# Patient Record
Sex: Female | Born: 1975 | Race: Black or African American | Hispanic: No | Marital: Married | Smoking: Never smoker
Health system: Southern US, Community
[De-identification: ages and names within clinical notes are randomized; demographics above are authoritative.]

## PROBLEM LIST (undated history)

## (undated) DIAGNOSIS — G500 Trigeminal neuralgia: Secondary | ICD-10-CM

## (undated) DIAGNOSIS — M1990 Unspecified osteoarthritis, unspecified site: Secondary | ICD-10-CM

## (undated) DIAGNOSIS — R51 Headache: Secondary | ICD-10-CM

## (undated) DIAGNOSIS — K8020 Calculus of gallbladder without cholecystitis without obstruction: Secondary | ICD-10-CM

## (undated) DIAGNOSIS — G4730 Sleep apnea, unspecified: Secondary | ICD-10-CM

## (undated) HISTORY — PX: ABDOMINAL HYSTERECTOMY: SHX81

## (undated) HISTORY — PX: TONSILLECTOMY: SUR1361

## (undated) HISTORY — PX: BRAIN SURGERY: SHX531

## (undated) HISTORY — PX: BREAST REDUCTION SURGERY: SHX8

---

## 2003-08-06 ENCOUNTER — Emergency Department (HOSPITAL_COMMUNITY): Admission: EM | Admit: 2003-08-06 | Discharge: 2003-08-06 | Payer: Self-pay | Admitting: Emergency Medicine

## 2004-12-15 ENCOUNTER — Emergency Department (HOSPITAL_COMMUNITY): Admission: EM | Admit: 2004-12-15 | Discharge: 2004-12-15 | Payer: Self-pay | Admitting: Family Medicine

## 2004-12-21 ENCOUNTER — Emergency Department (HOSPITAL_COMMUNITY): Admission: EM | Admit: 2004-12-21 | Discharge: 2004-12-21 | Payer: Self-pay | Admitting: Family Medicine

## 2005-05-01 ENCOUNTER — Encounter: Admission: RE | Admit: 2005-05-01 | Discharge: 2005-05-01 | Payer: Self-pay | Admitting: Internal Medicine

## 2006-12-01 ENCOUNTER — Emergency Department (HOSPITAL_COMMUNITY): Admission: EM | Admit: 2006-12-01 | Discharge: 2006-12-01 | Payer: Self-pay | Admitting: Family Medicine

## 2007-06-28 ENCOUNTER — Emergency Department (HOSPITAL_COMMUNITY): Admission: EM | Admit: 2007-06-28 | Discharge: 2007-06-28 | Payer: Self-pay | Admitting: Emergency Medicine

## 2008-07-01 ENCOUNTER — Emergency Department (HOSPITAL_COMMUNITY): Admission: EM | Admit: 2008-07-01 | Discharge: 2008-07-01 | Payer: Self-pay | Admitting: Emergency Medicine

## 2009-01-29 ENCOUNTER — Emergency Department (HOSPITAL_COMMUNITY): Admission: EM | Admit: 2009-01-29 | Discharge: 2009-01-30 | Payer: Self-pay | Admitting: Emergency Medicine

## 2009-02-19 ENCOUNTER — Emergency Department (HOSPITAL_COMMUNITY): Admission: EM | Admit: 2009-02-19 | Discharge: 2009-02-19 | Payer: Self-pay | Admitting: Emergency Medicine

## 2011-01-08 ENCOUNTER — Emergency Department (HOSPITAL_COMMUNITY)
Admission: EM | Admit: 2011-01-08 | Discharge: 2011-01-08 | Disposition: A | Discharge status: Home / Self Care | Payer: 59 | Attending: Emergency Medicine | Admitting: Emergency Medicine

## 2011-01-08 DIAGNOSIS — R51 Headache: Secondary | ICD-10-CM | POA: Insufficient documentation

## 2011-01-08 DIAGNOSIS — R4789 Other speech disturbances: Secondary | ICD-10-CM | POA: Insufficient documentation

## 2011-05-14 LAB — BASIC METABOLIC PANEL
CO2: 29
Chloride: 108
Potassium: 3.8
Sodium: 141

## 2011-05-14 LAB — DIFFERENTIAL
Basophils Absolute: 0
Basophils Relative: 0
Eosinophils Relative: 3
Lymphocytes Relative: 29
Lymphs Abs: 2.4
Neutro Abs: 5.5

## 2011-05-14 LAB — CBC
MCV: 81.4
Platelets: 313
WBC: 8.5

## 2011-05-14 LAB — POCT CARDIAC MARKERS: Troponin i, poc: <0.05

## 2012-08-27 ENCOUNTER — Emergency Department (HOSPITAL_COMMUNITY): Payer: 59

## 2012-08-27 ENCOUNTER — Encounter (HOSPITAL_COMMUNITY): Payer: Self-pay | Admitting: Emergency Medicine

## 2012-08-27 ENCOUNTER — Emergency Department (HOSPITAL_COMMUNITY)
Admission: EM | Admit: 2012-08-27 | Discharge: 2012-08-27 | Disposition: A | Discharge status: Home / Self Care | Payer: 59 | Attending: Emergency Medicine | Admitting: Emergency Medicine

## 2012-08-27 DIAGNOSIS — Z8669 Personal history of other diseases of the nervous system and sense organs: Secondary | ICD-10-CM | POA: Insufficient documentation

## 2012-08-27 DIAGNOSIS — R05 Cough: Secondary | ICD-10-CM | POA: Insufficient documentation

## 2012-08-27 DIAGNOSIS — B349 Viral infection, unspecified: Secondary | ICD-10-CM

## 2012-08-27 DIAGNOSIS — B9789 Other viral agents as the cause of diseases classified elsewhere: Secondary | ICD-10-CM | POA: Insufficient documentation

## 2012-08-27 DIAGNOSIS — J3489 Other specified disorders of nose and nasal sinuses: Secondary | ICD-10-CM | POA: Insufficient documentation

## 2012-08-27 DIAGNOSIS — R6889 Other general symptoms and signs: Secondary | ICD-10-CM | POA: Insufficient documentation

## 2012-08-27 DIAGNOSIS — J029 Acute pharyngitis, unspecified: Secondary | ICD-10-CM | POA: Insufficient documentation

## 2012-08-27 DIAGNOSIS — IMO0001 Reserved for inherently not codable concepts without codable children: Secondary | ICD-10-CM | POA: Insufficient documentation

## 2012-08-27 DIAGNOSIS — R059 Cough, unspecified: Secondary | ICD-10-CM | POA: Insufficient documentation

## 2012-08-27 HISTORY — DX: Trigeminal neuralgia: G50.0

## 2012-08-27 MED ORDER — ACETAMINOPHEN 500 MG PO TABS
1000.0000 mg | ORAL_TABLET | Freq: Once | ORAL | Status: AC
  Administered 2012-08-27: 1000 mg via ORAL
  Filled 2012-08-27: qty 2

## 2012-08-27 MED ORDER — HYDROCODONE-ACETAMINOPHEN 7.5-500 MG/15ML PO SOLN: 15.0000 mL | ORAL | Status: DC | PRN

## 2012-08-27 MED ORDER — HYDROMORPHONE HCL PF 1 MG/ML IJ SOLN
1.0000 mg | Freq: Once | INTRAMUSCULAR | Status: AC
  Administered 2012-08-27: 1 mg via INTRAMUSCULAR
  Filled 2012-08-27: qty 1

## 2012-08-27 MED ORDER — IBUPROFEN 200 MG PO TABS
400.0000 mg | ORAL_TABLET | Freq: Once | ORAL | Status: AC
  Administered 2012-08-27: 400 mg via ORAL
  Filled 2012-08-27: qty 2

## 2012-08-27 MED ORDER — DEXAMETHASONE 10 MG/ML FOR PEDIATRIC ORAL USE
10.0000 mg | Freq: Once | INTRAMUSCULAR | Status: DC
  Filled 2012-08-27: qty 1

## 2012-08-27 MED ORDER — DEXAMETHASONE 6 MG PO TABS
10.0000 mg | ORAL_TABLET | Freq: Once | ORAL | Status: AC
  Administered 2012-08-27: 10 mg via ORAL
  Filled 2012-08-27: qty 1

## 2012-08-27 NOTE — ED Notes (Signed)
States that she has had a cold for the past 2 weeks. States that her throat is hurting today and feels as though it is closed. States that she started having body aches and chills 2 days ago.

## 2012-08-27 NOTE — ED Provider Notes (Signed)
History     CSN: [id number]  Arrival date & time 08/27/12  1617   First MD Initiated Contact with Patient 08/27/12 1702      Chief Complaint  Patient presents with  . Influenza     HPI Pt was seen at 1705.   Per pt, c/o gradual onset and persistence of constant runny/stuffy nose, sinus congestion, and cough for the past 2 weeks.  Has now been associated with sore throat, generalized body aches/fatigue, and home fevers to "103" for the past 2 days.  Denies rash, no CP/SOB, no N/V/D, no abd pain.    Did not receive flu shot this year Past Medical History  Diagnosis Date  . Trigeminal neuralgia     Past Surgical History  Procedure Date  . Abdominal hysterectomy   . Breast reduction surgery      History  Substance Use Topics  . Smoking status: Never Smoker   . Smokeless tobacco: Not on file  . Alcohol Use: No    Review of Systems ROS: Statement: All systems negative except as marked or noted in the HPI; Constitutional: +fever and chills, generalized body aches/fatigue. ; ; Eyes: Negative for eye pain, redness and discharge. ; ; ENMT: Negative for ear pain, hoarseness, +nasal congestion, sinus pressure and sore throat. ; ; Cardiovascular: Negative for chest pain, palpitations, diaphoresis, dyspnea and peripheral edema. ; ; Respiratory: +cough. Negative for wheezing and stridor. ; ; Gastrointestinal: Negative for nausea, vomiting, diarrhea, abdominal pain, blood in stool, hematemesis, jaundice and rectal bleeding. . ; ; Genitourinary: Negative for dysuria, flank pain and hematuria. ; ; Musculoskeletal: Negative for back pain and neck pain. Negative for swelling and trauma.; ; Skin: Negative for pruritus, rash, abrasions, blisters, bruising and skin lesion.; ; Neuro: Negative for headache, lightheadedness and neck stiffness. Negative for weakness, altered level of consciousness , altered mental status, extremity weakness, paresthesias, involuntary movement, seizure and syncope.        Allergies  Review of patient's allergies indicates no known allergies.  Home Medications   Current Outpatient Rx  Name  Route  Sig  Dispense  Refill  . ACETAMINOPHEN 500 MG PO TABS   Oral   Take 1,000 mg by mouth every 6 (six) hours as needed. Pain.         Marland Kitchen DEXTROMETHORPHAN POLISTIREX ER 30 MG/5ML PO LQCR   Oral   Take 60 mg by mouth as needed. Cough.         Marland Kitchen PHENYLEPHRINE-DM-GG-APAP 5-10-200-325 MG/10ML PO LIQD   Oral   Take 30 mLs by mouth 4 (four) times daily as needed. Cold/flu symptoms.         Marland Kitchen PSEUDOEPH-DOXYLAMINE-DM-APAP 60-7.01-01-999 MG/30ML PO LIQD   Oral   Take 30 mLs by mouth daily as needed. Cold symptoms.           BP 144/74  Pulse 99  Temp 98.5 F (36.9 C) (Oral)  Resp 19  SpO2 100%  Physical Exam 1710: Physical examination:  Nursing notes reviewed; Vital signs and O2 SAT reviewed;  Constitutional: Well developed, Well nourished, Well hydrated, In no acute distress; Head:  Normocephalic, atraumatic; Eyes: EOMI, PERRL, No scleral icterus; ENMT: TM's clear bilat. +edemetous nasal turbinates bilat with clear rhinorrhea.  +mild post pharyngeal erythema. No exudates. Mouth and pharynx without lesions. No intra-oral edema. No hoarse voice, no drooling, no stridor. No pain with manipulation of larynx. Mucous membranes moist; Neck: Supple, Full range of motion, No lymphadenopathy; Cardiovascular: Regular rate and rhythm, No murmur,  rub, or gallop; Respiratory: Breath sounds clear & equal bilaterally, No rales, rhonchi, wheezes.  Speaking full sentences with ease, Normal respiratory effort/excursion; Chest: Nontender, Movement normal; Abdomen: Soft, Nontender, Nondistended, Normal bowel sounds; Genitourinary: No CVA tenderness; Extremities: Pulses normal, No tenderness, No edema, No calf edema or asymmetry.; Neuro: AA&Ox3, Major CN grossly intact.  Speech clear. Climbs on and off stretcher easily by herself. Gait steady. No gross focal motor or sensory  deficits in extremities.; Skin: Color normal, Warm, Dry.   ED Course  Procedures     MDM  MDM Reviewed: nursing note and vitals Interpretation: labs and x-ray   Results for orders placed during the hospital encounter of 08/27/12  RAPID STREP SCREEN      Component Value Range   Streptococcus, Group A Screen (Direct) NEGATIVE  NEGATIVE   Dg Chest 2 View 08/27/2012  *RADIOLOGY REPORT*  Clinical Data: Pharyngitis.  Myalgias.  Headaches.  Neck pain.  CHEST - 2 VIEW  Comparison: 06/28/2007.  Findings: The heart remains normal in size and the lungs are clear. Mild diffuse peribronchial thickening is currently demonstrated. Interval bilateral breast reductions.  Unremarkable bones.  IMPRESSION: Interval mild bronchitic changes.   Original Report Authenticated By: Beckie Salts, M.D.     [phone]:   Appears viral illness at this time; will tx symptomatically. Dx and testing d/w pt.  Questions answered.  Verb understanding, agreeable to d/c home with outpt f/u.          Laray Anger, DO 08/29/12 2143

## 2012-08-30 ENCOUNTER — Emergency Department (HOSPITAL_COMMUNITY)
Admission: EM | Admit: 2012-08-30 | Discharge: 2012-08-30 | Disposition: A | Discharge status: Home / Self Care | Payer: 59 | Attending: Emergency Medicine | Admitting: Emergency Medicine

## 2012-08-30 ENCOUNTER — Encounter (HOSPITAL_COMMUNITY): Payer: Self-pay | Admitting: Emergency Medicine

## 2012-08-30 DIAGNOSIS — J029 Acute pharyngitis, unspecified: Secondary | ICD-10-CM

## 2012-08-30 DIAGNOSIS — R509 Fever, unspecified: Secondary | ICD-10-CM | POA: Insufficient documentation

## 2012-08-30 DIAGNOSIS — G500 Trigeminal neuralgia: Secondary | ICD-10-CM | POA: Insufficient documentation

## 2012-08-30 MED ORDER — DEXAMETHASONE 6 MG PO TABS
12.0000 mg | ORAL_TABLET | Freq: Once | ORAL | Status: AC
  Administered 2012-08-30: 12 mg via ORAL
  Filled 2012-08-30: qty 2

## 2012-08-30 MED ORDER — TRAMADOL HCL 50 MG PO TABS: 50.0000 mg | ORAL_TABLET | Freq: Four times a day (QID) | ORAL | Status: DC | PRN

## 2012-08-30 MED ORDER — IBUPROFEN 200 MG PO TABS
600.0000 mg | ORAL_TABLET | Freq: Once | ORAL | Status: AC
  Administered 2012-08-30: 600 mg via ORAL
  Filled 2012-08-30: qty 3

## 2012-08-30 NOTE — ED Notes (Signed)
Pt reports increased URI symptoms over last two weeks. C/o sore throat, ear pain, chills and fever. Feels like throat is closing over last few days.

## 2012-09-04 ENCOUNTER — Encounter: Payer: Self-pay | Admitting: Physician Assistant

## 2012-09-04 ENCOUNTER — Ambulatory Visit: Payer: 59 | Admitting: Physician Assistant

## 2012-09-04 VITALS — BP 154/88 | HR 129 | Temp 99.3°F | Resp 17 | Ht 67.5 in | Wt >= 6400 oz

## 2012-09-04 DIAGNOSIS — R509 Fever, unspecified: Secondary | ICD-10-CM

## 2012-09-04 DIAGNOSIS — J029 Acute pharyngitis, unspecified: Secondary | ICD-10-CM

## 2012-09-04 LAB — POCT CBC
MCHC: 30.7 g/dL — AB (ref 31.8–35.4)
POC Granulocyte: 8 — AB (ref 2–6.9)
POC LYMPH PERCENT: 30.2 %L (ref 10–50)
POC MID %: 5.4 %M (ref 0–12)
WBC: 12.6 10*3/uL — AB (ref 4.6–10.2)

## 2012-09-04 LAB — POCT RAPID STREP A (OFFICE): Rapid Strep A Screen: NEGATIVE

## 2012-09-04 MED ORDER — FIRST-DUKES MOUTHWASH MT SUSP: 10.0000 mL | OROMUCOSAL | Status: DC | PRN

## 2012-09-04 MED ORDER — AMOXICILLIN 875 MG PO TABS: 875.0000 mg | ORAL_TABLET | Freq: Two times a day (BID) | ORAL | Status: DC

## 2012-09-04 MED ORDER — IPRATROPIUM BROMIDE 0.03 % NA SOLN: 2.0000 | Freq: Two times a day (BID) | NASAL | Status: DC

## 2012-09-04 MED ORDER — GUAIFENESIN ER 1200 MG PO TB12: 1.0000 | ORAL_TABLET | Freq: Two times a day (BID) | ORAL | Status: DC | PRN

## 2012-09-04 MED ORDER — CETIRIZINE HCL 10 MG PO TABS: 10.0000 mg | ORAL_TABLET | Freq: Every day | ORAL | Status: DC

## 2012-09-04 NOTE — Progress Notes (Signed)
Subjective:    Patient ID: Phyllis Calhoun, female    DOB: 1976/04/07, 37 y.o.   MRN: [id number]  HPI   Ms. Phyllis Calhoun is a 37 yr old female who presents with sore throat and a sensation of the throat closing.  States this has been going on for about 2 weeks.  Was seen at [facility] ED for these symptoms on 08/27/12.  Note reviewed.  At that time diagnosed with viral illness, possibly flu, though flu test not done.  Rapid strep negative.  Pt states that she has not improved and is in fact probably worsening.  States the throat feels like it's closing, especially at night.  Gagging because she feels like she can't breathe.  She denies PND.  Endorses SOB and wheezing.  Swallowing is painful but not difficult.  Denies drooling.  Does endorse change in voice.  States she has had fevers at home.  Tmax 4 days ago 102.13F, doesnt't think she's had fevers since that time.  States that her other symptoms of runny nose and ear pain have resolved.  Denies cough.  Denies GI or urinary symptoms.      Review of Systems  Constitutional: Positive for fever.  HENT: Positive for sore throat, neck pain and voice change. Negative for drooling, trouble swallowing, postnasal drip and sinus pressure.   Respiratory: Positive for shortness of breath and wheezing. Negative for cough.   Cardiovascular: Negative.   Gastrointestinal: Negative.   Skin: Negative.   Neurological: Negative.        Objective:   Physical Exam  Vitals reviewed. Constitutional: She is oriented to person, place, and time. She appears well-developed and well-nourished. No distress.  HENT:  Head: Normocephalic and atraumatic. No trismus in the jaw.  Right Ear: Tympanic membrane and ear canal normal.  Left Ear: Tympanic membrane and ear canal normal.  Nose: Mucosal edema present. Right sinus exhibits no maxillary sinus tenderness and no frontal sinus tenderness. Left sinus exhibits no maxillary sinus tenderness and no frontal sinus tenderness.   Mouth/Throat: Uvula is midline and mucous membranes are normal. No uvula swelling. No oropharyngeal exudate, posterior oropharyngeal edema, posterior oropharyngeal erythema or tonsillar abscesses.       Copious post-nasal drainage visible on exam; no erythema, edema, or exudate; no PTA  Neck: Neck supple.  Cardiovascular: Regular rhythm and normal heart sounds.  Tachycardia present.  Exam reveals no gallop and no friction rub.   No murmur heard. Pulmonary/Chest: Effort normal and breath sounds normal. She has no decreased breath sounds. She has no wheezes. She has no rales.  Abdominal: Soft. Bowel sounds are normal. There is no tenderness. There is no rebound and no guarding.  Lymphadenopathy:    She has no cervical adenopathy.  Neurological: She is alert and oriented to person, place, and time.  Skin: Skin is warm and dry.  Psychiatric: She has a normal mood and affect. Her behavior is normal.      Filed Vitals:   09/04/12 1506  BP: 154/88  Pulse: 129  Temp: 99.3 F (37.4 C)  Resp: 17      Results for orders placed in visit on 09/04/12  POCT CBC      Component Value Range   WBC 12.6 (*) 4.6 - 10.2 K/uL   Lymph, poc 3.8 (*) 0.6 - 3.4   POC LYMPH PERCENT 30.2  10 - 50 %L   MID (cbc) 0.7  0 - 0.9   POC MID % 5.4  0 - 12 %M  POC Granulocyte 8.0 (*) 2 - 6.9   Granulocyte percent 64.4  37 - 80 %G   RBC 4.64  4.04 - 5.48 M/uL   Hemoglobin 12.2  12.2 - 16.2 g/dL   HCT, POC 16.1  09.6 - 47.9 %   MCV 85.7  80 - 97 fL   MCH, POC 26.3 (*) 27 - 31.2 pg   MCHC 30.7 (*) 31.8 - 35.4 g/dL   RDW, POC 04.5     Platelet Count, POC 421  142 - 424 K/uL   MPV 9.3  0 - 99.8 fL  POCT RAPID STREP A (OFFICE)      Component Value Range   Rapid Strep A Screen Negative  Negative       Assessment & Plan:   1. Acute pharyngitis  POCT CBC, POCT rapid strep A, Culture, Group A Strep, amoxicillin (AMOXIL) 875 MG tablet, Diphenhyd-Hydrocort-Nystatin (FIRST-DUKES MOUTHWASH) SUSP, Guaifenesin  (MUCINEX MAXIMUM STRENGTH) 1200 MG TB12, ipratropium (ATROVENT) 0.03 % nasal spray, cetirizine (ZYRTEC) 10 MG tablet  2. Fever  amoxicillin (AMOXIL) 875 MG tablet     Ms. Phyllis Calhoun is a 37 yr old female here with pharyngitis.  Temp is 99.98F currently, CBC shows a slightly elevated white count at 12.6.  Rapid strep is negative.  Throat culture sent.  On exam, the throat is remarkable only for post-nasal drainage.  Suspect that PND is contributing to her sensation of throat closing and gagging.  Will try Atrovent and Zyrtec for PND.  Magic Mouthwash and ibuprofen for throat pain.  Plain mucinex.  Encouraged her to discontinue decongestants as she has an elevated BP today.  Given her fevers and slight leukocytosis, will go ahead and cover her with amoxicillin for 10 days.  Discussed plan with pt who understands and is in agreement.  Discussed RTC precautions.

## 2012-09-04 NOTE — Patient Instructions (Addendum)
Begin taking the amoxicillin as directed.  Take with food to reduce stomach upset.  Be sure to finish the full course.  I will let you know what your throat culture reveals.  Use Magic Mouth was as frequently as every two hours for sore throat.  Also take 600mg  ibuprofen ever 8 hours for sore throat.  STOP taking the Mucinex cold prep that you have at home.  Start taking the mucinex that I prescribed.  Begin taking cetirizine daily - this will help with the drainage at the back of your throat.  Also begin using Atrovent nasal spray twice daily to help with the drainage and nasal congestion.  Please let us know if you are worsening or not improving.

## 2012-09-06 LAB — CULTURE, GROUP A STREP

## 2012-09-08 NOTE — ED Provider Notes (Signed)
History    37 year-old female with sore throat. Patient has been feeling "bad" for the past 2 weeks, but symptoms have worsened the last few days. Subjective fever. Bilaterally her pain, left worse than right. Recent ER evaluation for same. No chest pain or shortness of breath. No nausea or vomiting. No sick contacts. CSN: [id number]  Arrival date & time 08/30/12  5284   First MD Initiated Contact with Patient 08/30/12 [phone]      Chief Complaint  Patient presents with  . Sore Throat    pt reports pressure and pain in throat  . Fever    fever has increased to 103 over last few days  . Chills    (Consider location/radiation/quality/duration/timing/severity/associated sxs/prior treatment) HPI  Past Medical History  Diagnosis Date  . Trigeminal neuralgia     Past Surgical History  Procedure Date  . Abdominal hysterectomy   . Breast reduction surgery     No family history on file.  History  Substance Use Topics  . Smoking status: Never Smoker   . Smokeless tobacco: Not on file  . Alcohol Use: No    OB History    Grav Para Term Preterm Abortions TAB SAB Ect Mult Living                  Review of Systems  All systems reviewed and negative, other than as noted in HPI.   Allergies  Review of patient's allergies indicates no known allergies.  Home Medications   Current Outpatient Rx  Name  Route  Sig  Dispense  Refill  . ACETAMINOPHEN 500 MG PO TABS   Oral   Take 1,000 mg by mouth every 6 (six) hours as needed. Pain.         Marland Kitchen AMOXICILLIN 875 MG PO TABS   Oral   Take 1 tablet (875 mg total) by mouth 2 (two) times daily.   20 tablet   0   . CETIRIZINE HCL 10 MG PO TABS   Oral   Take 1 tablet (10 mg total) by mouth daily.   30 tablet   11   . FIRST-DUKES MOUTHWASH MT SUSP   Oral   Take 10 mLs by mouth every 2 (two) hours as needed. With 1:1 ratio viscous lidocaine   360 mL   0   . GUAIFENESIN ER 1200 MG PO TB12   Oral   Take 1 tablet (1,200 mg  total) by mouth every 12 (twelve) hours as needed.   14 tablet   1   . IPRATROPIUM BROMIDE 0.03 % NA SOLN   Nasal   Place 2 sprays into the nose 2 (two) times daily.   30 mL   1   . PHENYLEPHRINE-DM-GG-APAP 5-10-200-325 MG/10ML PO LIQD   Oral   Take 30 mLs by mouth 4 (four) times daily as needed. Cold/flu symptoms.           BP 113/69  Pulse 91  Temp 99.6 F (37.6 C) (Oral)  Resp 18  SpO2 98%  Physical Exam  Nursing note and vitals reviewed. Constitutional: She appears well-developed and well-nourished. No distress.  HENT:  Head: Normocephalic and atraumatic.       Pharyngitis. Uvula is midline. Handling secretions. Normal phonation. No tongue elevation. Submental tissues are soft. Neck supple. No adenopathy. No stridor  Eyes: Conjunctivae normal are normal. Right eye exhibits no discharge. Left eye exhibits no discharge.  Neck: Neck supple.  Cardiovascular: Normal rate, regular rhythm and normal heart  sounds.  Exam reveals no gallop and no friction rub.   No murmur heard. Pulmonary/Chest: Effort normal and breath sounds normal. No respiratory distress.  Abdominal: Soft. She exhibits no distension. There is no tenderness.  Musculoskeletal: She exhibits no edema and no tenderness.  Neurological: She is alert.  Skin: Skin is warm and dry.  Psychiatric: She has a normal mood and affect. Her behavior is normal. Thought content normal.    ED Course  Procedures (including critical care time)  Labs Reviewed - No data to display No results found.   1. Viral pharyngitis       MDM  37 year old female with a sore throat. Likely viral. Patient with only one out of 4 centor criteria to include his subjective fever. She is afebrile in the Ed though. No evidence of deep space head or neck infection. Plan symptomatic treatment. Emergent return precautions were discussed.        Raeford Razor, MD 09/08/12 [phone]

## 2012-09-11 ENCOUNTER — Telehealth: Payer: Self-pay | Admitting: Family Medicine

## 2012-09-11 MED ORDER — FLUCONAZOLE 150 MG PO TABS: 150.0000 mg | ORAL_TABLET | Freq: Once | ORAL | Status: DC

## 2012-09-11 NOTE — Telephone Encounter (Signed)
Done, sent to pharmacy

## 2012-09-11 NOTE — Telephone Encounter (Signed)
Patient would like an RX for yeast infection that she developed on Tuesday she started her antibiotic Amoxicillin on Friday had diarrhea on Monday and got a yeast infection on Tuesday please call [facility] on [address] for RX she also wants to know if its normal to have diarrhea i told her that one of the symptoms while on Amox please call her at [phone]

## 2012-09-11 NOTE — Telephone Encounter (Signed)
Patient called back because she want to know what should she do about the Diarrhea that she is having it started on Sunday and its watery only have abdominal pain when she has to go she goes to the bathroom about 4-5 times a  Per Sarah day she started her Amox. On Friday per sarah She can stay on Amox if she can tolerate it and take some Immodium

## 2012-10-11 ENCOUNTER — Emergency Department (HOSPITAL_COMMUNITY): Payer: 59

## 2012-10-11 ENCOUNTER — Encounter (HOSPITAL_COMMUNITY): Payer: Self-pay | Admitting: Emergency Medicine

## 2012-10-11 ENCOUNTER — Emergency Department (HOSPITAL_COMMUNITY)
Admission: EM | Admit: 2012-10-11 | Discharge: 2012-10-11 | Disposition: A | Discharge status: Home / Self Care | Payer: 59 | Attending: Emergency Medicine | Admitting: Emergency Medicine

## 2012-10-11 DIAGNOSIS — IMO0002 Reserved for concepts with insufficient information to code with codable children: Secondary | ICD-10-CM | POA: Insufficient documentation

## 2012-10-11 DIAGNOSIS — Y92009 Unspecified place in unspecified non-institutional (private) residence as the place of occurrence of the external cause: Secondary | ICD-10-CM | POA: Insufficient documentation

## 2012-10-11 DIAGNOSIS — Z8669 Personal history of other diseases of the nervous system and sense organs: Secondary | ICD-10-CM | POA: Insufficient documentation

## 2012-10-11 DIAGNOSIS — T148XXA Other injury of unspecified body region, initial encounter: Secondary | ICD-10-CM

## 2012-10-11 DIAGNOSIS — X58XXXA Exposure to other specified factors, initial encounter: Secondary | ICD-10-CM | POA: Insufficient documentation

## 2012-10-11 DIAGNOSIS — Y939 Activity, unspecified: Secondary | ICD-10-CM | POA: Insufficient documentation

## 2012-10-11 LAB — BASIC METABOLIC PANEL
BUN: 11 mg/dL (ref 6–23)
GFR calc non Af Amer: >90 mL/min (ref >90)
Glucose, Bld: 103 mg/dL — ABNORMAL HIGH (ref 70–99)

## 2012-10-11 LAB — CBC WITH DIFFERENTIAL/PLATELET
Basophils Relative: 1 % (ref 0–1)
HCT: 37.1 % (ref 36.0–46.0)
Lymphs Abs: 3.1 10*3/uL (ref 0.7–4.0)
MCH: 27.1 pg (ref 26.0–34.0)
Monocytes Relative: 5 % (ref 3–12)
Neutrophils Relative %: 52 % (ref 43–77)
Platelets: 302 10*3/uL (ref 150–400)
RBC: 4.43 MIL/uL (ref 3.87–5.11)
RDW: 16.1 % — ABNORMAL HIGH (ref 11.5–15.5)

## 2012-10-11 LAB — URINALYSIS, ROUTINE W REFLEX MICROSCOPIC
Ketones, ur: NEGATIVE mg/dL
Leukocytes, UA: NEGATIVE
Protein, ur: NEGATIVE mg/dL
Urobilinogen, UA: 0.2 mg/dL (ref 0.0–1.0)
pH: 6 (ref 5.0–8.0)

## 2012-10-11 MED ORDER — IBUPROFEN 600 MG PO TABS: 600.0000 mg | ORAL_TABLET | Freq: Four times a day (QID) | ORAL | Status: DC | PRN

## 2012-10-11 MED ORDER — METHOCARBAMOL 750 MG PO TABS: 750.0000 mg | ORAL_TABLET | Freq: Four times a day (QID) | ORAL | Status: DC

## 2012-10-11 NOTE — ED Notes (Signed)
Pt alert, arrives from home, c/o right flank pain, onset was several days ago, states pain sharp, constant, denies changes in bowel or bladder, resp even unlabored, skin pwd

## 2012-10-11 NOTE — ED Provider Notes (Signed)
History     CSN: [id number]  Arrival date & time 10/11/12  0019   First MD Initiated Contact with Patient 10/11/12 0703      Chief Complaint  Patient presents with  . Flank Pain    (Consider location/radiation/quality/duration/timing/severity/associated sxs/prior treatment) Patient is a 37 y.o. female presenting with flank pain. The history is provided by the patient.  Flank Pain   patient here complaining of left-sided flank pain x1 month characterized as colicky. Symptoms are worse in the evening and better in the morning. No urinary symptoms associated with this. Denies any fever or chills. Pain is sharp and worse with certain positions as well. She denies any dyspnea or cough. Has taken no medications for this. Symptoms became worse last night  Past Medical History  Diagnosis Date  . Trigeminal neuralgia     Past Surgical History  Procedure Laterality Date  . Abdominal hysterectomy    . Breast reduction surgery      No family history on file.  History  Substance Use Topics  . Smoking status: Never Smoker   . Smokeless tobacco: Not on file  . Alcohol Use: No    OB History   Grav Para Term Preterm Abortions TAB SAB Ect Mult Living                  Review of Systems  Genitourinary: Positive for flank pain.  All other systems reviewed and are negative.    Allergies  Review of patient's allergies indicates no known allergies.  Home Medications   Current Outpatient Rx  Name  Route  Sig  Dispense  Refill  . acetaminophen (TYLENOL) 500 MG tablet   Oral   Take 1,000 mg by mouth every 6 (six) hours as needed for pain. Pain.         Marland Kitchen ibuprofen (ADVIL,MOTRIN) 200 MG tablet   Oral   Take 400 mg by mouth every 6 (six) hours as needed for pain.           BP 126/52  Pulse 66  Temp(Src) 98.1 F (36.7 C) (Oral)  Resp 18  SpO2 100%  Physical Exam  Nursing note and vitals reviewed. Constitutional: She is oriented to person, place, and time. She  appears well-developed and well-nourished.  Non-toxic appearance. No distress.  HENT:  Head: Normocephalic and atraumatic.  Eyes: Conjunctivae, EOM and lids are normal. Pupils are equal, round, and reactive to light.  Neck: Normal range of motion. Neck supple. No tracheal deviation present. No mass present.  Cardiovascular: Normal rate, regular rhythm and normal heart sounds.  Exam reveals no gallop.   No murmur heard. Pulmonary/Chest: Effort normal and breath sounds normal. No stridor. No respiratory distress. She has no decreased breath sounds. She has no wheezes. She has no rhonchi. She has no rales.  Abdominal: Soft. Normal appearance and bowel sounds are normal. She exhibits no distension. There is no tenderness. There is no rebound and no CVA tenderness.  Musculoskeletal: Normal range of motion. She exhibits no edema and no tenderness.       Arms: Neurological: She is alert and oriented to person, place, and time. She has normal strength. No cranial nerve deficit or sensory deficit. GCS eye subscore is 4. GCS verbal subscore is 5. GCS motor subscore is 6.  Skin: Skin is warm and dry. No abrasion and no rash noted.  Psychiatric: She has a normal mood and affect. Her speech is normal and behavior is normal.    ED  Course  Procedures (including critical care time)  Labs Reviewed  CBC WITH DIFFERENTIAL - Abnormal; Notable for the following:    RDW 16.1 (*)    All other components within normal limits  BASIC METABOLIC PANEL - Abnormal; Notable for the following:    Glucose, Bld 103 (*)    All other components within normal limits  URINALYSIS, ROUTINE W REFLEX MICROSCOPIC   No results found.   No diagnosis found.    MDM  Patient with likely musculoskeletal pain. No concern for PE or other serious pathology. Will place patient on pain meds and muscle relaxants        Toy Baker, MD 10/11/12 [phone]

## 2013-05-03 ENCOUNTER — Other Ambulatory Visit: Payer: Self-pay

## 2013-05-03 ENCOUNTER — Ambulatory Visit
Admission: RE | Admit: 2013-05-03 | Discharge: 2013-05-03 | Disposition: A | Discharge status: Home / Self Care | Payer: 59 | Source: Ambulatory Visit

## 2013-05-03 DIAGNOSIS — R109 Unspecified abdominal pain: Secondary | ICD-10-CM

## 2013-05-06 ENCOUNTER — Ambulatory Visit (INDEPENDENT_AMBULATORY_CARE_PROVIDER_SITE_OTHER): Payer: 59 | Admitting: General Surgery

## 2013-05-06 ENCOUNTER — Telehealth (INDEPENDENT_AMBULATORY_CARE_PROVIDER_SITE_OTHER): Payer: Self-pay | Admitting: General Surgery

## 2013-05-06 ENCOUNTER — Encounter (INDEPENDENT_AMBULATORY_CARE_PROVIDER_SITE_OTHER): Payer: Self-pay | Admitting: General Surgery

## 2013-05-06 VITALS — BP 122/88 | HR 80 | Resp 16 | Ht 67.5 in | Wt 377.2 lb

## 2013-05-06 DIAGNOSIS — K8020 Calculus of gallbladder without cholecystitis without obstruction: Secondary | ICD-10-CM

## 2013-05-06 NOTE — Telephone Encounter (Signed)
Went over pt financial responsibilities and she will call to schedule surgery. Placed in pending folder

## 2013-05-06 NOTE — Progress Notes (Signed)
Patient ID: Phyllis Calhoun, female   DOB: 02/10/1976, 37 y.o.   MRN: [id number]  No chief complaint on file.   HPI Phyllis Calhoun is a 37 y.o. female.  The patient is a 37 year old female who is referred by Dr. Kateri Plummer for evaluation of symptomatic cholelithiasis. Patient states she's had right upper quadrant pain for the last 3-4 months. They have been becoming more frequent, and the last episode lasted approximately 4-5 days. The patient said ultrasound which reveals multiple gallstones within the gallbladder. Patient labs in the ER have been normal.  HPI  Past Medical History  Diagnosis Date  . Trigeminal neuralgia     Past Surgical History  Procedure Laterality Date  . Abdominal hysterectomy    . Breast reduction surgery      No family history on file.  Social History History  Substance Use Topics  . Smoking status: Never Smoker   . Smokeless tobacco: Not on file  . Alcohol Use: No    No Known Allergies  Current Outpatient Prescriptions  Medication Sig Dispense Refill  . acetaminophen (TYLENOL) 500 MG tablet Take 1,000 mg by mouth every 6 (six) hours as needed for pain. Pain.      Marland Kitchen ibuprofen (ADVIL,MOTRIN) 200 MG tablet Take 400 mg by mouth every 6 (six) hours as needed for pain.      Marland Kitchen ibuprofen (ADVIL,MOTRIN) 600 MG tablet Take 1 tablet (600 mg total) by mouth every 6 (six) hours as needed for pain.  30 tablet  0  . methocarbamol (ROBAXIN-750) 750 MG tablet Take 1 tablet (750 mg total) by mouth 4 (four) times daily.  30 tablet  0   No current facility-administered medications for this visit.    Review of Systems Review of Systems  Constitutional: Negative.   HENT: Negative.   Respiratory: Negative.   Cardiovascular: Negative.   Gastrointestinal: Positive for abdominal pain.  Neurological: Negative.     Blood pressure 122/88, pulse 80, resp. rate 16, height 5' 7.5" (1.715 m), weight 377 lb 3.2 oz (171.097 kg).  Physical Exam Physical Exam   Constitutional: She is oriented to person, place, and time. She appears well-developed and well-nourished.  HENT:  Head: Normocephalic and atraumatic.  Eyes: Conjunctivae and EOM are normal. Pupils are equal, round, and reactive to light.  Neck: Normal range of motion. Neck supple.  Cardiovascular: Normal rate, regular rhythm and normal heart sounds.   Pulmonary/Chest: Effort normal and breath sounds normal.  Abdominal: Soft. Bowel sounds are normal. There is tenderness (RUq). There is no rebound and no guarding.  Musculoskeletal: Normal range of motion.  Neurological: She is alert and oriented to person, place, and time.  Skin: Skin is warm and dry.    Data Reviewed   Assessment    37 year old female with symptomatic cholelithiasis    Plan    1. We'll proceed to the operating room for laparoscopic cholecystectomy with intraoperative cholangiogram 2.All risks and benefits were discussed with the patient to generally include: infection, bleeding, possible need for post op ERCP, damage to the bile ducts, and bile leak. Alternatives were offered and described.  All questions were answered and the patient voiced understanding of the procedure and wishes to proceed at this point with a laparoscopic cholecystectomy         Marigene Ehlers., [facility] 05/06/2013, 9:14 AM

## 2013-05-18 ENCOUNTER — Encounter (HOSPITAL_COMMUNITY): Payer: Self-pay | Admitting: Pharmacy Technician

## 2013-05-24 ENCOUNTER — Encounter (HOSPITAL_COMMUNITY)
Admission: RE | Admit: 2013-05-24 | Discharge: 2013-05-24 | Disposition: A | Discharge status: Home / Self Care | Payer: 59 | Source: Ambulatory Visit | Attending: General Surgery | Admitting: General Surgery

## 2013-05-24 ENCOUNTER — Encounter (HOSPITAL_COMMUNITY): Payer: Self-pay

## 2013-05-24 HISTORY — DX: Headache: R51

## 2013-05-24 HISTORY — DX: Calculus of gallbladder without cholecystitis without obstruction: K80.20

## 2013-05-24 LAB — BASIC METABOLIC PANEL
BUN: 9 mg/dL (ref 6–23)
CO2: 28 mEq/L (ref 19–32)
Chloride: 102 mEq/L (ref 96–112)
Creatinine, Ser: 0.91 mg/dL (ref 0.50–1.10)
GFR calc Af Amer: >90 mL/min (ref >90)
Glucose, Bld: 101 mg/dL — ABNORMAL HIGH (ref 70–99)
Potassium: 4.3 mEq/L (ref 3.5–5.1)
Sodium: 138 mEq/L (ref 135–145)

## 2013-05-24 LAB — CBC
HCT: 38.5 % (ref 36.0–46.0)
Hemoglobin: 12.5 g/dL (ref 12.0–15.0)
MCV: 84.4 fL (ref 78.0–100.0)
RBC: 4.56 MIL/uL (ref 3.87–5.11)
WBC: 9.1 10*3/uL (ref 4.0–10.5)

## 2013-05-24 MED ORDER — CHLORHEXIDINE GLUCONATE 4 % EX LIQD: 1.0000 "application " | Freq: Once | CUTANEOUS | Status: DC

## 2013-05-24 NOTE — Patient Instructions (Addendum)
YOUR SURGERY IS SCHEDULED AT [facility]  ON:  Tuesday  10/21  REPORT TO [facility] AT:  10:30 AM      PHONE # FOR SHORT STAY IS [phone]  DO NOT EAT OR DRINK ANYTHING AFTER MIDNIGHT THE NIGHT BEFORE YOUR SURGERY.  YOU MAY BRUSH YOUR TEETH, RINSE OUT YOUR MOUTH--BUT NO WATER, NO FOOD, NO CHEWING GUM, NO MINTS, NO CANDIES, NO CHEWING TOBACCO.  PLEASE TAKE THE FOLLOWING MEDICATIONS THE AM OF YOUR SURGERY WITH A FEW SIPS OF WATER:  NO MEDS TO TAKE   DO NOT BRING VALUABLES, MONEY, CREDIT CARDS.  DO NOT WEAR JEWELRY, MAKE-UP, NAIL POLISH AND NO METAL PINS OR CLIPS IN YOUR HAIR. CONTACT LENS, DENTURES / PARTIALS, GLASSES SHOULD NOT BE WORN TO SURGERY AND IN MOST CASES-HEARING AIDS WILL NEED TO BE REMOVED.  BRING YOUR GLASSES CASE, ANY EQUIPMENT NEEDED FOR YOUR CONTACT LENS. FOR PATIENTS ADMITTED TO THE HOSPITAL--CHECK OUT TIME THE DAY OF DISCHARGE IS 11:00 AM.  ALL INPATIENT ROOMS ARE PRIVATE - WITH BATHROOM, TELEPHONE, TELEVISION AND WIFI INTERNET.  IF YOU ARE BEING DISCHARGED THE SAME DAY OF YOUR SURGERY--YOU CAN NOT DRIVE YOURSELF HOME--AND SHOULD NOT GO HOME ALONE BY TAXI OR BUS.  NO DRIVING OR OPERATING MACHINERY FOR 24 HOURS FOLLOWING ANESTHESIA / PAIN MEDICATIONS.  PLEASE MAKE ARRANGEMENTS FOR SOMEONE TO BE WITH YOU AT HOME THE FIRST 24 HOURS AFTER SURGERY. RESPONSIBLE DRIVER'S NAME  JOHN WEBSTER- PT'S FATHER                                               PHONE #   [phone]                                FAILURE TO FOLLOW THESE INSTRUCTIONS MAY RESULT IN THE CANCELLATION OF YOUR SURGERY.   PATIENT SIGNATURE_________________________________

## 2013-05-24 NOTE — Pre-Procedure Instructions (Signed)
EKG AND CXR NOT NEEDED PER ANESTHESIOLOGIST'S GUIDELINES. 

## 2013-05-25 ENCOUNTER — Encounter (HOSPITAL_COMMUNITY): Payer: 59 | Admitting: Registered Nurse

## 2013-05-25 ENCOUNTER — Encounter (HOSPITAL_COMMUNITY): Payer: Self-pay | Admitting: *Deleted

## 2013-05-25 ENCOUNTER — Observation Stay (HOSPITAL_COMMUNITY)
Admission: RE | Admit: 2013-05-25 | Discharge: 2013-05-26 | Disposition: A | Discharge status: Home / Self Care | Payer: 59 | Source: Ambulatory Visit | Attending: General Surgery | Admitting: General Surgery

## 2013-05-25 ENCOUNTER — Encounter (HOSPITAL_COMMUNITY)
Admission: RE | Disposition: A | Discharge status: Home / Self Care | Payer: Self-pay | Source: Ambulatory Visit | Attending: General Surgery

## 2013-05-25 ENCOUNTER — Ambulatory Visit (HOSPITAL_COMMUNITY): Payer: 59 | Admitting: Registered Nurse

## 2013-05-25 DIAGNOSIS — Z9049 Acquired absence of other specified parts of digestive tract: Secondary | ICD-10-CM

## 2013-05-25 DIAGNOSIS — G500 Trigeminal neuralgia: Secondary | ICD-10-CM | POA: Insufficient documentation

## 2013-05-25 DIAGNOSIS — K8020 Calculus of gallbladder without cholecystitis without obstruction: Principal | ICD-10-CM | POA: Insufficient documentation

## 2013-05-25 DIAGNOSIS — K824 Cholesterolosis of gallbladder: Secondary | ICD-10-CM

## 2013-05-25 DIAGNOSIS — K8010 Calculus of gallbladder with chronic cholecystitis without obstruction: Secondary | ICD-10-CM

## 2013-05-25 HISTORY — PX: CHOLECYSTECTOMY: SHX55

## 2013-05-25 SURGERY — LAPAROSCOPIC CHOLECYSTECTOMY WITH INTRAOPERATIVE CHOLANGIOGRAM
Anesthesia: General | Wound class: Clean Contaminated

## 2013-05-25 MED ORDER — CEFAZOLIN SODIUM-DEXTROSE 2-3 GM-% IV SOLR
INTRAVENOUS | Status: AC
  Filled 2013-05-25: qty 50

## 2013-05-25 MED ORDER — DEXTROSE 5 % IV SOLN
3.0000 g | INTRAVENOUS | Status: AC
  Administered 2013-05-25: 3 g via INTRAVENOUS
  Filled 2013-05-25: qty 3000

## 2013-05-25 MED ORDER — LACTATED RINGERS IV SOLN: INTRAVENOUS | Status: DC

## 2013-05-25 MED ORDER — LACTATED RINGERS IV SOLN
INTRAVENOUS | Status: DC
  Administered 2013-05-25 (×2): 1000 mL via INTRAVENOUS

## 2013-05-25 MED ORDER — BUPIVACAINE-EPINEPHRINE PF 0.25-1:200000 % IJ SOLN
INTRAMUSCULAR | Status: DC | PRN
  Administered 2013-05-25: 8 mL

## 2013-05-25 MED ORDER — ONDANSETRON HCL 4 MG/2ML IJ SOLN
INTRAMUSCULAR | Status: DC | PRN
  Administered 2013-05-25: 4 mg via INTRAVENOUS

## 2013-05-25 MED ORDER — NEOSTIGMINE METHYLSULFATE 1 MG/ML IJ SOLN
INTRAMUSCULAR | Status: DC | PRN
  Administered 2013-05-25: 5 mg via INTRAVENOUS

## 2013-05-25 MED ORDER — GLYCOPYRROLATE 0.2 MG/ML IJ SOLN
INTRAMUSCULAR | Status: DC | PRN
  Administered 2013-05-25: .8 mg via INTRAVENOUS

## 2013-05-25 MED ORDER — OXYCODONE-ACETAMINOPHEN 10-325 MG PO TABS: 1.0000 | ORAL_TABLET | ORAL | Status: DC | PRN

## 2013-05-25 MED ORDER — LACTATED RINGERS IR SOLN
Status: DC | PRN
  Administered 2013-05-25: 1

## 2013-05-25 MED ORDER — HYDROCODONE-ACETAMINOPHEN 5-325 MG PO TABS
1.0000 | ORAL_TABLET | ORAL | Status: DC | PRN
  Administered 2013-05-25 – 2013-05-26 (×2): 2 via ORAL
  Filled 2013-05-25 (×2): qty 2

## 2013-05-25 MED ORDER — PROMETHAZINE HCL 25 MG/ML IJ SOLN
6.2500 mg | INTRAMUSCULAR | Status: DC | PRN
  Administered 2013-05-25: 6.25 mg via INTRAVENOUS

## 2013-05-25 MED ORDER — 0.9 % SODIUM CHLORIDE (POUR BTL) OPTIME
TOPICAL | Status: DC | PRN
  Administered 2013-05-25: 1000 mL

## 2013-05-25 MED ORDER — DEXAMETHASONE SODIUM PHOSPHATE 10 MG/ML IJ SOLN
INTRAMUSCULAR | Status: DC | PRN
  Administered 2013-05-25: 10 mg via INTRAVENOUS

## 2013-05-25 MED ORDER — PROPOFOL 10 MG/ML IV BOLUS
INTRAVENOUS | Status: DC | PRN
  Administered 2013-05-25: 250 mg via INTRAVENOUS

## 2013-05-25 MED ORDER — HYDROMORPHONE HCL PF 1 MG/ML IJ SOLN
INTRAMUSCULAR | Status: AC
  Filled 2013-05-25: qty 1

## 2013-05-25 MED ORDER — HYDROMORPHONE HCL PF 1 MG/ML IJ SOLN
1.0000 mg | INTRAMUSCULAR | Status: DC | PRN
  Administered 2013-05-25: 1 mg via INTRAVENOUS
  Filled 2013-05-25: qty 1

## 2013-05-25 MED ORDER — ACETAMINOPHEN 10 MG/ML IV SOLN
1000.0000 mg | Freq: Once | INTRAVENOUS | Status: AC
  Administered 2013-05-25: 1000 mg via INTRAVENOUS
  Filled 2013-05-25: qty 100

## 2013-05-25 MED ORDER — BUPIVACAINE-EPINEPHRINE PF 0.25-1:200000 % IJ SOLN
INTRAMUSCULAR | Status: AC
  Filled 2013-05-25: qty 30

## 2013-05-25 MED ORDER — LIDOCAINE HCL (CARDIAC) 20 MG/ML IV SOLN
INTRAVENOUS | Status: DC | PRN
  Administered 2013-05-25: 100 mg via INTRAVENOUS

## 2013-05-25 MED ORDER — PROMETHAZINE HCL 25 MG/ML IJ SOLN
INTRAMUSCULAR | Status: AC
  Filled 2013-05-25: qty 1

## 2013-05-25 MED ORDER — FENTANYL CITRATE 0.05 MG/ML IJ SOLN
INTRAMUSCULAR | Status: DC | PRN
  Administered 2013-05-25 (×2): 50 ug via INTRAVENOUS
  Administered 2013-05-25 (×2): 25 ug via INTRAVENOUS
  Administered 2013-05-25 (×4): 50 ug via INTRAVENOUS

## 2013-05-25 MED ORDER — CEFAZOLIN SODIUM 1-5 GM-% IV SOLN
INTRAVENOUS | Status: AC
  Filled 2013-05-25: qty 50

## 2013-05-25 MED ORDER — ONDANSETRON HCL 4 MG/2ML IJ SOLN
4.0000 mg | Freq: Four times a day (QID) | INTRAMUSCULAR | Status: DC | PRN
  Administered 2013-05-25: 4 mg via INTRAVENOUS
  Filled 2013-05-25: qty 2

## 2013-05-25 MED ORDER — ROCURONIUM BROMIDE 100 MG/10ML IV SOLN
INTRAVENOUS | Status: DC | PRN
  Administered 2013-05-25: 50 mg via INTRAVENOUS

## 2013-05-25 MED ORDER — HYDROMORPHONE HCL PF 1 MG/ML IJ SOLN
0.2500 mg | INTRAMUSCULAR | Status: DC | PRN
  Administered 2013-05-25 (×2): 0.5 mg via INTRAVENOUS

## 2013-05-25 MED ORDER — MIDAZOLAM HCL 5 MG/5ML IJ SOLN
INTRAMUSCULAR | Status: DC | PRN
  Administered 2013-05-25 (×2): 1 mg via INTRAVENOUS

## 2013-05-25 MED ORDER — LACTATED RINGERS IV SOLN: INTRAVENOUS | Status: DC | PRN

## 2013-05-25 MED ORDER — SODIUM CHLORIDE 0.9 % IV SOLN
INTRAVENOUS | Status: DC
  Administered 2013-05-25: 19:00:00 via INTRAVENOUS

## 2013-05-25 MED ORDER — SUCCINYLCHOLINE CHLORIDE 20 MG/ML IJ SOLN
INTRAMUSCULAR | Status: DC | PRN
  Administered 2013-05-25: 100 mg via INTRAVENOUS

## 2013-05-25 SURGICAL SUPPLY — 51 items
ANCHOR TIS RET SYS 200 (MISCELLANEOUS) ×1 IMPLANT
APL SKNCLS STERI-STRIP NONHPOA (GAUZE/BANDAGES/DRESSINGS) ×1
APPLIER CLIP 5 13 M/L LIGAMAX5 (MISCELLANEOUS)
APR CLP MED LRG 5 ANG JAW (MISCELLANEOUS)
BENZOIN TINCTURE PRP APPL 2/3 (GAUZE/BANDAGES/DRESSINGS) ×2 IMPLANT
CABLE HIGH FREQUENCY MONO STRZ (ELECTRODE) ×2 IMPLANT
CANISTER SUCTION 2500CC (MISCELLANEOUS) ×2 IMPLANT
CHLORAPREP W/TINT 26ML (MISCELLANEOUS) ×3 IMPLANT
CLIP APPLIE 5 13 M/L LIGAMAX5 (MISCELLANEOUS) ×1 IMPLANT
CLIP LIGATING HEMO O LOK GREEN (MISCELLANEOUS) ×4 IMPLANT
CLOTH BEACON ORANGE TIMEOUT ST (SAFETY) ×1 IMPLANT
COVER MAYO STAND STRL (DRAPES) IMPLANT
COVER TRANSDUCER ULTRASND (DRAPES) ×2 IMPLANT
DECANTER SPIKE VIAL GLASS SM (MISCELLANEOUS) ×1 IMPLANT
DEVICE PMI PUNCTURE CLOSURE (MISCELLANEOUS) ×2 IMPLANT
DRAPE C-ARM 42X120 X-RAY (DRAPES) IMPLANT
DRAPE LAPAROSCOPIC ABDOMINAL (DRAPES) ×2 IMPLANT
DRAPE UTILITY XL STRL (DRAPES) ×3 IMPLANT
ELECT REM PT RETURN 9FT ADLT (ELECTROSURGICAL) ×2
ELECTRODE REM PT RTRN 9FT ADLT (ELECTROSURGICAL) ×1 IMPLANT
GAUZE SPONGE 2X2 8PLY STRL LF (GAUZE/BANDAGES/DRESSINGS) IMPLANT
GLOVE BIO SURGEON STRL SZ7.5 (GLOVE) ×2 IMPLANT
GLOVE BIOGEL PI IND STRL 8 (GLOVE) IMPLANT
GLOVE BIOGEL PI INDICATOR 8 (GLOVE) ×1
GLOVE ECLIPSE 8.0 STRL XLNG CF (GLOVE) ×1 IMPLANT
GLOVE SS BIOGEL STRL SZ 7 (GLOVE) IMPLANT
GLOVE SUPERSENSE BIOGEL SZ 7 (GLOVE) ×1
GLOVE SURG SS PI 7.0 STRL IVOR (GLOVE) ×1 IMPLANT
GOWN STRL REIN 3XL LVL4 (GOWN DISPOSABLE) ×1 IMPLANT
GOWN STRL REIN XL XLG (GOWN DISPOSABLE) ×6 IMPLANT
KIT BASIN OR (CUSTOM PROCEDURE TRAY) ×2 IMPLANT
NDL INSUFFLATION 14GA 120MM (NEEDLE) ×1 IMPLANT
NEEDLE INSUFFLATION 14GA 120MM (NEEDLE) ×2 IMPLANT
NS IRRIG 1000ML POUR BTL (IV SOLUTION) ×2 IMPLANT
RINGERS IRRIG 1000ML POUR BTL (IV SOLUTION) ×2 IMPLANT
SCISSORS LAP 5X35 DISP (ENDOMECHANICALS) ×2 IMPLANT
SET CHOLANGIOGRAPH MIX (MISCELLANEOUS) IMPLANT
SET IRRIG TUBING LAPAROSCOPIC (IRRIGATION / IRRIGATOR) ×2 IMPLANT
SOLUTION ANTI FOG 6CC (MISCELLANEOUS) ×2 IMPLANT
SPONGE GAUZE 2X2 STER 10/PKG (GAUZE/BANDAGES/DRESSINGS) ×1
SPONGE GAUZE 4X4 12PLY (GAUZE/BANDAGES/DRESSINGS) ×1 IMPLANT
STRIP CLOSURE SKIN 1/2X4 (GAUZE/BANDAGES/DRESSINGS) ×2 IMPLANT
SUT MNCRL AB 4-0 PS2 18 (SUTURE) ×2 IMPLANT
TOWEL OR 17X26 10 PK STRL BLUE (TOWEL DISPOSABLE) ×2 IMPLANT
TOWEL OR NON WOVEN STRL DISP B (DISPOSABLE) ×1 IMPLANT
TRAY LAP CHOLE (CUSTOM PROCEDURE TRAY) ×2 IMPLANT
TROCAR BLADELESS OPT 5 150 (ENDOMECHANICALS) ×2 IMPLANT
TROCAR BLADELESS OPT 5 75 (ENDOMECHANICALS) ×1 IMPLANT
TROCAR CLOSURE SUTURE GRASPER ×1 IMPLANT
TROCAR XCEL NON-BLD 11X100MML (ENDOMECHANICALS) ×2 IMPLANT
TUBING INSUFFLATION 10FT LAP (TUBING) ×2 IMPLANT

## 2013-05-25 NOTE — Transfer of Care (Signed)
Immediate Anesthesia Transfer of Care Note  Patient: Phyllis Calhoun  Procedure(s) Performed: Procedure(s): LAPAROSCOPIC CHOLECYSTECTOMY WITH INTRAOPERATIVE CHOLANGIOGRAM (N/A)  Patient Location: PACU  Anesthesia Type:General  Level of Consciousness: awake, alert , oriented and patient cooperative  Airway & Oxygen Therapy: Patient Spontanous Breathing and Patient connected to face mask oxygen  Post-op Assessment: Report given to PACU RN, Post -op Vital signs reviewed and stable and Patient moving all extremities  Post vital signs: Reviewed and stable  Complications: No apparent anesthesia complications

## 2013-05-25 NOTE — Anesthesia Preprocedure Evaluation (Addendum)
Anesthesia Evaluation  Patient identified by MRN, date of birth, ID band Patient awake    Reviewed: Allergy & Precautions, H&P , NPO status , Patient's Chart, lab work & pertinent test results  Airway Mallampati: III TM Distance: >3 FB     Dental  (+) Teeth Intact and Dental Advisory Given   Pulmonary neg pulmonary ROS,  breath sounds clear to auscultation  Pulmonary exam normal       Cardiovascular negative cardio ROS  Rhythm:Regular Rate:Normal     Neuro/Psych  Headaches,  Neuromuscular disease negative psych ROS   GI/Hepatic negative GI ROS, Neg liver ROS,   Endo/Other  Morbid obesity  Renal/GU negative Renal ROS  negative genitourinary   Musculoskeletal negative musculoskeletal ROS (+)   Abdominal   Peds  Hematology negative hematology ROS (+)   Anesthesia Other Findings   Reproductive/Obstetrics negative OB ROS                        Anesthesia Physical Anesthesia Plan  ASA: III  Anesthesia Plan: General   Post-op Pain Management:    Induction: Intravenous  Airway Management Planned: Oral ETT  Additional Equipment:   Intra-op Plan:   Post-operative Plan: Extubation in OR  Informed Consent: I have reviewed the patients History and Physical, chart, labs and discussed the procedure including the risks, benefits and alternatives for the proposed anesthesia with the patient or authorized representative who has indicated his/her understanding and acceptance.   Dental advisory given  Plan Discussed with: CRNA  Anesthesia Plan Comments:         Anesthesia Quick Evaluation

## 2013-05-25 NOTE — Op Note (Signed)
Pre Operative Diagnosis: symptomatic cholelithiasis  Post Operative Diagnosis: same   Procedure: Lap chole  Surgeon: Dr. Axel Filler   Assistant: none   Anesthesia: GETA   EBL: <5cc   Complications: none   Counts: reported as correct x 2   Findings:the patient chronically inflamed gallbladder with gallstones.  Indications for procedure: Pt is a 37 year old female with RUQ pain and seen to have gallstones. The patient was seen in clinic counseled and consented to have her gallbladder electively removed.  Details of the procedure: The patient was taken to the operating and placed in the supine position with bilateral SCDs in place. A time out was called and all facts were verified. A pneumoperitoneum was obtained via A Veress needle technique to a pressure of 14mm of mercury. A 5mm trochar was then placed in the right upper quadrant under visualization, and there were no injuries to any abdominal organs. A 11 mm port was then placed in the umbilical region after infiltrating with local anesthesia under direct visualization. A second epigastric port was placed under direct visualization. The gallbladder was identified and retracted, the peritoneum was then sharply dissected from the gallbladder and this dissection was carried down to Calot's triangle. The cystic duct was identified and stripped away circumferentially and seen going into the gallbladder 360, the critical angle was obtained. It was noted to be very dilated and large.2 clips were placed proximally one distally and the cystic duct transected. The cystic artery was identified and 2 clips placed proximally and one distally and transected. We then proceeded to remove the gallbladder off the hepatic fossa with Bovie cautery. A retrieval bag was then placed in the abdomen and gallbladder placed in the bag. The hepatic fossa was then reexamined and hemostasis was achieved with Bovie cautery and was excellent at this portion of the  case. The subhepatic fossa and perihepatic fossa was then irrigated until the effluent was clear. The 11 mm trocar fascia was reapproximated with the Endo Close #1 Vicryl x1. The pneumoperitoneum was evacuated and all trochars removed under direct visulalization. The skin was then closed with 4-0 Monocryl and the skin dressed with Steri-Strips, gauze, and tape. The patient was awaken from general anesthesia and taken to the recovery room in stable condition.

## 2013-05-25 NOTE — Progress Notes (Signed)
Call placed to Dr. Derrell Lolling re: pt being sleepy, SAO2 requiring O2 to be above 92, pt still c/0 pain and nausea, order rec'd to try additional med and if pt still in pain and low SAO2, update Dr. Derrell Lolling again

## 2013-05-25 NOTE — Progress Notes (Signed)
Up date again to Dr. Rica Mast who spoke with Dr. Derrell Lolling re; pt requiring O2 to maintain SAO2 above 92, pt very drowsy, still in pain; pt will stay in hospital observation overnight

## 2013-05-25 NOTE — Progress Notes (Signed)
Dr. Rica Mast in to see pt; wants to observe pt in PACU longer to allow her to awaken more to keep SAO2 up, lungs clear, SAO2 up to 95 when pt deep breathing and coughing

## 2013-05-25 NOTE — Progress Notes (Signed)
Pt informed that we are not responsible for her belongings including her cell phone, she denies having money with her and I have encouraged her to send her valuables with her parents.  She denied this request at this time.  Pt did verbalize understanding.

## 2013-05-25 NOTE — Anesthesia Postprocedure Evaluation (Signed)
Anesthesia Post Note  Patient: Phyllis Calhoun  Procedure(s) Performed: Procedure(s) (LRB): LAPAROSCOPIC CHOLECYSTECTOMY WITH INTRAOPERATIVE CHOLANGIOGRAM (N/A)  Anesthesia type: General  Patient location: PACU  Post pain: Pain level controlled  Post assessment: Post-op Vital signs reviewed  Last Vitals:  Filed Vitals:   05/25/13 1600  BP:   Pulse:   Temp: 36.9 C  Resp:     Post vital signs: Reviewed  Level of consciousness: sedated  Complications: No apparent anesthesia complications

## 2013-05-25 NOTE — Interval H&P Note (Signed)
History and Physical Interval Note:  05/25/2013 10:52 AM  Phyllis Calhoun  has presented today for surgery, with the diagnosis of cholelithiais  The various methods of treatment have been discussed with the patient and family. After consideration of risks, benefits and other options for treatment, the patient has consented to  Procedure(s): LAPAROSCOPIC CHOLECYSTECTOMY WITH INTRAOPERATIVE CHOLANGIOGRAM (N/A) as a surgical intervention .  The patient's history has been reviewed, patient examined, no change in status, stable for surgery.  I have reviewed the patient's chart and labs.  Questions were answered to the patient's satisfaction.     Marigene Ehlers., Jed Limerick

## 2013-05-25 NOTE — Progress Notes (Signed)
O2 back on, pt very drowsy, SAO2 down to 85 without oxygen and stimulation; pt continue to c/o severe pain but cannot stay awake

## 2013-05-25 NOTE — H&P (View-Only) (Signed)
Patient ID: Phyllis Calhoun, female   DOB: 12/30/1975, 37 y.o.   MRN: [id number]  No chief complaint on file.   HPI Phyllis Calhoun is a 37 y.o. female.  The patient is a 37-year-old female who is referred by Dr. Morrow for evaluation of symptomatic cholelithiasis. Patient states she's had right upper quadrant pain for the last 3-4 months. They have been becoming more frequent, and the last episode lasted approximately 4-5 days. The patient said ultrasound which reveals multiple gallstones within the gallbladder. Patient labs in the ER have been normal.  HPI  Past Medical History  Diagnosis Date  . Trigeminal neuralgia     Past Surgical History  Procedure Laterality Date  . Abdominal hysterectomy    . Breast reduction surgery      No family history on file.  Social History History  Substance Use Topics  . Smoking status: Never Smoker   . Smokeless tobacco: Not on file  . Alcohol Use: No    No Known Allergies  Current Outpatient Prescriptions  Medication Sig Dispense Refill  . acetaminophen (TYLENOL) 500 MG tablet Take 1,000 mg by mouth every 6 (six) hours as needed for pain. Pain.      . ibuprofen (ADVIL,MOTRIN) 200 MG tablet Take 400 mg by mouth every 6 (six) hours as needed for pain.      . ibuprofen (ADVIL,MOTRIN) 600 MG tablet Take 1 tablet (600 mg total) by mouth every 6 (six) hours as needed for pain.  30 tablet  0  . methocarbamol (ROBAXIN-750) 750 MG tablet Take 1 tablet (750 mg total) by mouth 4 (four) times daily.  30 tablet  0   No current facility-administered medications for this visit.    Review of Systems Review of Systems  Constitutional: Negative.   HENT: Negative.   Respiratory: Negative.   Cardiovascular: Negative.   Gastrointestinal: Positive for abdominal pain.  Neurological: Negative.     Blood pressure 122/88, pulse 80, resp. rate 16, height 5' 7.5" (1.715 m), weight 377 lb 3.2 oz (171.097 kg).  Physical Exam Physical Exam   Constitutional: She is oriented to person, place, and time. She appears well-developed and well-nourished.  HENT:  Head: Normocephalic and atraumatic.  Eyes: Conjunctivae and EOM are normal. Pupils are equal, round, and reactive to light.  Neck: Normal range of motion. Neck supple.  Cardiovascular: Normal rate, regular rhythm and normal heart sounds.   Pulmonary/Chest: Effort normal and breath sounds normal.  Abdominal: Soft. Bowel sounds are normal. There is tenderness (RUq). There is no rebound and no guarding.  Musculoskeletal: Normal range of motion.  Neurological: She is alert and oriented to person, place, and time.  Skin: Skin is warm and dry.    Data Reviewed   Assessment    37-year-old female with symptomatic cholelithiasis    Plan    1. We'll proceed to the operating room for laparoscopic cholecystectomy with intraoperative cholangiogram 2.All risks and benefits were discussed with the patient to generally include: infection, bleeding, possible need for post op ERCP, damage to the bile ducts, and bile leak. Alternatives were offered and described.  All questions were answered and the patient voiced understanding of the procedure and wishes to proceed at this point with a laparoscopic cholecystectomy         Faithanne Verret Jr., Stoy Fenn 05/06/2013, 9:14 AM    

## 2013-05-25 NOTE — Preoperative (Signed)
Beta Blockers   Reason not to administer Beta Blockers:Not Applicable 

## 2013-05-25 NOTE — Progress Notes (Signed)
O2 off again; pt encouraged to deep breathe and cough

## 2013-05-25 NOTE — Progress Notes (Signed)
O2 removed, pt encourage to cough, deep breathe, and use incentive spirometry

## 2013-05-26 ENCOUNTER — Encounter (HOSPITAL_COMMUNITY): Payer: Self-pay | Admitting: General Surgery

## 2013-05-26 NOTE — Discharge Summary (Signed)
Physician Discharge Summary  Patient ID: Phyllis Calhoun MRN: [id number] DOB/AGE: [birth year] 37 y.o.  Admit date: 05/25/2013 Discharge date: 05/26/2013  Admission Diagnoses: s/p lap chole and low O2 saturation/pain  Discharge Diagnoses: s/p lap chole  Active Problems:   * No active hospital problems. *   Discharged Condition: good  Hospital Course: Pt was admitted post op secondary to abd pain adn low O2 sats.  She was adv from a CL diet to a regular diet.  Pt was weaned off O2 overnight with O2 sats >92%.  Pt was otherwise afebrile and her pain controlled.  She was deemed stable for DC and Dc'd home.   Consults: None  Significant Diagnostic Studies: noen  Treatments: surgery: 05/25/2013  Discharge Exam: Blood pressure 127/61, pulse 58, temperature 98 F (36.7 C), temperature source Oral, resp. rate 18, height 5' 7.5" (1.715 m), weight 380 lb 5.1 oz (172.51 kg), SpO2 96.00%. General appearance: alert and cooperative GI: soft, non-tender; bowel sounds normal; no masses,  no organomegaly, wounds c/d/i   Disposition: 01-Home or Self Care  Discharge Orders   Future Appointments Provider Department Dept Phone   06/08/2013 11:20 AM Axel Filler, MD [facility], [place] [phone]   Future Orders Complete By Expires   Discharge patient  As directed        Medication List         acetaminophen 500 MG tablet  Commonly known as:  TYLENOL  Take 1,000 mg by mouth every 6 (six) hours as needed for pain. Pain.     ibuprofen 200 MG tablet  Commonly known as:  ADVIL,MOTRIN  Take 400 mg by mouth every 6 (six) hours as needed for pain.     oxyCODONE-acetaminophen 10-325 MG per tablet  Commonly known as:  PERCOCET  Take 1 tablet by mouth every 4 (four) hours as needed for pain.           Follow-up Information   Follow up with Lajean Saver, MD. Schedule an appointment as soon as possible for a visit in 2 weeks.   Specialty:  General Surgery   Contact  information:   [address] [place] [address] [phone]       Signed: Marigene Ehlers., Jed Limerick 05/26/2013, 7:14 AM

## 2013-05-26 NOTE — Progress Notes (Signed)
Patient is alert and oriented, vital signs are stable, discharge instructions reviewed with patient and questions and concerns answered, incisions are within normal limits, patient is tolerating her diet without complaints of nausea or vomiting, patient up and ambulating, patient to follow up with her surgeon within 2 weeks Stanford Breed RN 05-26-2013 10:27am

## 2013-06-08 ENCOUNTER — Ambulatory Visit (INDEPENDENT_AMBULATORY_CARE_PROVIDER_SITE_OTHER): Payer: 59 | Admitting: General Surgery

## 2013-06-08 ENCOUNTER — Encounter (INDEPENDENT_AMBULATORY_CARE_PROVIDER_SITE_OTHER): Payer: Self-pay | Admitting: General Surgery

## 2013-06-08 ENCOUNTER — Encounter (INDEPENDENT_AMBULATORY_CARE_PROVIDER_SITE_OTHER): Payer: Self-pay

## 2013-06-08 VITALS — BP 150/68 | HR 88 | Temp 98.8°F | Resp 15 | Ht 67.5 in | Wt 380.8 lb

## 2013-06-08 DIAGNOSIS — Z9889 Other specified postprocedural states: Secondary | ICD-10-CM

## 2013-06-08 NOTE — Progress Notes (Signed)
Patient ID: Phyllis Calhoun, female   DOB: [birth year], 37 y.o.   MRN: [id number] Post op course The patient is a 37 year old female status post laparoscopic cholecystectomy. Patient has been doing well postoperatively aside from some urgency with bowel movements and loose bowel movements. Patient states she tried to stay on a low-fat diet at this time.  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  This was discussed with the patient.  Assessment and Plan 37 year old female status post laparoscopic cholecystectomy 1. I discussed with the patient she should stay on a low-fat diet as much as possible to help with her diarrhea. I also recommended Imodium and increasing her fiber with Metamucil. 2. I discussed with her if this does not help resolve her diarrhea she can cause back for either a clinic visit or for Mar Daring, MD [facility] General [facility] Minimally Invasive Surgery Trauma & Emergency Surgery

## 2013-09-25 ENCOUNTER — Emergency Department (HOSPITAL_COMMUNITY): Payer: 59

## 2013-09-25 ENCOUNTER — Emergency Department (HOSPITAL_COMMUNITY)
Admission: EM | Admit: 2013-09-25 | Discharge: 2013-09-25 | Disposition: A | Discharge status: Home / Self Care | Payer: 59 | Attending: Emergency Medicine | Admitting: Emergency Medicine

## 2013-09-25 ENCOUNTER — Encounter (HOSPITAL_COMMUNITY): Payer: Self-pay | Admitting: Emergency Medicine

## 2013-09-25 DIAGNOSIS — Z8669 Personal history of other diseases of the nervous system and sense organs: Secondary | ICD-10-CM | POA: Insufficient documentation

## 2013-09-25 DIAGNOSIS — G43909 Migraine, unspecified, not intractable, without status migrainosus: Secondary | ICD-10-CM | POA: Insufficient documentation

## 2013-09-25 DIAGNOSIS — R209 Unspecified disturbances of skin sensation: Secondary | ICD-10-CM | POA: Insufficient documentation

## 2013-09-25 DIAGNOSIS — R202 Paresthesia of skin: Secondary | ICD-10-CM

## 2013-09-25 DIAGNOSIS — Z8719 Personal history of other diseases of the digestive system: Secondary | ICD-10-CM | POA: Insufficient documentation

## 2013-09-25 DIAGNOSIS — R0602 Shortness of breath: Secondary | ICD-10-CM | POA: Insufficient documentation

## 2013-09-25 DIAGNOSIS — R0789 Other chest pain: Secondary | ICD-10-CM | POA: Insufficient documentation

## 2013-09-25 LAB — CBC
HEMATOCRIT: 38.3 % (ref 36.0–46.0)
HEMOGLOBIN: 12.2 g/dL (ref 12.0–15.0)
MCH: 27.2 pg (ref 26.0–34.0)
MCHC: 31.9 g/dL (ref 30.0–36.0)
MCV: 85.3 fL (ref 78.0–100.0)
Platelets: 360 10*3/uL (ref 150–400)
RBC: 4.49 MIL/uL (ref 3.87–5.11)
RDW: 15.7 % — ABNORMAL HIGH (ref 11.5–15.5)
WBC: 9.6 10*3/uL (ref 4.0–10.5)

## 2013-09-25 LAB — BASIC METABOLIC PANEL
BUN: 8 mg/dL (ref 6–23)
CO2: 29 meq/L (ref 19–32)
Calcium: 9 mg/dL (ref 8.4–10.5)
Chloride: 102 mEq/L (ref 96–112)
Creatinine, Ser: 0.89 mg/dL (ref 0.50–1.10)
GFR calc Af Amer: >90 mL/min (ref >90)
GFR calc non Af Amer: 81 mL/min — ABNORMAL LOW (ref >90)
GLUCOSE: 98 mg/dL (ref 70–99)
POTASSIUM: 4.1 meq/L (ref 3.7–5.3)
Sodium: 141 mEq/L (ref 137–147)

## 2013-09-25 LAB — I-STAT TROPONIN, ED
TROPONIN I, POC: 0 ng/mL (ref 0.00–0.08)
TROPONIN I, POC: 0 ng/mL (ref 0.00–0.08)

## 2013-09-25 LAB — D-DIMER, QUANTITATIVE: D-Dimer, Quant: 0.4 ug/mL-FEU (ref 0.00–0.48)

## 2013-09-25 LAB — PRO B NATRIURETIC PEPTIDE: Pro B Natriuretic peptide (BNP): 19.6 pg/mL (ref 0–125)

## 2013-09-25 MED ORDER — HYDROCODONE-ACETAMINOPHEN 5-325 MG PO TABS: 2.0000 | ORAL_TABLET | ORAL | Status: DC | PRN

## 2013-09-25 MED ORDER — HYDROMORPHONE HCL PF 1 MG/ML IJ SOLN
1.0000 mg | Freq: Once | INTRAMUSCULAR | Status: AC
  Administered 2013-09-25: 1 mg via INTRAVENOUS
  Filled 2013-09-25: qty 1

## 2013-09-25 MED ORDER — NITROGLYCERIN 0.4 MG SL SUBL
0.4000 mg | SUBLINGUAL_TABLET | SUBLINGUAL | Status: DC | PRN
  Administered 2013-09-25 (×3): 0.4 mg via SUBLINGUAL

## 2013-09-25 MED ORDER — ASPIRIN 300 MG RE SUPP
300.0000 mg | Freq: Once | RECTAL | Status: AC
  Administered 2013-09-25: 300 mg via RECTAL
  Filled 2013-09-25: qty 1

## 2013-09-25 MED ORDER — ASPIRIN 325 MG PO TABS
325.0000 mg | ORAL_TABLET | ORAL | Status: DC
  Filled 2013-09-25: qty 1

## 2013-09-25 MED ORDER — ONDANSETRON HCL 4 MG/2ML IJ SOLN
4.0000 mg | Freq: Once | INTRAMUSCULAR | Status: AC
  Administered 2013-09-25: 4 mg via INTRAVENOUS
  Filled 2013-09-25: qty 2

## 2013-09-25 NOTE — ED Notes (Signed)
PT refused to take ASA supp. . EDP notified of PT's refusal.

## 2013-09-25 NOTE — Discharge Instructions (Signed)
Chest Pain (Nonspecific) °It is often hard to give a specific diagnosis for the cause of chest pain. There is always a chance that your pain could be related to something serious, such as a heart attack or a blood clot in the lungs. You need to follow up with your caregiver for further evaluation. °CAUSES  °· Heartburn. °· Pneumonia or bronchitis. °· Anxiety or stress. °· Inflammation around your heart (pericarditis) or lung (pleuritis or pleurisy). °· A blood clot in the lung. °· A collapsed lung (pneumothorax). It can develop suddenly on its own (spontaneous pneumothorax) or from injury (trauma) to the chest. °· Shingles infection (herpes zoster virus). °The chest wall is composed of bones, muscles, and cartilage. Any of these can be the source of the pain. °· The bones can be bruised by injury. °· The muscles or cartilage can be strained by coughing or overwork. °· The cartilage can be affected by inflammation and become sore (costochondritis). °DIAGNOSIS  °Lab tests or other studies, such as X-rays, electrocardiography, stress testing, or cardiac imaging, may be needed to find the cause of your pain.  °TREATMENT  °· Treatment depends on what may be causing your chest pain. Treatment may include: °· Acid blockers for heartburn. °· Anti-inflammatory medicine. °· Pain medicine for inflammatory conditions. °· Antibiotics if an infection is present. °· You may be advised to change lifestyle habits. This includes stopping smoking and avoiding alcohol, caffeine, and chocolate. °· You may be advised to keep your head raised (elevated) when sleeping. This reduces the chance of acid going backward from your stomach into your esophagus. °· Most of the time, nonspecific chest pain will improve within 2 to 3 days with rest and mild pain medicine. °HOME CARE INSTRUCTIONS  °· If antibiotics were prescribed, take your antibiotics as directed. Finish them even if you start to feel better. °· For the next few days, avoid physical  activities that bring on chest pain. Continue physical activities as directed. °· Do not smoke. °· Avoid drinking alcohol. °· Only take over-the-counter or prescription medicine for pain, discomfort, or fever as directed by your caregiver. °· Follow your caregiver's suggestions for further testing if your chest pain does not go away. °· Keep any follow-up appointments you made. If you do not go to an appointment, you could develop lasting (chronic) problems with pain. If there is any problem keeping an appointment, you must call to reschedule. °SEEK MEDICAL CARE IF:  °· You think you are having problems from the medicine you are taking. Read your medicine instructions carefully. °· Your chest pain does not go away, even after treatment. °· You develop a rash with blisters on your chest. °SEEK IMMEDIATE MEDICAL CARE IF:  °· You have increased chest pain or pain that spreads to your arm, neck, jaw, back, or abdomen. °· You develop shortness of breath, an increasing cough, or you are coughing up blood. °· You have severe back or abdominal pain, feel nauseous, or vomit. °· You develop severe weakness, fainting, or chills. °· You have a fever. °THIS IS AN EMERGENCY. Do not wait to see if the pain will go away. Get medical help at once. Call your local emergency services (911 in [place] Do not drive yourself to the hospital. °MAKE SURE YOU:  °· Understand these instructions. °· Will watch your condition. °· Will get help right away if you are not doing well or get worse. °Document Released: 05/01/2005 Document Revised: 10/14/2011 Document Reviewed: 02/25/2008 °Exit[employer]® Patient Information ©2014 [employer]   LL[employer].  Migraine Headache A migraine headache is an intense, throbbing pain on one or both sides of your head. A migraine can last for 30 minutes to several hours. CAUSES  The exact cause of a migraine headache is not always known. However, a migraine may be caused when nerves in the brain become irritated and release  chemicals that cause inflammation. This causes pain. Certain things may also trigger migraines, such as:  Alcohol.  Smoking.  Stress.  Menstruation.  Aged cheeses.  Foods or drinks that contain nitrates, glutamate, aspartame, or tyramine.  Lack of sleep.  Chocolate.  Caffeine.  Hunger.  Physical exertion.  Fatigue.  Medicines used to treat chest pain (nitroglycerine), birth control pills, estrogen, and some blood pressure medicines. SIGNS AND SYMPTOMS  Pain on one or both sides of your head.  Pulsating or throbbing pain.  Severe pain that prevents daily activities.  Pain that is aggravated by any physical activity.  Nausea, vomiting, or both.  Dizziness.  Pain with exposure to bright lights, loud noises, or activity.  General sensitivity to bright lights, loud noises, or smells. Before you get a migraine, you may get warning signs that a migraine is coming (aura). An aura may include:  Seeing flashing lights.  Seeing bright spots, halos, or zig-zag lines.  Having tunnel vision or blurred vision.  Having feelings of numbness or tingling.  Having trouble talking.  Having muscle weakness. DIAGNOSIS  A migraine headache is often diagnosed based on:  Symptoms.  Physical exam.  A CT scan or MRI of your head. These imaging tests cannot diagnose migraines, but they can help rule out other causes of headaches. TREATMENT Medicines may be given for pain and nausea. Medicines can also be given to help prevent recurrent migraines.  HOME CARE INSTRUCTIONS  Only take over-the-counter or prescription medicines for pain or discomfort as directed by your health care provider. The use of long-term narcotics is not recommended.  Lie down in a dark, quiet room when you have a migraine.  Keep a journal to find out what may trigger your migraine headaches. For example, write down:  What you eat and drink.  How much sleep you get.  Any change to your diet or  medicines.  Limit alcohol consumption.  Quit smoking if you smoke.  Get 7 9 hours of sleep, or as recommended by your health care provider.  Limit stress.  Keep lights dim if bright lights bother you and make your migraines worse. SEEK IMMEDIATE MEDICAL CARE IF:   Your migraine becomes severe.  You have a fever.  You have a stiff neck.  You have vision loss.  You have muscular weakness or loss of muscle control.  You start losing your balance or have trouble walking.  You feel faint or pass out.  You have severe symptoms that are different from your first symptoms. MAKE SURE YOU:   Understand these instructions.  Will watch your condition.  Will get help right away if you are not doing well or get worse. Document Released: 07/22/2005 Document Revised: 05/12/2013 Document Reviewed: 03/29/2013 [facility] Patient Information 2014 [place], [place].  Paresthesia Paresthesia is an abnormal burning or prickling sensation. This sensation is generally felt in the hands, arms, legs, or feet. However, it may occur in any part of the body. It is usually not painful. The feeling may be described as:  Tingling or numbness.  "Pins and needles."  Skin crawling.  Buzzing.  Limbs "falling asleep."  Itching. Most people experience  temporary (transient) paresthesia at some time in their lives. CAUSES  Paresthesia may occur when you breathe too quickly (hyperventilation). It can also occur without any apparent cause. Commonly, paresthesia occurs when pressure is placed on a nerve. The feeling quickly goes away once the pressure is removed. For some people, however, paresthesia is a long-lasting (chronic) condition caused by an underlying disorder. The underlying disorder may be:  A traumatic, direct injury to nerves. Examples include a:  Broken (fractured) neck.  Fractured skull.  A disorder affecting the brain and spinal cord (central nervous system). Examples  include:  Transverse myelitis.  Encephalitis.  Transient ischemic attack.  Multiple sclerosis.  Stroke.  Tumor or blood vessel problems, such as an arteriovenous malformation pressing against the brain or spinal cord.  A condition that damages the peripheral nerves (peripheral neuropathy). Peripheral nerves are not part of the brain and spinal cord. These conditions include:  Diabetes.  Peripheral vascular disease.  Nerve entrapment syndromes, such as carpal tunnel syndrome.  Shingles.  Hypothyroidism.  Vitamin B12 deficiencies.  Alcoholism.  Heavy metal poisoning (lead, arsenic).  Rheumatoid arthritis.  Systemic lupus erythematosus. DIAGNOSIS  Your caregiver will attempt to find the underlying cause of your paresthesia. Your caregiver may:  Take your medical history.  Perform a physical exam.  Order various lab tests.  Order imaging tests. TREATMENT  Treatment for paresthesia depends on the underlying cause. HOME CARE INSTRUCTIONS  Avoid drinking alcohol.  You may consider massage or acupuncture to help relieve your symptoms.  Keep all follow-up appointments as directed by your caregiver. SEEK IMMEDIATE MEDICAL CARE IF:   You feel weak.  You have trouble walking or moving.  You have problems with speech or vision.  You feel confused.  You cannot control your bladder or bowel movements.  You feel numbness after an injury.  You faint.  Your burning or prickling feeling gets worse when walking.  You have pain, cramps, or dizziness.  You develop a rash. MAKE SURE YOU:  Understand these instructions.  Will watch your condition.  Will get help right away if you are not doing well or get worse. Document Released: 07/12/2002 Document Revised: 10/14/2011 Document Reviewed: 04/12/2011 [facility] Patient Information 2014 [place], [place].

## 2013-09-25 NOTE — ED Notes (Signed)
PT reports on Friday around 4:30 her RLE became numb and this cause her to fall. Pt reports waking up today with a tingling feeling in RLE and CP and SHOB .

## 2013-09-25 NOTE — ED Notes (Signed)
Pt states she was doing a presentation yesterday at work and her right leg went numb and reports she fell back and someone caught her.  Pt reports then started feeling numb all over and then started having pain in chest left side.  Pt reports new sob.  Pt states LE still feel tingly/numb.  No recent travel.

## 2013-09-25 NOTE — ED Notes (Signed)
PT failed swallow screen and has been instructed not to eat or drink .

## 2013-09-25 NOTE — ED Provider Notes (Signed)
CSN: [id number]     Arrival date & time 09/25/13  1402 History   First MD Initiated Contact with Patient 09/25/13 1513     Chief Complaint  Patient presents with  . Chest Pain     (Consider location/radiation/quality/duration/timing/severity/associated sxs/prior Treatment) HPI Comments: Patient is 38 year old female who presents to the ED with a PMHx significant for migraine headaches, trigeminal neuralgia and gallstones who presents to the ED with a multitude of complaints.  She reports symptoms started yesterday when she was standing up and doing a presentation.  She states that she suddenly felt like her right leg had gone numb and then she lost her balance and started to fall backward, she states that a co-worker caught her.  She states this episode lasted only a short time and she was able to get up and walk without difficulty.  She states that she awoke this morning and began to notice tingling sensation to right leg and left arm.  She then reports that she started with left anterior non-radiating chest pain.  She reports pain has continued so she decided to come here.  She reports mild shortness of breath, continued tingling to her left arm but no numbness, denies nausea or vomiting.  She reports no previous history of blood clots, CAD, early family history of CAD, personal history of cancer.    The history is provided by the patient. No language interpreter was used.    Past Medical History  Diagnosis Date  . Trigeminal neuralgia     2012-PT CONTINUES TO HAVE PAIN RIGHT SIDE OF FACE  . Gallstones     HX OF SEVERAL GALLBLADDER ATTACKS  . Headache(784.0)     PT STATES MIGRAINES FROM TRIGEMINAL NEURALGIA   Past Surgical History  Procedure Laterality Date  . Abdominal hysterectomy    . Breast reduction surgery    . Cholecystectomy N/A 05/25/2013    Procedure: LAPAROSCOPIC CHOLECYSTECTOMY WITH INTRAOPERATIVE CHOLANGIOGRAM;  Surgeon: Axel FillerArmando Ramirez, MD;  Location: [facility];  Service:  General;  Laterality: N/A;   No family history on file. History  Substance Use Topics  . Smoking status: Never Smoker   . Smokeless tobacco: Never Used  . Alcohol Use: No   OB History   Grav Para Term Preterm Abortions TAB SAB Ect Mult Living                 Review of Systems  All other systems reviewed and are negative.      Allergies  Review of patient's allergies indicates no known allergies.  Home Medications   Current Outpatient Rx  Name  Route  Sig  Dispense  Refill  . acetaminophen (TYLENOL) 500 MG tablet   Oral   Take 1,000 mg by mouth every 6 (six) hours as needed for pain. Pain.         . SUMAtriptan (IMITREX) 100 MG tablet   Oral   Take 100 mg by mouth every 2 (two) hours as needed for migraine or headache. May repeat in 2 hours if headache persists or recurs.         . topiramate (TOPAMAX) 25 MG tablet   Oral   Take 25 mg by mouth daily. Take 1 tablet every evening for two weeks, then 2 tablets for 2 weeks, then 3 tablets for 2 weeks, then 4 tablets for 2 weeks.         Marland Kitchen. ibuprofen (ADVIL,MOTRIN) 200 MG tablet   Oral   Take 400 mg by  mouth every 6 (six) hours as needed for pain.          BP 115/60  Pulse 87  Temp(Src) 98.2 F (36.8 C) (Oral)  Resp 20  Wt 404 lb 7 oz (183.452 kg)  SpO2 96% Physical Exam  Nursing note and vitals reviewed. Constitutional: She is oriented to person, place, and time. She appears well-developed and well-nourished. No distress.  HENT:  Head: Normocephalic and atraumatic.  Right Ear: External ear normal.  Left Ear: External ear normal.  Nose: Nose normal.  Mouth/Throat: Oropharynx is clear and moist. No oropharyngeal exudate.  Eyes: Conjunctivae are normal. Pupils are equal, round, and reactive to light. No scleral icterus.  Neck: Normal range of motion. Neck supple.  Cardiovascular: Normal rate, regular rhythm and normal heart sounds.  Exam reveals no gallop and no friction rub.   No murmur  heard. Pulmonary/Chest: Effort normal and breath sounds normal. No respiratory distress. She has no wheezes. She has no rales. She exhibits no tenderness.  Abdominal: Soft. Bowel sounds are normal. She exhibits no distension. There is no tenderness. There is no rebound and no guarding.  Musculoskeletal: Normal range of motion. She exhibits no edema and no tenderness.  Lymphadenopathy:    She has no cervical adenopathy.  Neurological: She is alert and oriented to person, place, and time. She has normal strength. She is not disoriented. She displays no atrophy. A sensory deficit is present. No cranial nerve deficit. She exhibits normal muscle tone. Coordination and gait normal. GCS eye subscore is 4. GCS verbal subscore is 5. GCS motor subscore is 6. She displays no Babinski's sign on the right side. She displays no Babinski's sign on the left side.  Reflex Scores:      Patellar reflexes are 2+ on the right side and 2+ on the left side.      Achilles reflexes are 2+ on the right side and 2+ on the left side. Reports decrease in sensation in right LE and left UE  Skin: Skin is warm and dry. No rash noted. No erythema. No pallor.  Psychiatric: She has a normal mood and affect. Her behavior is normal. Judgment and thought content normal.    ED Course  Procedures (including critical care time) Labs Review Labs Reviewed  CBC - Abnormal; Notable for the following:    RDW 15.7 (*)    All other components within normal limits  BASIC METABOLIC PANEL - Abnormal; Notable for the following:    GFR calc non Af Amer 81 (*)    All other components within normal limits  PRO B NATRIURETIC PEPTIDE  D-DIMER, QUANTITATIVE  I-STAT TROPOININ, ED   Imaging Review Dg Chest 2 View  09/25/2013   CLINICAL DATA:  Left upper chest pain increased with breathing  EXAM: CHEST  2 VIEW  COMPARISON:  10/11/2012  FINDINGS: Upper-normal size of cardiac silhouette.  Mediastinal contours and pulmonary vascularity normal.   Peribronchial thickening without infiltrate, pleural effusion or pneumothorax.  No acute osseous findings.  IMPRESSION: Minimal bronchitic changes without infiltrate.   Electronically Signed   By: Ulyses Southward M.D.   On: 09/25/2013 15:43   Ct Head Wo Contrast  09/25/2013   CLINICAL DATA:  Frontal headache. Dizziness. Right upper and lower extremity numbness.  EXAM: CT HEAD WITHOUT CONTRAST  TECHNIQUE: Contiguous axial images were obtained from the base of the skull through the vertex without intravenous contrast.  COMPARISON:  None.  FINDINGS: No evidence of intracranial hemorrhage, brain edema, or other  signs of acute infarction. No evidence of intracranial mass lesion or mass effect. No abnormal extraaxial fluid collections identified. Ventricles are normal in size. No skull abnormality identified.  IMPRESSION: Negative noncontrast head CT.   Electronically Signed   By: Myles Rosenthal M.D.   On: 09/25/2013 16:23    EKG Interpretation   None      Medications  nitroGLYCERIN (NITROSTAT) SL tablet 0.4 mg (0.4 mg Sublingual Given 09/25/13 1751)  aspirin suppository 300 mg (300 mg Rectal Given 09/25/13 1601)  HYDROmorphone (DILAUDID) injection 1 mg (1 mg Intravenous Given 09/25/13 1901)  ondansetron (ZOFRAN) injection 4 mg (4 mg Intravenous Given 09/25/13 1901)     MDM   Atypical Chest pain. Paresthesias Migraine headache  Patient here with a multitude of vague complaints including numbness, paresthesias of extremities without focal weakness, headache with history of migraines, and atypical chest pain.  Serial troponin's negative and EKG wtihout acute findings. Patient already has follow up with PCP on Monday or Tuesday to discuss the results of her MRI.   Izola Price Marisue Humble, Cordelia Poche 09/25/13 2007

## 2013-09-26 NOTE — ED Provider Notes (Signed)
Medical screening examination/treatment/procedure(s) were performed by non-physician practitioner and as supervising physician I was immediately available for consultation/collaboration.   Sixto Bowdish M Henessy Rohrer, MD 09/26/13 0257 

## 2014-04-01 ENCOUNTER — Other Ambulatory Visit (HOSPITAL_COMMUNITY)
Admission: RE | Admit: 2014-04-01 | Discharge: 2014-04-01 | Disposition: A | Discharge status: Home / Self Care | Payer: 59 | Source: Ambulatory Visit | Attending: Family Medicine | Admitting: Family Medicine

## 2014-04-01 ENCOUNTER — Other Ambulatory Visit: Payer: Self-pay | Admitting: Family Medicine

## 2014-04-01 DIAGNOSIS — Z01419 Encounter for gynecological examination (general) (routine) without abnormal findings: Secondary | ICD-10-CM | POA: Insufficient documentation

## 2014-04-05 LAB — CYTOLOGY - PAP

## 2014-08-04 ENCOUNTER — Emergency Department (HOSPITAL_COMMUNITY): Payer: 59

## 2014-08-04 ENCOUNTER — Emergency Department (HOSPITAL_COMMUNITY)
Admission: EM | Admit: 2014-08-04 | Discharge: 2014-08-04 | Disposition: A | Discharge status: Home / Self Care | Payer: 59 | Attending: Emergency Medicine | Admitting: Emergency Medicine

## 2014-08-04 ENCOUNTER — Encounter (HOSPITAL_COMMUNITY): Payer: Self-pay | Admitting: Emergency Medicine

## 2014-08-04 DIAGNOSIS — G43909 Migraine, unspecified, not intractable, without status migrainosus: Secondary | ICD-10-CM | POA: Insufficient documentation

## 2014-08-04 DIAGNOSIS — R079 Chest pain, unspecified: Secondary | ICD-10-CM | POA: Diagnosis present

## 2014-08-04 DIAGNOSIS — R110 Nausea: Secondary | ICD-10-CM | POA: Insufficient documentation

## 2014-08-04 DIAGNOSIS — G500 Trigeminal neuralgia: Secondary | ICD-10-CM | POA: Diagnosis not present

## 2014-08-04 DIAGNOSIS — Z8719 Personal history of other diseases of the digestive system: Secondary | ICD-10-CM | POA: Insufficient documentation

## 2014-08-04 DIAGNOSIS — Z79899 Other long term (current) drug therapy: Secondary | ICD-10-CM | POA: Insufficient documentation

## 2014-08-04 DIAGNOSIS — R0789 Other chest pain: Secondary | ICD-10-CM | POA: Diagnosis not present

## 2014-08-04 DIAGNOSIS — R0602 Shortness of breath: Secondary | ICD-10-CM | POA: Diagnosis not present

## 2014-08-04 LAB — CBC
HCT: 39.9 % (ref 36.0–46.0)
Hemoglobin: 12.7 g/dL (ref 12.0–15.0)
MCH: 26.8 pg (ref 26.0–34.0)
MCHC: 31.8 g/dL (ref 30.0–36.0)
MCV: 84.2 fL (ref 78.0–100.0)
PLATELETS: 325 10*3/uL (ref 150–400)
RBC: 4.74 MIL/uL (ref 3.87–5.11)
RDW: 15.4 % (ref 11.5–15.5)
WBC: 9.3 10*3/uL (ref 4.0–10.5)

## 2014-08-04 LAB — D-DIMER, QUANTITATIVE (NOT AT ARMC): D DIMER QUANT: 0.49 ug{FEU}/mL — AB (ref 0.00–0.48)

## 2014-08-04 LAB — BASIC METABOLIC PANEL
Anion gap: 6 (ref 5–15)
BUN: 8 mg/dL (ref 6–23)
CHLORIDE: 103 meq/L (ref 96–112)
CO2: 30 mmol/L (ref 19–32)
Calcium: 8.9 mg/dL (ref 8.4–10.5)
Creatinine, Ser: 0.85 mg/dL (ref 0.50–1.10)
GFR calc non Af Amer: 85 mL/min — ABNORMAL LOW (ref >90)
Glucose, Bld: 110 mg/dL — ABNORMAL HIGH (ref 70–99)
POTASSIUM: 3.8 mmol/L (ref 3.5–5.1)
SODIUM: 139 mmol/L (ref 135–145)

## 2014-08-04 LAB — I-STAT TROPONIN, ED: TROPONIN I, POC: 0 ng/mL (ref 0.00–0.08)

## 2014-08-04 MED ORDER — IOHEXOL 350 MG/ML SOLN
100.0000 mL | Freq: Once | INTRAVENOUS | Status: AC | PRN
  Administered 2014-08-04: 100 mL via INTRAVENOUS

## 2014-08-04 MED ORDER — HYDROCODONE-ACETAMINOPHEN 5-325 MG PO TABS: 1.0000 | ORAL_TABLET | ORAL | Status: DC | PRN

## 2014-08-04 MED ORDER — HYDROCODONE-ACETAMINOPHEN 5-325 MG PO TABS
1.0000 | ORAL_TABLET | Freq: Once | ORAL | Status: AC
  Administered 2014-08-04: 1 via ORAL
  Filled 2014-08-04: qty 1

## 2014-08-04 NOTE — ED Notes (Signed)
Bed: WA09 Expected date:  Expected time:  Means of arrival:  Comments: TR 6  

## 2014-08-04 NOTE — ED Notes (Signed)
Pt alert, arrives from home, c/o left sided chest pain, onset was several days ago, + SOB, admits to nausea, resp even unlabored, skin pwd

## 2014-08-04 NOTE — ED Notes (Signed)
Patient complains of being cold and dizzy.  Blood pressure low.  Placed in Trendelenburg position.  IV team called to assist with IV access.

## 2014-08-04 NOTE — ED Provider Notes (Signed)
CSN: [id number]     Arrival date & time 08/04/14  1728 History   First MD Initiated Contact with Patient 08/04/14 1820     Chief Complaint  Patient presents with  . Chest Pain     (Consider location/radiation/quality/duration/timing/severity/associated sxs/prior Treatment) HPI   Phyllis Calhoun is a 38 y.o. female who presents for evaluation of left-sided chest pain present for several days.  He also has shortness of breath, and nausea without vomiting.  She denies trauma.  She works in a call center.  She came here by private vehicle, not driving.  She's never had this previously.  There are no other known modifying factors.   Past Medical History  Diagnosis Date  . Trigeminal neuralgia     2012-PT CONTINUES TO HAVE PAIN RIGHT SIDE OF FACE  . Gallstones     HX OF SEVERAL GALLBLADDER ATTACKS  . Headache(784.0)     PT STATES MIGRAINES FROM TRIGEMINAL NEURALGIA   Past Surgical History  Procedure Laterality Date  . Abdominal hysterectomy    . Breast reduction surgery    . Cholecystectomy N/A 05/25/2013    Procedure: LAPAROSCOPIC CHOLECYSTECTOMY WITH INTRAOPERATIVE CHOLANGIOGRAM;  Surgeon: Axel FillerArmando Ramirez, MD;  Location: [facility];  Service: General;  Laterality: N/A;   No family history on file. History  Substance Use Topics  . Smoking status: Never Smoker   . Smokeless tobacco: Never Used  . Alcohol Use: No   OB History    No data available     Review of Systems  All other systems reviewed and are negative.     Allergies  Review of patient's allergies indicates no known allergies.  Home Medications   Prior to Admission medications   Medication Sig Start Date End Date Taking? Authorizing Provider  acetaminophen (TYLENOL) 500 MG tablet Take 1,000 mg by mouth every 6 (six) hours as needed for pain. Pain.   Yes Historical Provider, MD  ibuprofen (ADVIL,MOTRIN) 200 MG tablet Take 400 mg by mouth every 6 (six) hours as needed for pain.   Yes Historical Provider, MD   SUMAtriptan (IMITREX) 100 MG tablet Take 100 mg by mouth every 2 (two) hours as needed for migraine or headache. May repeat in 2 hours if headache persists or recurs.   Yes Historical Provider, MD  HYDROcodone-acetaminophen (NORCO) 5-325 MG per tablet Take 1 tablet by mouth every 4 (four) hours as needed. 08/04/14   Flint MelterElliott L Hawk Mones, MD  topiramate (TOPAMAX) 25 MG tablet Take 25 mg by mouth daily. Take 1 tablet every evening for two weeks, then 2 tablets for 2 weeks, then 3 tablets for 2 weeks, then 4 tablets for 2 weeks.    Historical Provider, MD   BP 99/64 mmHg  Pulse 74  Temp(Src) 97.6 F (36.4 C) (Oral)  Resp 18  Wt 372 lb (168.738 kg)  SpO2 99% Physical Exam  Constitutional: She is oriented to person, place, and time. She appears well-developed.  Morbidly obese  HENT:  Head: Normocephalic and atraumatic.  Right Ear: External ear normal.  Left Ear: External ear normal.  Eyes: Conjunctivae and EOM are normal. Pupils are equal, round, and reactive to light.  Neck: Normal range of motion and phonation normal. Neck supple.  Cardiovascular: Normal rate, regular rhythm and normal heart sounds.   Pulmonary/Chest: Effort normal and breath sounds normal. She exhibits tenderness (bilateral upper mild). She exhibits no bony tenderness.  Abdominal: Soft. There is no tenderness.  Musculoskeletal: Normal range of motion.  Neurological: She is alert  and oriented to person, place, and time. No cranial nerve deficit or sensory deficit. She exhibits normal muscle tone. Coordination normal.  Skin: Skin is warm, dry and intact.  Psychiatric: She has a normal mood and affect. Her behavior is normal. Judgment and thought content normal.  Nursing note and vitals reviewed.   ED Course  Procedures (including critical care time)  Medications  HYDROcodone-acetaminophen (NORCO/VICODIN) 5-325 MG per tablet 1 tablet (1 tablet Oral Given 08/04/14 1842)  iohexol (OMNIPAQUE) 350 MG/ML injection 100 mL (100  mLs Intravenous Contrast Given 08/04/14 2110)    Patient Vitals for the past 24 hrs:  BP Temp Temp src Pulse Resp SpO2 Weight  08/04/14 2326 99/64 mmHg - - 74 18 99 % -  08/04/14 2249 (!) 112/49 mmHg - - 74 14 100 % -  08/04/14 2100 131/73 mmHg - - 80 15 99 % -  08/04/14 2030 (!) 120/51 mmHg - - 77 19 100 % -  08/04/14 2000 132/66 mmHg - - 78 19 100 % -  08/04/14 1950 - - - 77 16 100 % -  08/04/14 1930 (!) 87/41 mmHg - - - 15 - -  08/04/14 1926 (!) 85/28 mmHg - - 79 18 100 % -  08/04/14 1735 144/88 mmHg 97.6 F (36.4 C) Oral 75 16 98 % (!) 372 lb (168.738 kg)    At D/C Reevaluation with update and discussion. After initial assessment and treatment, an updated evaluation reveals She is fairly comfortable, fingings discussed with patient and family member.[name]. [name]      Labs Review Labs Reviewed  BASIC METABOLIC PANEL - Abnormal; Notable for the following:    Glucose, Bld 110 (*)    GFR calc non Af Amer 85 (*)    All other components within normal limits  D-DIMER, QUANTITATIVE - Abnormal; Notable for the following:    D-Dimer, Quant 0.49 (*)    All other components within normal limits  CBC  I-STAT TROPOININ, ED    Imaging Review Dg Chest 2 View  08/04/2014   CLINICAL DATA:  Chest pain.  EXAM: CHEST  2 VIEW  COMPARISON:  September 25, 2013.  FINDINGS: The heart size and mediastinal contours are within normal limits. Both lungs are clear. No pneumothorax or pleural effusion is noted. The visualized skeletal structures are unremarkable.  IMPRESSION: No acute cardiopulmonary abnormality seen.   Electronically Signed   By: Roque LiasJames  Green M.D.   On: 08/04/2014 19:15   Ct Angio Chest Pe W/cm &/or Wo Cm  08/04/2014   CLINICAL DATA:  Left-sided chest pain, initial evaluation.  EXAM: CT ANGIOGRAPHY CHEST WITH CONTRAST  TECHNIQUE: Multidetector CT imaging of the chest was performed using the standard protocol during bolus administration of intravenous contrast. Multiplanar CT image  reconstructions and MIPs were obtained to evaluate the vascular anatomy.  CONTRAST:  100mL OMNIPAQUE IOHEXOL 350 MG/ML SOLN  COMPARISON:  Prior radiograph from earlier the same day.  FINDINGS: Thyroid gland within normal limits. No pathologically enlarged mediastinal, hilar, or axillary lymph nodes are identified.  Intrathoracic aorta of normal caliber. Great vessels within normal limits.  Heart size is normal.  No pericardial effusion.  Evaluation of the pulmonary arterial tree is somewhat limited due to timing of the contrast bolus, body habitus, and respiratory motion artifact. No central or proximal filling defect identified to suggest acute pulmonary embolism. Re-formatted imaging confirms these findings. Evaluation of distal segmental pulmonary arteries somewhat limited due to respiratory motion artifact.  Lungs are clear without  focal infiltrate or pulmonary edema. No pleural effusion. No pneumothorax. No worrisome pulmonary nodule or mass.  Visualized portions of the upper abdomen are unremarkable.  No acute osseous abnormality. No worrisome lytic or blastic osseous lesions.  IMPRESSION: 1. No acute pulmonary embolism identified. 2. No other acute cardiopulmonary abnormality identified.   Electronically Signed   By: Rise Mu M.D.   On: 08/04/2014 21:40     EKG Interpretation   Date/Time:  Thursday August 04 2014 17:37:34 EST Ventricular Rate:  83 PR Interval:  147 QRS Duration: 80 QT Interval:  378 QTC Calculation: 444 R Axis:   49 Text Interpretation:  Sinus rhythm since last tracing no significant  change Confirmed by Felisha Claytor  MD, Paula Zietz ([id number]) on 08/04/2014 6:20:48 PM      MDM   Final diagnoses:  Chest pain  Nonspecific chest pain    Nonspecific chest pain, doubt cardiac, pulmonary or GI source.  CT ordered because d-dimer elevated.  Patient could not be PERCed due to moderate risk.  Nursing Notes Reviewed/ Care Coordinated Applicable Imaging  Reviewed Interpretation of Laboratory Data incorporated into ED treatment  The patient appears reasonably screened and/or stabilized for discharge and I doubt any other medical condition or other [facility] requiring further screening, evaluation, or treatment in the ED at this time prior to discharge.  Plan: Home Medications- Norco; Home Treatments- rest; return here if the recommended treatment, does not improve the symptoms; Recommended follow up- PCP prn  Flint Melter, MD 08/05/14 [phone]

## 2014-08-04 NOTE — Discharge Instructions (Signed)
Use heat on the sore area 3 times a day. See your Dr. for problems, and in one week if not improved.    Chest Pain (Nonspecific) It is often hard to give a specific diagnosis for the cause of chest pain. There is always a chance that your pain could be related to something serious, such as a heart attack or a blood clot in the lungs. You need to follow up with your health care provider for further evaluation. CAUSES   Heartburn.  Pneumonia or bronchitis.  Anxiety or stress.  Inflammation around your heart (pericarditis) or lung (pleuritis or pleurisy).  A blood clot in the lung.  A collapsed lung (pneumothorax). It can develop suddenly on its own (spontaneous pneumothorax) or from trauma to the chest.  Shingles infection (herpes zoster virus). The chest wall is composed of bones, muscles, and cartilage. Any of these can be the source of the pain.  The bones can be bruised by injury.  The muscles or cartilage can be strained by coughing or overwork.  The cartilage can be affected by inflammation and become sore (costochondritis). DIAGNOSIS  Lab tests or other studies may be needed to find the cause of your pain. Your health care provider may have you take a test called an ambulatory electrocardiogram (ECG). An ECG records your heartbeat patterns over a 24-hour period. You may also have other tests, such as:  Transthoracic echocardiogram (TTE). During echocardiography, sound waves are used to evaluate how blood flows through your heart.  Transesophageal echocardiogram (TEE).  Cardiac monitoring. This allows your health care provider to monitor your heart rate and rhythm in real time.  Holter monitor. This is a portable device that records your heartbeat and can help diagnose heart arrhythmias. It allows your health care provider to track your heart activity for several days, if needed.  Stress tests by exercise or by giving medicine that makes the heart beat faster. TREATMENT     Treatment depends on what may be causing your chest pain. Treatment may include:  Acid blockers for heartburn.  Anti-inflammatory medicine.  Pain medicine for inflammatory conditions.  Antibiotics if an infection is present.  You may be advised to change lifestyle habits. This includes stopping smoking and avoiding alcohol, caffeine, and chocolate.  You may be advised to keep your head raised (elevated) when sleeping. This reduces the chance of acid going backward from your stomach into your esophagus. Most of the time, nonspecific chest pain will improve within 2-3 days with rest and mild pain medicine.  HOME CARE INSTRUCTIONS   If antibiotics were prescribed, take them as directed. Finish them even if you start to feel better.  For the next few days, avoid physical activities that bring on chest pain. Continue physical activities as directed.  Do not use any tobacco products, including cigarettes, chewing tobacco, or electronic cigarettes.  Avoid drinking alcohol.  Only take medicine as directed by your health care provider.  Follow your health care provider's suggestions for further testing if your chest pain does not go away.  Keep any follow-up appointments you made. If you do not go to an appointment, you could develop lasting (chronic) problems with pain. If there is any problem keeping an appointment, call to reschedule. SEEK MEDICAL CARE IF:   Your chest pain does not go away, even after treatment.  You have a rash with blisters on your chest.  You have a fever. SEEK IMMEDIATE MEDICAL CARE IF:   You have increased chest pain or  pain that spreads to your arm, neck, jaw, back, or abdomen.  You have shortness of breath.  You have an increasing cough, or you cough up blood.  You have severe back or abdominal pain.  You feel nauseous or vomit.  You have severe weakness.  You faint.  You have chills. This is an emergency. Do not wait to see if the pain will  go away. Get medical help at once. Call your local emergency services (911 in [place] Do not drive yourself to the hospital. MAKE SURE YOU:   Understand these instructions.  Will watch your condition.  Will get help right away if you are not doing well or get worse. Document Released: 05/01/2005 Document Revised: 07/27/2013 Document Reviewed: 02/25/2008 [facility] Patient Information 2015 [employer] [place]. This information is not intended to replace advice given to you by your health care provider. Make sure you discuss any questions you have with your health care provider.

## 2014-08-04 NOTE — ED Notes (Signed)
Patient reports feeling like "someone is pressing down on my chest."  She reports shortness of breath.  Denies dizziness.

## 2014-10-02 IMAGING — CR DG CHEST 2V
2 series · 2 of 2 positions shown · non-contrast
Comparison: 08/27/2012

CLINICAL DATA: Right-sided chest pain.  Nonsmoker.

CHEST - 2 VIEW

[w chest pa]
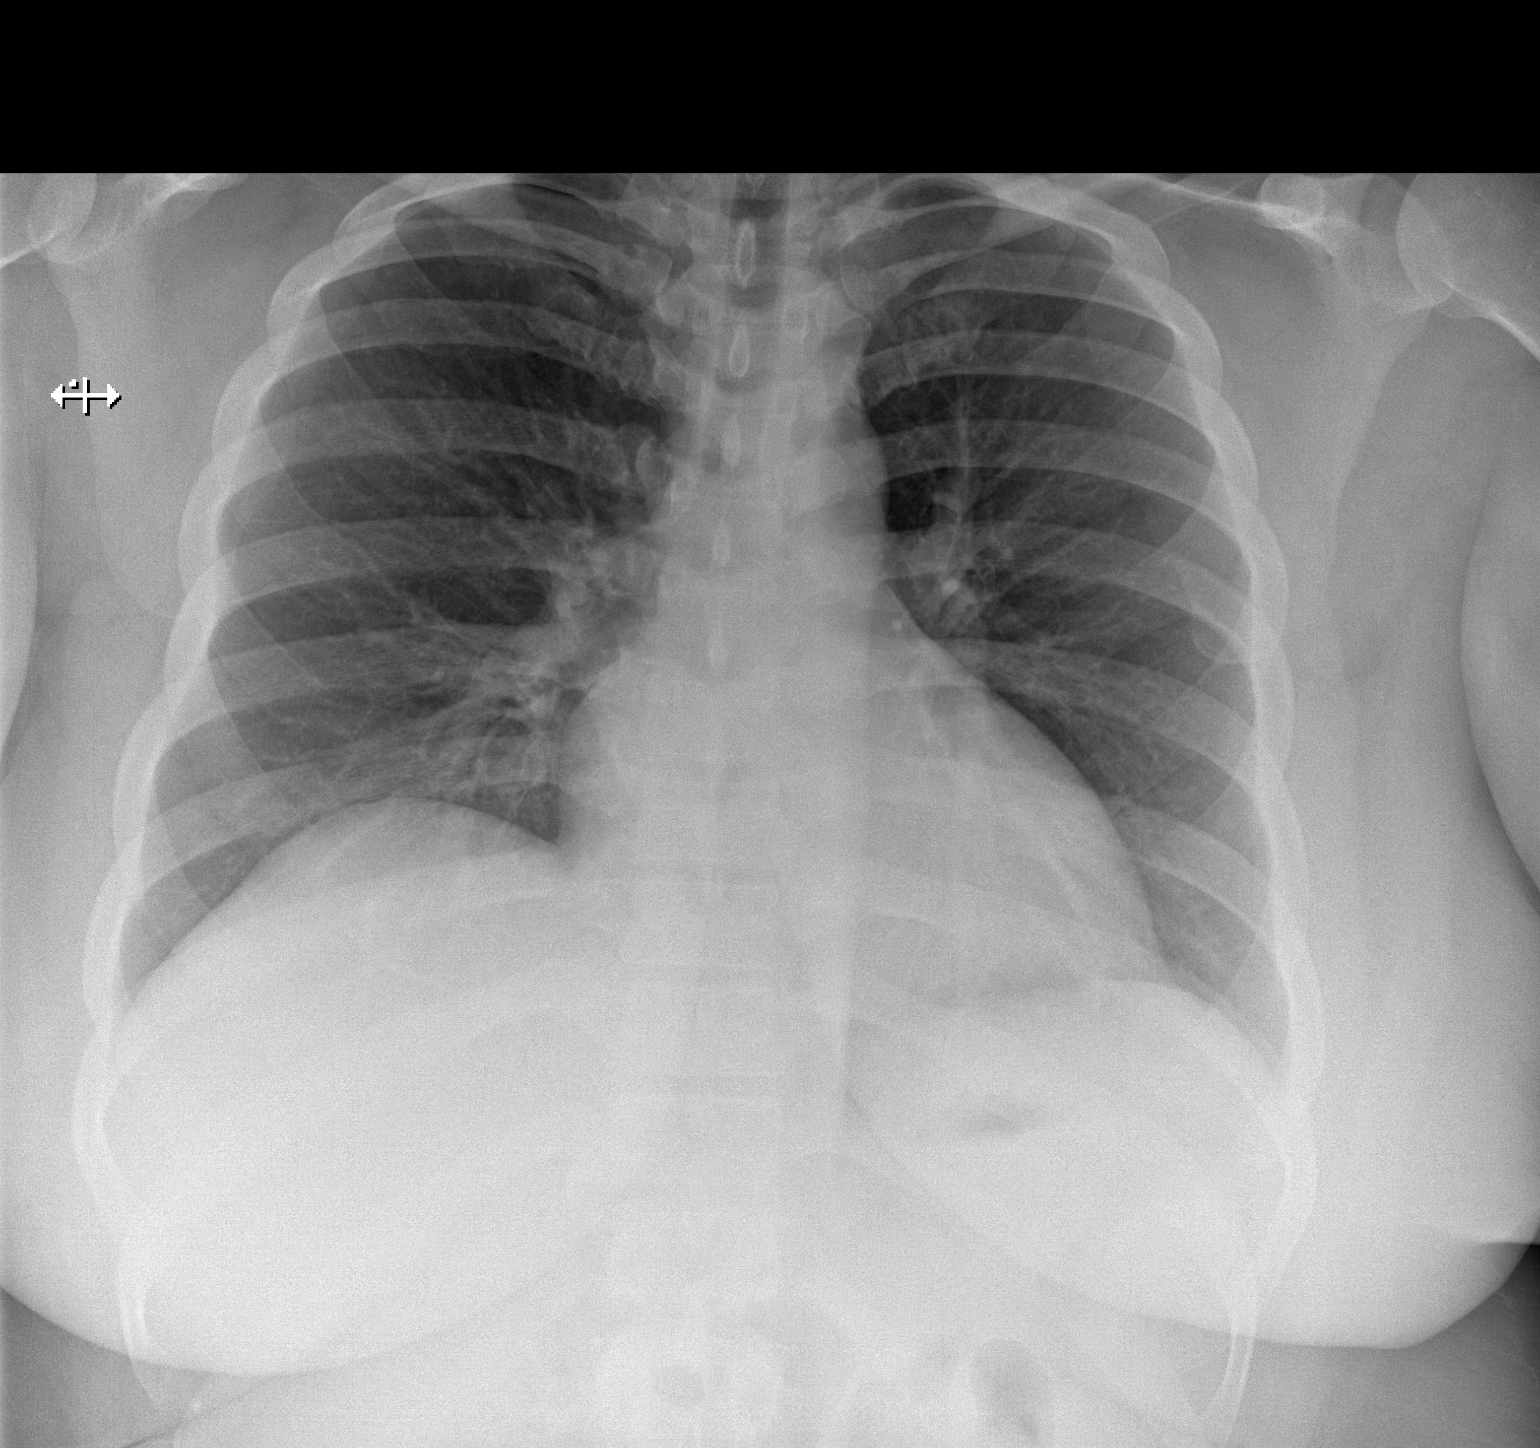

[w chest lat]
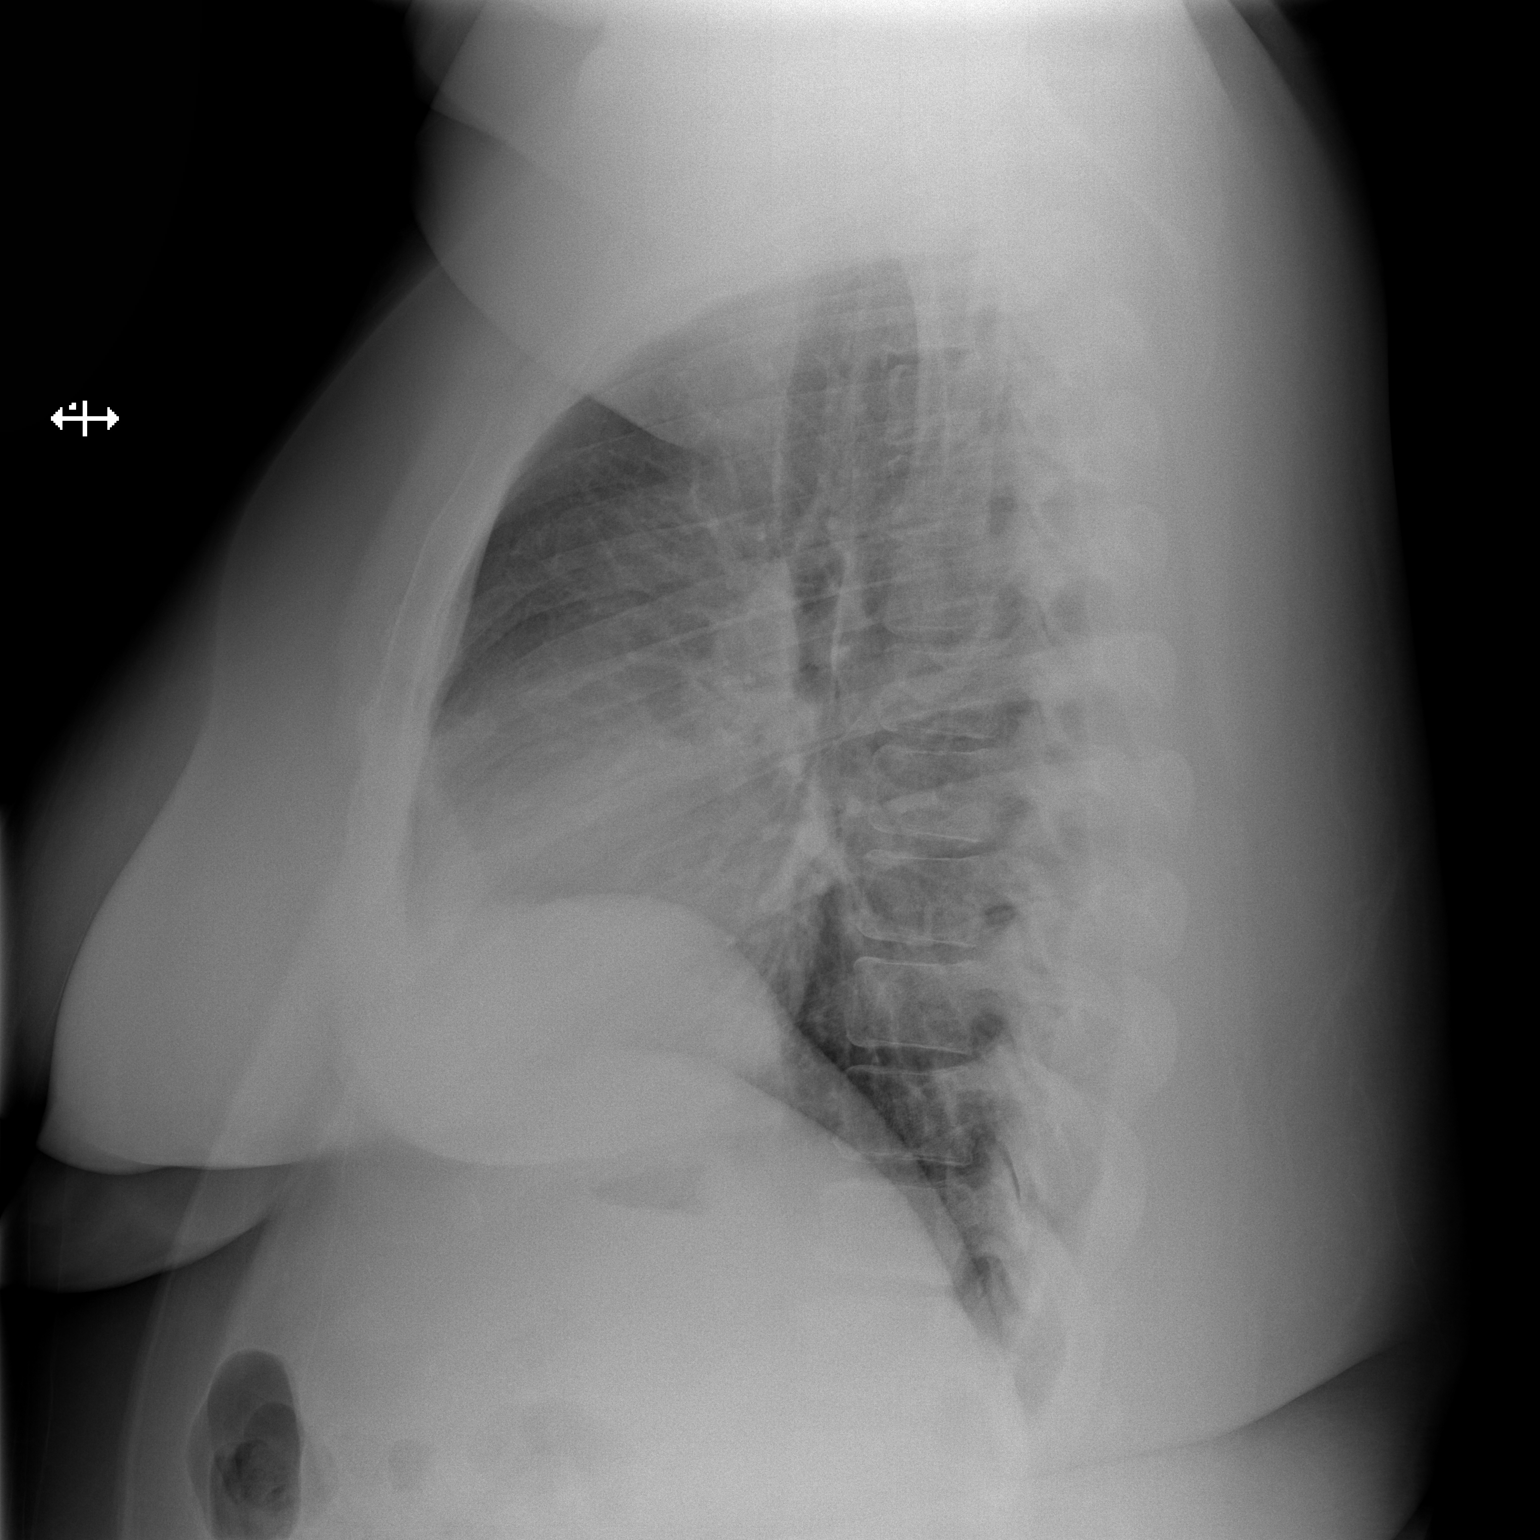

[2 of 2 positions shown; findings below may reference images not displayed]

FINDINGS: Midline trachea.  Normal heart size and mediastinal
contours. No pleural effusion or pneumothorax.  Clear lungs.
IMPRESSION: Normal chest.

## 2015-04-11 ENCOUNTER — Other Ambulatory Visit: Payer: Self-pay | Admitting: Orthopedic Surgery

## 2015-04-11 DIAGNOSIS — M25561 Pain in right knee: Secondary | ICD-10-CM

## 2015-04-17 ENCOUNTER — Ambulatory Visit
Admission: RE | Admit: 2015-04-17 | Discharge: 2015-04-17 | Disposition: A | Discharge status: Home / Self Care | Payer: No Typology Code available for payment source | Source: Ambulatory Visit | Attending: Orthopedic Surgery | Admitting: Orthopedic Surgery

## 2015-04-17 DIAGNOSIS — M25561 Pain in right knee: Secondary | ICD-10-CM

## 2015-04-24 IMAGING — US US ABDOMEN COMPLETE
1 series · 13 of 25 positions shown · non-contrast
Comparison: None.

CLINICAL DATA: Abdominal pain

EXAM:
ULTRASOUND ABDOMEN COMPLETE

[Series 1: us abdomen complete · 0.28mm/px · 13 of 64 slices shown]
[im 1/64]
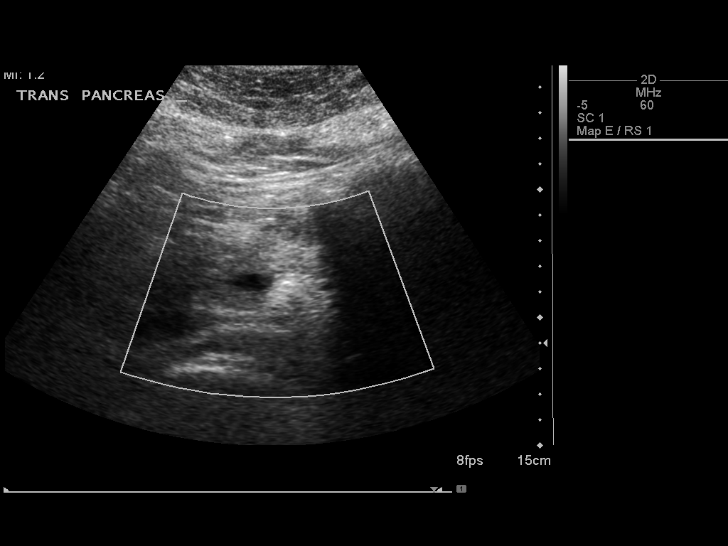
[im 6/64]
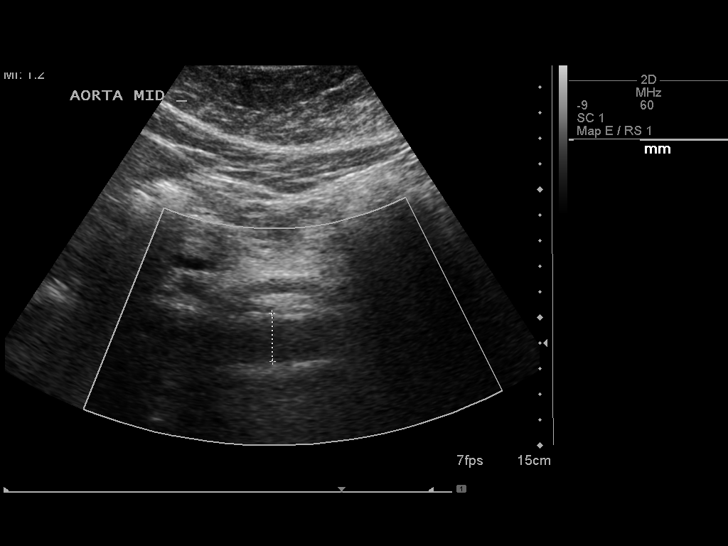
[im 11/64]
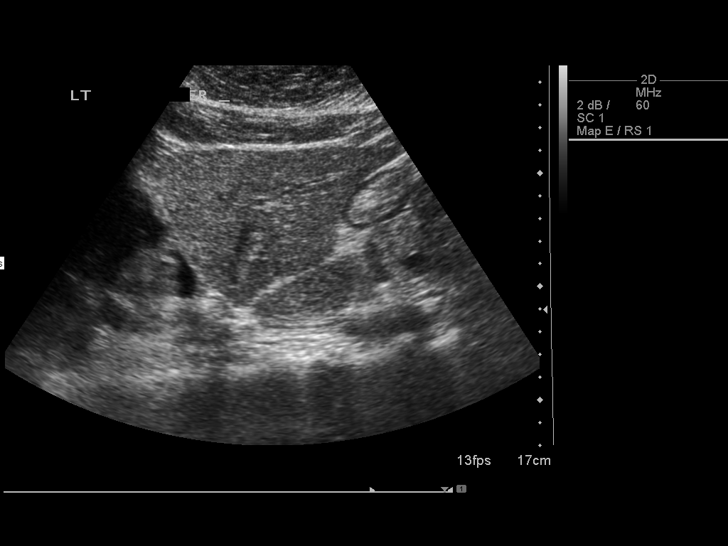
[im 16/64]
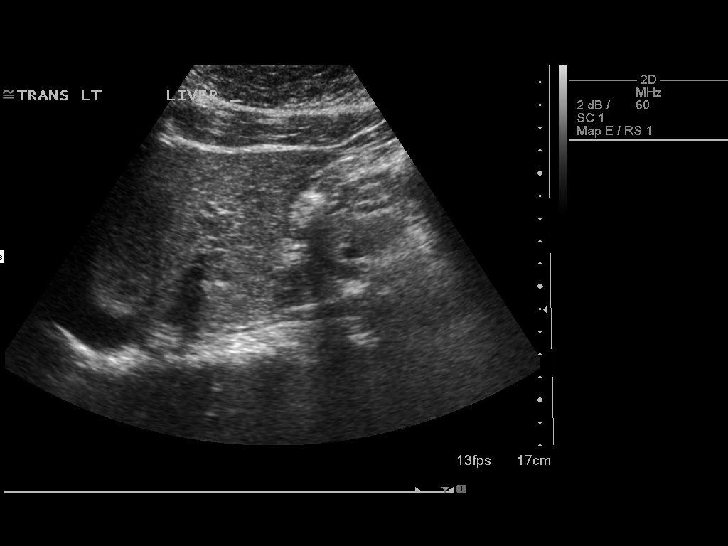
[im 22/64]
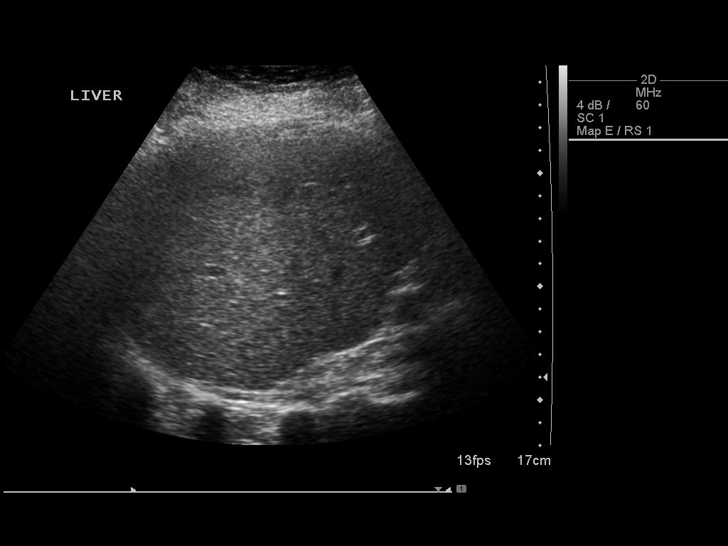
[im 27/64]
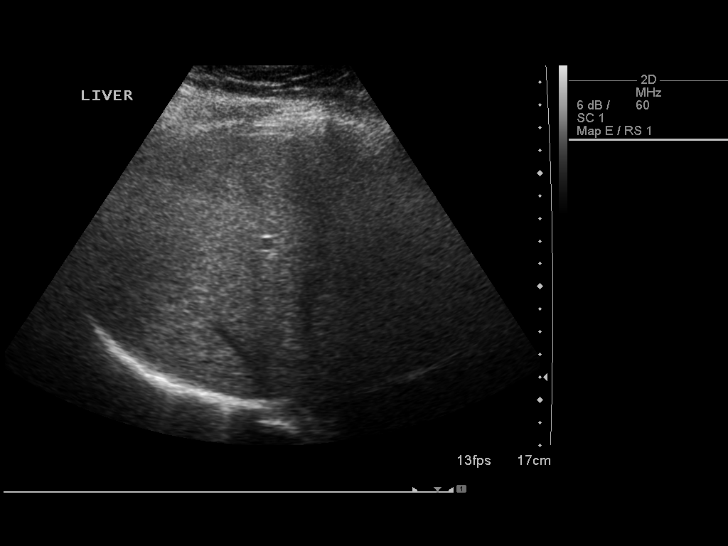
[im 32/64]
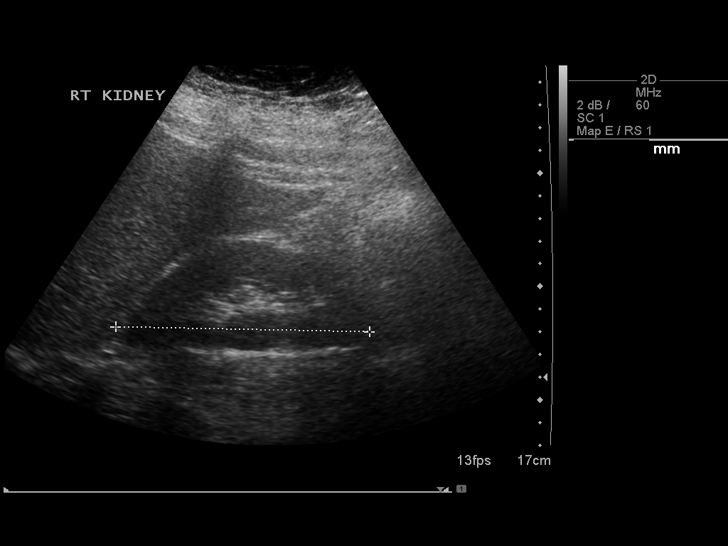
[im 37/64]
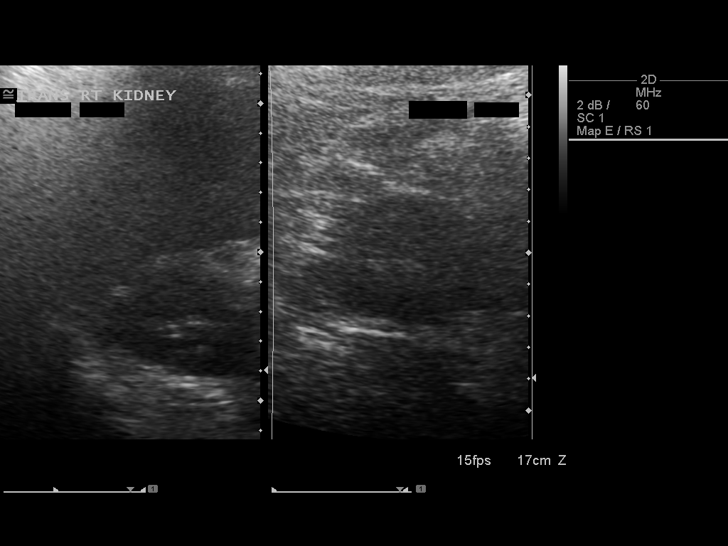
[im 43/64]
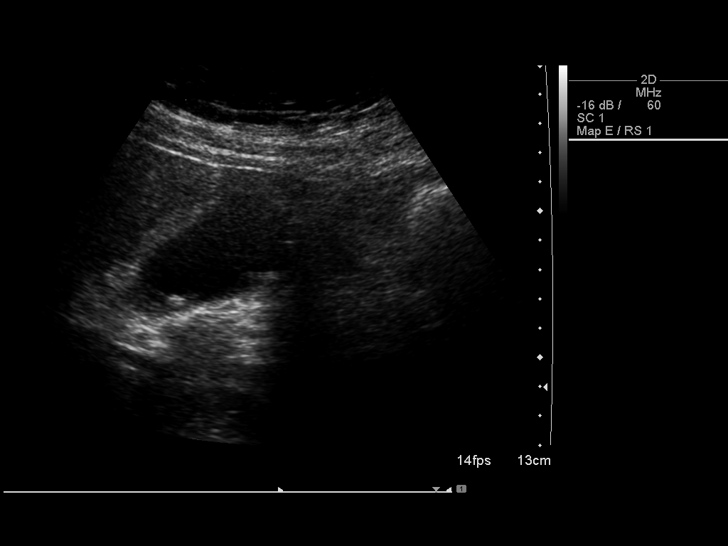
[im 48/64]
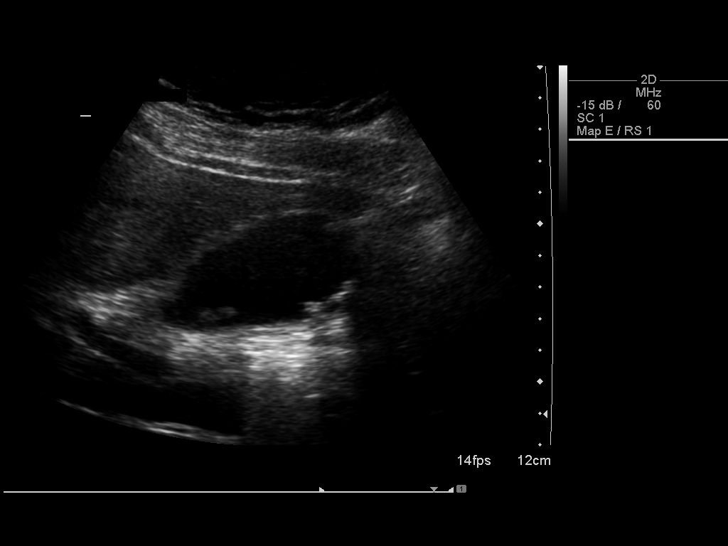
[im 53/64]
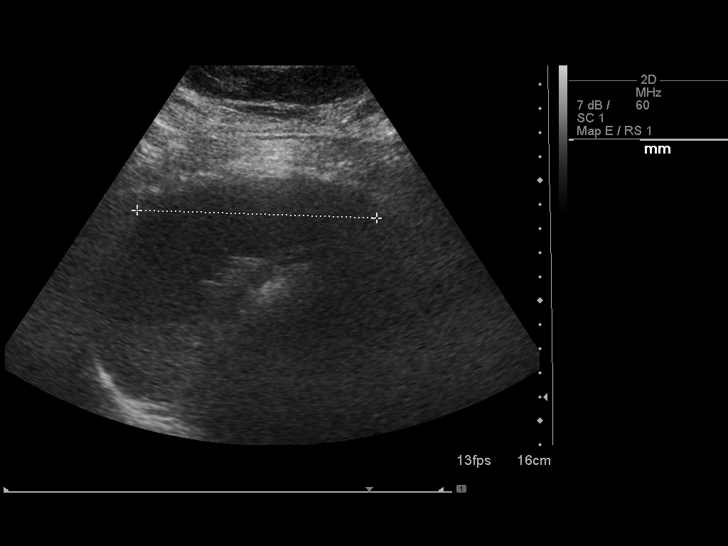
[im 58/64]
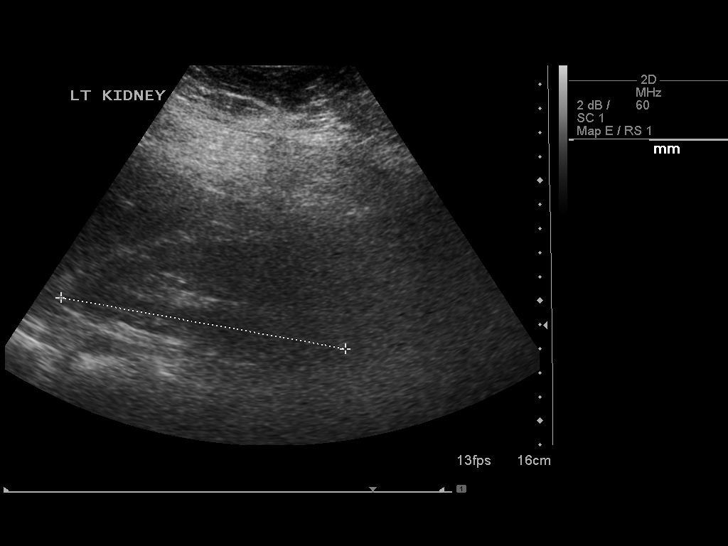
[im 64/64]
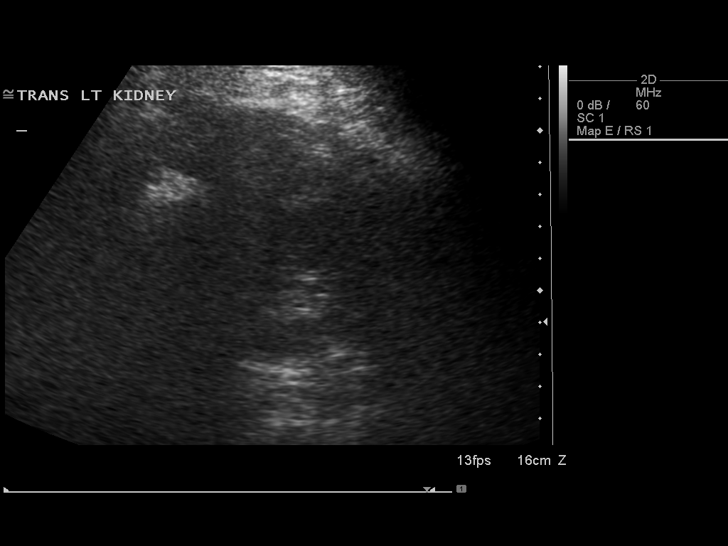

[13 of 25 positions shown; findings below may reference images not displayed]

FINDINGS: Gallbladder

Within the gallbladder, there are multiple echogenic foci which move
and shadow consistent with gallstones. There is no gallbladder wall
thickening or pericholecystic fluid collection.

Common bile duct

Diameter: 3 mm. There is no intrahepatic, common hepatic, or common
bile duct dilatation.

Liver

There is increased echogenicity in the liver consistent with a
degree of fatty change. No focal liver lesions are identified.

IVC

No abnormality visualized.

Pancreas

Visualized portion unremarkable. Portions of pancreas are obscured
by gas.

Spleen

Size and appearance within normal limits.

Right Kidney

Length: 11.2 cm. Echogenicity within normal limits. No mass or
hydronephrosis visualized.

Left Kidney

Length: 12.0 cm. Echogenicity within normal limits. No mass or
hydronephrosis visualized.

Abdominal aorta

No aneurysm visualized. Portions of aorta are difficult to visualize
due to overlying gas.
IMPRESSION: Cholelithiasis.

Fatty liver. While no focal liver lesions are identified, it must be
cautioned that the sensitivity of ultrasound for more subtle liver
lesions is diminished given underlying fatty change.

Portions of pancreas are obscured by gas. Visualized portions of
pancreas appear normal.

Study otherwise unremarkable.

## 2015-08-14 ENCOUNTER — Encounter (HOSPITAL_COMMUNITY): Payer: Self-pay | Admitting: Family Medicine

## 2015-08-14 ENCOUNTER — Emergency Department (HOSPITAL_BASED_OUTPATIENT_CLINIC_OR_DEPARTMENT_OTHER)
Admit: 2015-08-14 | Discharge: 2015-08-14 | Disposition: A | Discharge status: Home / Self Care | Payer: 59 | Attending: Emergency Medicine | Admitting: Emergency Medicine

## 2015-08-14 ENCOUNTER — Emergency Department (HOSPITAL_COMMUNITY): Payer: 59

## 2015-08-14 ENCOUNTER — Emergency Department (HOSPITAL_COMMUNITY)
Admission: EM | Admit: 2015-08-14 | Discharge: 2015-08-14 | Disposition: A | Discharge status: Home / Self Care | Payer: 59 | Attending: Emergency Medicine | Admitting: Emergency Medicine

## 2015-08-14 DIAGNOSIS — Z8719 Personal history of other diseases of the digestive system: Secondary | ICD-10-CM | POA: Insufficient documentation

## 2015-08-14 DIAGNOSIS — M1712 Unilateral primary osteoarthritis, left knee: Secondary | ICD-10-CM | POA: Diagnosis not present

## 2015-08-14 DIAGNOSIS — M79609 Pain in unspecified limb: Secondary | ICD-10-CM | POA: Diagnosis not present

## 2015-08-14 DIAGNOSIS — M25462 Effusion, left knee: Secondary | ICD-10-CM

## 2015-08-14 DIAGNOSIS — M25562 Pain in left knee: Secondary | ICD-10-CM | POA: Diagnosis present

## 2015-08-14 MED ORDER — IBUPROFEN 800 MG PO TABS
800.0000 mg | ORAL_TABLET | Freq: Once | ORAL | Status: AC
  Administered 2015-08-14: 800 mg via ORAL
  Filled 2015-08-14: qty 1

## 2015-08-14 MED ORDER — HYDROCODONE-ACETAMINOPHEN 5-325 MG PO TABS: 1.0000 | ORAL_TABLET | Freq: Four times a day (QID) | ORAL | Status: DC | PRN

## 2015-08-14 NOTE — ED Notes (Signed)
Pt here for LLE pain and swelling. Denies injury

## 2015-08-14 NOTE — Progress Notes (Signed)
*  Preliminary Results* Left lower extremity venous duplex completed. Study was very technically difficult and limited due to patient body habitus and depth of vessels. Only the left saphenofemoral junction, left common femoral, and left proximal femoral veins were visualized and found to be patent with no obvious evidence of acute thrombus. Unable to visualize the remainder of the left lower extremity veins, therefore this test is essentially inconclusive.    08/14/2015 4:35 PM  Gertie FeyMichelle Kristapher [place], RVT, RDCS, RDMS

## 2015-08-14 NOTE — ED Provider Notes (Signed)
CSN: [id number]     Arrival date & time 08/14/15  1401 History   First MD Initiated Contact with Patient 08/14/15 1738     Chief Complaint  Patient presents with  . Leg Pain     (Consider location/radiation/quality/duration/timing/severity/associated sxs/prior Treatment) HPI  40 year old female presents with left knee/leg pain for the past 2 weeks. She states her last couple days she has noticed swelling. Pain is significant only worse with any type of movement, especially getting up and walking. When lying completely still and at rest there is no pain. Does not remember any direct trauma. No right leg swelling or leg pain. Has previously had pain somewhat similar to this in the right leg was better after cortisone shot. Patient has been trying ibuprofen and Tylenol with no relief. No chest pain or shortness of breath.  Past Medical History  Diagnosis Date  . Trigeminal neuralgia     2012-PT CONTINUES TO HAVE PAIN RIGHT SIDE OF FACE  . Gallstones     HX OF SEVERAL GALLBLADDER ATTACKS  . Headache(784.0)     PT STATES MIGRAINES FROM TRIGEMINAL NEURALGIA   Past Surgical History  Procedure Laterality Date  . Abdominal hysterectomy    . Breast reduction surgery    . Cholecystectomy N/A 05/25/2013    Procedure: LAPAROSCOPIC CHOLECYSTECTOMY WITH INTRAOPERATIVE CHOLANGIOGRAM;  Surgeon: Axel FillerArmando Ramirez, MD;  Location: [facility];  Service: General;  Laterality: N/A;   History reviewed. No pertinent family history. Social History  Substance Use Topics  . Smoking status: Never Smoker   . Smokeless tobacco: Never Used  . Alcohol Use: No   OB History    No data available     Review of Systems  Respiratory: Negative for shortness of breath.   Cardiovascular: Positive for leg swelling. Negative for chest pain.  Musculoskeletal: Positive for arthralgias.  Neurological: Negative for weakness and numbness.  All other systems reviewed and are negative.     Allergies  Review of patient's  allergies indicates no known allergies.  Home Medications   Prior to Admission medications   Medication Sig Start Date End Date Taking? Authorizing Provider  acetaminophen (TYLENOL) 500 MG tablet Take 1,000 mg by mouth every 6 (six) hours as needed for pain. Pain.   Yes Historical Provider, MD  ibuprofen (ADVIL,MOTRIN) 200 MG tablet Take 400 mg by mouth every 6 (six) hours as needed for pain.   Yes Historical Provider, MD  naproxen (NAPROSYN) 500 MG tablet Take 500 mg by mouth 2 (two) times daily as needed for moderate pain.   Yes Historical Provider, MD  SUMAtriptan (IMITREX) 100 MG tablet Take 100 mg by mouth every 2 (two) hours as needed for migraine or headache. May repeat in 2 hours if headache persists or recurs.   Yes Historical Provider, MD  HYDROcodone-acetaminophen (NORCO) 5-325 MG per tablet Take 1 tablet by mouth every 4 (four) hours as needed. 08/04/14   Mancel BaleElliott Wentz, MD   BP 129/96 mmHg  Pulse 68  Temp(Src) 98.2 F (36.8 C) (Oral)  Resp 16  Ht 5\' 8"  (1.727 m)  Wt 420 lb 6 oz (190.681 kg)  BMI 63.93 kg/m2  SpO2 100% Physical Exam  Constitutional: She is oriented to person, place, and time. She appears well-developed and well-nourished.  Morbidly obese, no acute distress  HENT:  Head: Normocephalic and atraumatic.  Right Ear: External ear normal.  Left Ear: External ear normal.  Nose: Nose normal.  Eyes: Right eye exhibits no discharge. Left eye exhibits no discharge.  Cardiovascular: Normal rate, regular rhythm and normal heart sounds.   Pulses:      Dorsalis pedis pulses are 2+ on the right side, and 2+ on the left side.  Pulmonary/Chest: Effort normal and breath sounds normal.  Abdominal: Soft. There is no tenderness.  Musculoskeletal:       Left knee: Tenderness found.       Left ankle: She exhibits normal range of motion. No tenderness.       Left upper leg: She exhibits no tenderness.       Left lower leg: She exhibits no tenderness.       Left foot: There  is normal range of motion and no tenderness.  No appreciable difference in lower extremities when comparing size. Mild non focal tenderness to medial aspect of knee, more with ROM. Mostly medial to patella  Neurological: She is alert and oriented to person, place, and time.  Skin: Skin is warm and dry.  Nursing note and vitals reviewed.   ED Course  Procedures (including critical care time) Labs Review Labs Reviewed - No data to display  Imaging Review Dg Knee Complete 4 Views Left  08/14/2015  CLINICAL DATA:  Left knee pain and swelling for 2 weeks. EXAM: LEFT KNEE - COMPLETE 4+ VIEW COMPARISON:  None. FINDINGS: Moderate effusion in the suprapatellar bursa. Tricompartmental spurring. Fabella noted. Medial compartmental articular space narrowing and patellofemoral narrowing. No discrete fracture identified. IMPRESSION: 1. Tricompartmental osteoarthritis. 2. Moderate knee effusion, cause uncertain. Electronically Signed   By: Gaylyn Rong M.D.   On: 08/14/2015 19:28      DVT ultrasound left leg Left lower extremity venous duplex completed. Study was very technically difficult and limited due to patient body habitus and depth of vessels. Only the left saphenofemoral junction, left common femoral, and left proximal femoral veins were visualized and found to be patent with no obvious evidence of acute thrombus. Unable to visualize the remainder of the left lower extremity veins, therefore this test is essentially inconclusive.   08/14/2015 4:35 PM  Gertie Fey, RVT, RDCS, RDMS  I have personally reviewed and evaluated these images and lab results as part of my medical decision-making.   EKG Interpretation None      MDM   Final diagnoses:  Arthritis of left knee  Knee effusion, left    Patient with atraumatic left leg/knee pain. Ultrasound is negative for DVT. It is somewhat limited but there is no deep vein clot seen. No erythema or warmth to left knee. Xray shows no  fracture but significant osteo-arthritis and joint effusion. Doubt septic arthritis with no systemic symptoms or signs of infection. Has full ROM. Discussed possible arthrocentesis but patient declines. I agree with this given low suspicion for septic arthritis. Plan to treat with ice, elevation, NSAIDs and short course of pain meds. Sounds similar to prior right knee pain that she has had worked up by ortho, will refer back to them.    Pricilla Loveless, MD 08/14/15 [phone]

## 2015-10-11 ENCOUNTER — Other Ambulatory Visit: Payer: Self-pay | Admitting: Orthopedic Surgery

## 2015-10-11 DIAGNOSIS — M1712 Unilateral primary osteoarthritis, left knee: Secondary | ICD-10-CM

## 2015-10-23 ENCOUNTER — Other Ambulatory Visit: Payer: No Typology Code available for payment source

## 2015-10-28 ENCOUNTER — Ambulatory Visit
Admission: RE | Admit: 2015-10-28 | Discharge: 2015-10-28 | Disposition: A | Discharge status: Home / Self Care | Payer: 59 | Source: Ambulatory Visit | Attending: Orthopedic Surgery | Admitting: Orthopedic Surgery

## 2015-10-28 DIAGNOSIS — M1712 Unilateral primary osteoarthritis, left knee: Secondary | ICD-10-CM

## 2015-12-04 ENCOUNTER — Other Ambulatory Visit: Payer: Self-pay | Admitting: Orthopedic Surgery

## 2015-12-20 NOTE — Pre-Procedure Instructions (Signed)
    Phyllis Calhoun  12/20/2015      [id number] - [place], [place] - [address] [place] [id number] Phone: [phone] Fax: [phone]    Your procedure is scheduled on Friday, May 26  [facility] at [facility]:30 A.M.  Call this number if you have problems the morning of surgery:  [phone]               Any questions prior to surgery call [phone] Monday-Friday 8am-4pm   Remember:  Do not eat food or drink liquids after midnight on Thurs., May 25  Take these medicines the morning of surgery with A SIP OF WATER : tylenol or hydrocodone if needed, imitrex if needed              Stop NDAIDs: aspirin, aleve,naproxen,ibuprofen, Goody's.   Do not wear jewelry, make-up or nail polish.  Do not wear lotions, powders, or perfumes.  You may not wear deodorant.  Do not shave 48 hours prior to surgery.  Men may shave face and neck.  Do not bring valuables to the hospital.  [facility] is not [facility] for any belongings or valuables.  Contacts, dentures or bridgework may not be worn into surgery.  Leave your suitcase in the car.  After surgery it may be brought to your room.  For patients admitted to the hospital, discharge time will be determined by your treatment team.  Patients discharged the day of surgery will not be allowed to drive home.   Name and phone number of your driver:    Special instructions: review preparing for surgery  Please read over the following fact sheets that you were given. Pain Booklet, Coughing and Deep Breathing and Surgical Site Infection Prevention

## 2015-12-21 ENCOUNTER — Encounter (HOSPITAL_COMMUNITY)
Admission: RE | Admit: 2015-12-21 | Discharge: 2015-12-21 | Disposition: A | Discharge status: Home / Self Care | Payer: 59 | Source: Ambulatory Visit | Attending: Orthopedic Surgery | Admitting: Orthopedic Surgery

## 2015-12-21 ENCOUNTER — Encounter (HOSPITAL_COMMUNITY): Payer: Self-pay

## 2015-12-21 DIAGNOSIS — Z01812 Encounter for preprocedural laboratory examination: Secondary | ICD-10-CM | POA: Diagnosis present

## 2015-12-21 DIAGNOSIS — X58XXXA Exposure to other specified factors, initial encounter: Secondary | ICD-10-CM | POA: Diagnosis not present

## 2015-12-21 DIAGNOSIS — M1712 Unilateral primary osteoarthritis, left knee: Secondary | ICD-10-CM | POA: Diagnosis not present

## 2015-12-21 DIAGNOSIS — S83242A Other tear of medial meniscus, current injury, left knee, initial encounter: Secondary | ICD-10-CM | POA: Insufficient documentation

## 2015-12-21 HISTORY — DX: Sleep apnea, unspecified: G47.30

## 2015-12-21 HISTORY — DX: Unspecified osteoarthritis, unspecified site: M19.90

## 2015-12-21 LAB — BASIC METABOLIC PANEL
ANION GAP: 12 (ref 5–15)
BUN: 12 mg/dL (ref 6–20)
CHLORIDE: 100 mmol/L — AB (ref 101–111)
CO2: 28 mmol/L (ref 22–32)
Calcium: 9.3 mg/dL (ref 8.9–10.3)
Creatinine, Ser: 0.88 mg/dL (ref 0.44–1.00)
GFR calc Af Amer: >60 mL/min (ref >60)
GFR calc non Af Amer: >60 mL/min (ref >60)
GLUCOSE: 101 mg/dL — AB (ref 65–99)
POTASSIUM: 4.1 mmol/L (ref 3.5–5.1)
Sodium: 140 mmol/L (ref 135–145)

## 2015-12-21 LAB — CBC
HEMATOCRIT: 39.7 % (ref 36.0–46.0)
Hemoglobin: 12.5 g/dL (ref 12.0–15.0)
MCH: 26.7 pg (ref 26.0–34.0)
MCHC: 31.5 g/dL (ref 30.0–36.0)
MCV: 84.8 fL (ref 78.0–100.0)
Platelets: 342 10*3/uL (ref 150–400)
RBC: 4.68 MIL/uL (ref 3.87–5.11)
RDW: 16.3 % — ABNORMAL HIGH (ref 11.5–15.5)
WBC: 9.1 10*3/uL (ref 4.0–10.5)

## 2015-12-21 NOTE — Progress Notes (Signed)
[facility] [phone])

## 2015-12-28 MED ORDER — DEXTROSE 5 % IV SOLN
3.0000 g | INTRAVENOUS | Status: AC
  Administered 2015-12-29: 3 g via INTRAVENOUS
  Filled 2015-12-28: qty 3000

## 2015-12-28 NOTE — H&P (Signed)
  PREOPERATIVE H&P  Chief Complaint: Calhoun knee pain  HPI: Phyllis Calhoun is a 40 y.o. female who presents for evaluation of Calhoun knee pain. It has been present for many months and has been worsening. She has failed conservative measures. Pain is rated as moderate.  Past Medical History  Diagnosis Date  . Trigeminal neuralgia     2012-PT CONTINUES TO HAVE PAIN RIGHT SIDE OF FACE  . Gallstones     HX OF SEVERAL GALLBLADDER ATTACKS  . Headache(784.0)     PT STATES MIGRAINES FROM TRIGEMINAL NEURALGIA  . Arthritis     both legs  . Sleep apnea     sleep study confirmed   Past Surgical History  Procedure Laterality Date  . Abdominal hysterectomy    . Breast reduction surgery    . Cholecystectomy N/A 05/25/2013    Procedure: LAPAROSCOPIC CHOLECYSTECTOMY WITH INTRAOPERATIVE CHOLANGIOGRAM;  Surgeon: Axel FillerArmando Ramirez, MD;  Location: [facility];  Service: General;  Laterality: N/A;  . Tonsillectomy     Social History   Social History  . Marital Status: Single    Spouse Name: N/A  . Number of Children: N/A  . Years of Education: N/A   Social History Main Topics  . Smoking status: Never Smoker   . Smokeless tobacco: Never Used  . Alcohol Use: No  . Drug Use: No  . Sexual Activity: Not on file   Other Topics Concern  . Not on file   Social History Narrative   No family history on file. No Known Allergies Prior to Admission medications   Medication Sig Start Date End Date Taking? Authorizing Provider  acetaminophen (TYLENOL) 500 MG tablet Take 1,000 mg by mouth every 6 (six) hours as needed for pain. Pain.   Yes Historical Provider, MD  HYDROcodone-acetaminophen (NORCO) 5-325 MG tablet Take 1-2 tablets by mouth every 6 (six) hours as needed for severe pain. 08/14/15  Yes Pricilla LovelessScott Goldston, MD  naproxen (NAPROSYN) 500 MG tablet Take 500 mg by mouth 2 (two) times daily as needed for moderate pain.   Yes Historical Provider, MD  SUMAtriptan (IMITREX) 100 MG tablet Take 100 mg by mouth every 2  (two) hours as needed for migraine or headache. May repeat in 2 hours if headache persists or recurs.   Yes Historical Provider, MD     Positive ROS: none  All other systems have been reviewed and were otherwise negative with the exception of those mentioned in the HPI and as above.  Physical Exam: There were no vitals filed for this visit.  General: Alert, no acute distress Cardiovascular: No pedal edema Respiratory: No cyanosis, no use of accessory musculature GI: No organomegaly, abdomen is soft and non-tender Skin: No lesions in the area of chief complaint Neurologic: Sensation intact distally Psychiatric: Patient is competent for consent with normal mood and affect Lymphatic: No axillary or cervical lymphadenopathy  MUSCULOSKELETAL: Calhoun knee: +med jt line tender.  +mcmurray. -instability  Assessment/Plan: LEFT KNEE MEDIAL MENISCUS TEAR , DEGENERATIVE JOINT DISEASE Plan for Procedure(s): ARTHROSCOPY KNEE  The risks benefits and alternatives were discussed with the patient including but not limited to the risks of nonoperative treatment, versus surgical intervention including infection, bleeding, nerve injury, malunion, nonunion, hardware prominence, hardware failure, need for hardware removal, blood clots, cardiopulmonary complications, morbidity, mortality, among others, and they were willing to proceed.  Predicted outcome is good, although there will be at least a six to nine month expected recovery.  Phyllis Calhoun,Phyllis Tapanes L, MD 12/28/2015 11:16 PM

## 2015-12-29 ENCOUNTER — Encounter (HOSPITAL_COMMUNITY): Payer: Self-pay | Admitting: *Deleted

## 2015-12-29 ENCOUNTER — Encounter (HOSPITAL_COMMUNITY)
Admission: RE | Disposition: A | Discharge status: Home / Self Care | Payer: Self-pay | Source: Ambulatory Visit | Attending: Orthopedic Surgery

## 2015-12-29 ENCOUNTER — Ambulatory Visit (HOSPITAL_COMMUNITY)
Admission: RE | Admit: 2015-12-29 | Discharge: 2015-12-29 | Disposition: A | Discharge status: Home / Self Care | Payer: 59 | Source: Ambulatory Visit | Attending: Orthopedic Surgery | Admitting: Orthopedic Surgery

## 2015-12-29 ENCOUNTER — Ambulatory Visit (HOSPITAL_COMMUNITY): Payer: 59 | Admitting: Anesthesiology

## 2015-12-29 DIAGNOSIS — M94262 Chondromalacia, left knee: Secondary | ICD-10-CM | POA: Diagnosis present

## 2015-12-29 DIAGNOSIS — S83282A Other tear of lateral meniscus, current injury, left knee, initial encounter: Secondary | ICD-10-CM | POA: Diagnosis not present

## 2015-12-29 DIAGNOSIS — X58XXXA Exposure to other specified factors, initial encounter: Secondary | ICD-10-CM | POA: Insufficient documentation

## 2015-12-29 DIAGNOSIS — E669 Obesity, unspecified: Secondary | ICD-10-CM

## 2015-12-29 DIAGNOSIS — M2242 Chondromalacia patellae, left knee: Secondary | ICD-10-CM | POA: Insufficient documentation

## 2015-12-29 DIAGNOSIS — Z6841 Body Mass Index (BMI) 40.0 and over, adult: Secondary | ICD-10-CM | POA: Insufficient documentation

## 2015-12-29 DIAGNOSIS — G43909 Migraine, unspecified, not intractable, without status migrainosus: Secondary | ICD-10-CM | POA: Insufficient documentation

## 2015-12-29 DIAGNOSIS — S83242A Other tear of medial meniscus, current injury, left knee, initial encounter: Secondary | ICD-10-CM | POA: Insufficient documentation

## 2015-12-29 HISTORY — PX: KNEE ARTHROSCOPY: SHX127

## 2015-12-29 SURGERY — ARTHROSCOPY, KNEE
Anesthesia: General | Laterality: Left

## 2015-12-29 MED ORDER — ROCURONIUM BROMIDE 100 MG/10ML IV SOLN
INTRAVENOUS | Status: DC | PRN
  Administered 2015-12-29: 20 mg via INTRAVENOUS

## 2015-12-29 MED ORDER — FENTANYL CITRATE (PF) 250 MCG/5ML IJ SOLN
INTRAMUSCULAR | Status: AC
  Filled 2015-12-29: qty 5

## 2015-12-29 MED ORDER — SUCCINYLCHOLINE CHLORIDE 20 MG/ML IJ SOLN
INTRAMUSCULAR | Status: DC | PRN
  Administered 2015-12-29: 120 mg via INTRAVENOUS

## 2015-12-29 MED ORDER — LACTATED RINGERS IV SOLN
INTRAVENOUS | Status: DC | PRN
  Administered 2015-12-29: 07:00:00 via INTRAVENOUS

## 2015-12-29 MED ORDER — ONDANSETRON HCL 4 MG/2ML IJ SOLN
INTRAMUSCULAR | Status: AC
  Filled 2015-12-29: qty 2

## 2015-12-29 MED ORDER — OXYCODONE-ACETAMINOPHEN 5-325 MG PO TABS: 1.0000 | ORAL_TABLET | ORAL | Status: DC | PRN

## 2015-12-29 MED ORDER — BUPIVACAINE HCL (PF) 0.25 % IJ SOLN
INTRAMUSCULAR | Status: AC
  Filled 2015-12-29: qty 30

## 2015-12-29 MED ORDER — METOCLOPRAMIDE HCL 5 MG/ML IJ SOLN: 10.0000 mg | Freq: Once | INTRAMUSCULAR | Status: DC | PRN

## 2015-12-29 MED ORDER — CHLORHEXIDINE GLUCONATE 4 % EX LIQD: 60.0000 mL | Freq: Once | CUTANEOUS | Status: DC

## 2015-12-29 MED ORDER — EPINEPHRINE HCL 1 MG/ML IJ SOLN
INTRAMUSCULAR | Status: DC | PRN
  Administered 2015-12-29: 1 mg

## 2015-12-29 MED ORDER — MEPERIDINE HCL 25 MG/ML IJ SOLN: 6.2500 mg | INTRAMUSCULAR | Status: DC | PRN

## 2015-12-29 MED ORDER — KETOROLAC TROMETHAMINE 30 MG/ML IJ SOLN
INTRAMUSCULAR | Status: DC | PRN
  Administered 2015-12-29: 30 mg via INTRAVENOUS

## 2015-12-29 MED ORDER — GLYCOPYRROLATE 0.2 MG/ML IJ SOLN
INTRAMUSCULAR | Status: DC | PRN
  Administered 2015-12-29: 0.6 mg via INTRAVENOUS

## 2015-12-29 MED ORDER — FENTANYL CITRATE (PF) 100 MCG/2ML IJ SOLN
INTRAMUSCULAR | Status: AC
  Administered 2015-12-29: 50 ug via INTRAVENOUS
  Filled 2015-12-29: qty 2

## 2015-12-29 MED ORDER — SODIUM CHLORIDE 0.9 % IR SOLN
Status: DC | PRN
  Administered 2015-12-29 (×2): 3000 mL

## 2015-12-29 MED ORDER — FENTANYL CITRATE (PF) 100 MCG/2ML IJ SOLN
INTRAMUSCULAR | Status: DC | PRN
Start: 2015-12-29 — End: 2015-12-29
  Administered 2015-12-29: 50 ug via INTRAVENOUS
  Administered 2015-12-29: 100 ug via INTRAVENOUS
  Administered 2015-12-29: 50 ug via INTRAVENOUS

## 2015-12-29 MED ORDER — FENTANYL CITRATE (PF) 100 MCG/2ML IJ SOLN
25.0000 ug | INTRAMUSCULAR | Status: DC | PRN
  Administered 2015-12-29: 50 ug via INTRAVENOUS

## 2015-12-29 MED ORDER — ROCURONIUM BROMIDE 50 MG/5ML IV SOLN
INTRAVENOUS | Status: AC
Start: 2015-12-29 — End: 2015-12-29
  Filled 2015-12-29: qty 1

## 2015-12-29 MED ORDER — METOCLOPRAMIDE HCL 5 MG/ML IJ SOLN
INTRAMUSCULAR | Status: AC
  Filled 2015-12-29: qty 2

## 2015-12-29 MED ORDER — NEOSTIGMINE METHYLSULFATE 10 MG/10ML IV SOLN
INTRAVENOUS | Status: DC | PRN
  Administered 2015-12-29: 4 mg via INTRAVENOUS

## 2015-12-29 MED ORDER — KETOROLAC TROMETHAMINE 30 MG/ML IJ SOLN
INTRAMUSCULAR | Status: AC
  Filled 2015-12-29: qty 1

## 2015-12-29 MED ORDER — EPINEPHRINE HCL 1 MG/ML IJ SOLN
INTRAMUSCULAR | Status: AC
  Filled 2015-12-29: qty 1

## 2015-12-29 MED ORDER — MIDAZOLAM HCL 2 MG/2ML IJ SOLN
INTRAMUSCULAR | Status: AC
  Filled 2015-12-29: qty 2

## 2015-12-29 MED ORDER — PHENYLEPHRINE 40 MCG/ML (10ML) SYRINGE FOR IV PUSH (FOR BLOOD PRESSURE SUPPORT)
PREFILLED_SYRINGE | INTRAVENOUS | Status: AC
  Filled 2015-12-29: qty 10

## 2015-12-29 MED ORDER — PROPOFOL 10 MG/ML IV BOLUS
INTRAVENOUS | Status: DC | PRN
  Administered 2015-12-29: 200 mg via INTRAVENOUS

## 2015-12-29 MED ORDER — DEXAMETHASONE SODIUM PHOSPHATE 10 MG/ML IJ SOLN
INTRAMUSCULAR | Status: DC | PRN
  Administered 2015-12-29: 10 mg via INTRAVENOUS

## 2015-12-29 MED ORDER — ONDANSETRON HCL 4 MG/2ML IJ SOLN
INTRAMUSCULAR | Status: DC | PRN
  Administered 2015-12-29: 4 mg via INTRAVENOUS

## 2015-12-29 MED ORDER — LIDOCAINE HCL (CARDIAC) 20 MG/ML IV SOLN
INTRAVENOUS | Status: DC | PRN
  Administered 2015-12-29: 80 mg via INTRAVENOUS

## 2015-12-29 MED ORDER — MIDAZOLAM HCL 5 MG/5ML IJ SOLN
INTRAMUSCULAR | Status: DC | PRN
  Administered 2015-12-29: 2 mg via INTRAVENOUS

## 2015-12-29 MED ORDER — BUPIVACAINE HCL (PF) 0.25 % IJ SOLN
INTRAMUSCULAR | Status: DC | PRN
  Administered 2015-12-29: 10 mL

## 2015-12-29 MED ORDER — DEXAMETHASONE SODIUM PHOSPHATE 10 MG/ML IJ SOLN
INTRAMUSCULAR | Status: AC
  Filled 2015-12-29: qty 1

## 2015-12-29 MED ORDER — PROPOFOL 10 MG/ML IV BOLUS
INTRAVENOUS | Status: AC
  Filled 2015-12-29: qty 20

## 2015-12-29 SURGICAL SUPPLY — 38 items
BANDAGE ELASTIC 6 VELCRO ST LF (GAUZE/BANDAGES/DRESSINGS) ×2 IMPLANT
BLADE CUDA 5.5 (BLADE) IMPLANT
BLADE CUTTER GATOR 3.5 (BLADE) IMPLANT
BLADE GREAT WHITE 4.2 (BLADE) ×2 IMPLANT
BNDG GAUZE ELAST 4 BULKY (GAUZE/BANDAGES/DRESSINGS) ×2 IMPLANT
CUFF TOURNIQUET SINGLE 34IN LL (TOURNIQUET CUFF) IMPLANT
CUFF TOURNIQUET SINGLE 44IN (TOURNIQUET CUFF) ×1 IMPLANT
DRAPE ARTHROSCOPY W/POUCH 114 (DRAPES) ×2 IMPLANT
DRAPE PROXIMA HALF (DRAPES) ×2 IMPLANT
DRAPE U-SHAPE 47X51 STRL (DRAPES) ×2 IMPLANT
DRSG EMULSION OIL 3X3 NADH (GAUZE/BANDAGES/DRESSINGS) ×2 IMPLANT
DRSG PAD ABDOMINAL 8X10 ST (GAUZE/BANDAGES/DRESSINGS) ×2 IMPLANT
DURAPREP 26ML APPLICATOR (WOUND CARE) ×2 IMPLANT
FILTER STRAW FLUID ASPIR (MISCELLANEOUS) ×2 IMPLANT
GAUZE SPONGE 4X4 12PLY STRL (GAUZE/BANDAGES/DRESSINGS) ×2 IMPLANT
GLOVE BIOGEL PI IND STRL 8 (GLOVE) ×2 IMPLANT
GLOVE BIOGEL PI INDICATOR 8 (GLOVE) ×2
GLOVE ECLIPSE 7.5 STRL STRAW (GLOVE) ×4 IMPLANT
GOWN STRL REUS W/ TWL LRG LVL3 (GOWN DISPOSABLE) ×1 IMPLANT
GOWN STRL REUS W/ TWL XL LVL3 (GOWN DISPOSABLE) ×2 IMPLANT
GOWN STRL REUS W/TWL LRG LVL3 (GOWN DISPOSABLE) ×2
GOWN STRL REUS W/TWL XL LVL3 (GOWN DISPOSABLE) ×4
HOVERMATT SINGLE USE (MISCELLANEOUS) ×1 IMPLANT
KIT BASIN OR (CUSTOM PROCEDURE TRAY) ×2 IMPLANT
KIT ROOM TURNOVER OR (KITS) ×2 IMPLANT
NDL 18GX1X1/2 (RX/OR ONLY) (NEEDLE) ×1 IMPLANT
NEEDLE 18GX1X1/2 (RX/OR ONLY) (NEEDLE) ×2 IMPLANT
PACK ARTHROSCOPY DSU (CUSTOM PROCEDURE TRAY) ×2 IMPLANT
PAD ARMBOARD 7.5X6 YLW CONV (MISCELLANEOUS) ×4 IMPLANT
PAD CAST 4YDX4 CTTN HI CHSV (CAST SUPPLIES) ×2 IMPLANT
PADDING CAST COTTON 4X4 STRL (CAST SUPPLIES) ×2
SET ARTHROSCOPY TUBING (MISCELLANEOUS) ×2
SET ARTHROSCOPY TUBING LN (MISCELLANEOUS) ×1 IMPLANT
SUT ETHILON 4 0 PS 2 18 (SUTURE) ×2 IMPLANT
SYR 5ML LL (SYRINGE) ×2 IMPLANT
TOWEL OR 17X24 6PK STRL BLUE (TOWEL DISPOSABLE) ×2 IMPLANT
TOWEL OR 17X26 10 PK STRL BLUE (TOWEL DISPOSABLE) ×2 IMPLANT
WRAP KNEE MAXI GEL POST OP (GAUZE/BANDAGES/DRESSINGS) ×2 IMPLANT

## 2015-12-29 NOTE — Transfer of Care (Signed)
Immediate Anesthesia Transfer of Care Note  Patient: Phyllis Calhoun  Procedure(s) Performed: Procedure(s): ARTHROSCOPY KNEE (Left)  Patient Location: PACU  Anesthesia Type:General  Level of Consciousness: awake, alert  and oriented  Airway & Oxygen Therapy: Patient connected to face mask oxygen  Post-op Assessment: Post -op Vital signs reviewed and stable  Post vital signs: stable  Last Vitals:  Filed Vitals:   12/29/15 0556 12/29/15 0841  Pulse: 105   Temp: 37.1 C 36.7 C  Resp: 20     Last Pain: There were no vitals filed for this visit.       Complications: No apparent anesthesia complications

## 2015-12-29 NOTE — Progress Notes (Signed)
All documentation done by Sharlot Gowdaonda Hunt Rn under by name by accident.

## 2015-12-29 NOTE — Anesthesia Procedure Notes (Signed)
Procedure Name: Intubation Date/Time: 12/29/2015 7:48 AM Performed by: Dorie RankQUINN, Shaketta Rill M Pre-anesthesia Checklist: Patient identified, Emergency Drugs available, Suction available, Patient being monitored and Timeout performed Patient Re-evaluated:Patient Re-evaluated prior to inductionOxygen Delivery Method: Circle system utilized Preoxygenation: Pre-oxygenation with 100% oxygen Intubation Type: IV induction Ventilation: Mask ventilation without difficulty Laryngoscope Size: Mac and 4 Grade View: Grade I Tube type: Oral Tube size: 7.0 mm Number of attempts: 1 Airway Equipment and Method: Stylet Placement Confirmation: ETT inserted through vocal cords under direct vision,  breath sounds checked- equal and bilateral and positive ETCO2 Secured at: 22 cm Tube secured with: Tape Dental Injury: Teeth and Oropharynx as per pre-operative assessment  Difficulty Due To: Difficulty was anticipated

## 2015-12-29 NOTE — Discharge Instructions (Signed)
POST-OP KNEE ARTHROSCOPY INSTRUCTIONS  °Dr. John Graves/Jim Marisol Giambra PA-C ° °Pain °You will be expected to have a moderate amount of pain in the affected knee for approximately two weeks. However, the first two days will be the most severe pain. A prescription has been provided to take as needed for the pain. The pain can be reduced by applying ice packs to the knee for the first 1-2 weeks post surgery. Also, keeping the leg elevated on pillows will help alleviate the pain. If you develop any acute pain or swelling in your calf muscle, please call the doctor. ° °Activity °It is preferred that you stay at bed rest for approximately 24 hours. However, you may go to the bathroom with help. Weight bearing as tolerated. You may begin the knee exercises the day of surgery. Discontinue crutches as the knee pain resolves. ° °Dressing °Keep the dressing dry. If the ace bandage should wrinkle or roll up, this can be rewrapped to prevent ridges in the bandage. You may remove all dressings in 48 hours,  apply bandaids to each wound. You may shower on the 4th day after surgery but no tub bath. ° °Symptoms to report to your doctor °Extreme pain °Extreme swelling °Temperature above 101 degrees °Change in the feeling, color, or movement of your toes °Redness, heat, or swelling at your incision ° °Exercise °If is preferred that as soon as possible you try to do a straight leg raise without bending the knee and concentrate on bringing the heel of your foot off the bed up to approximately 45 degrees and hold for the count of 10 seconds. Repeat this at least 10 times three or four times per day. Additional exercises are provided below. ° °You are encouraged to bend the knee as tolerated. ° °Follow-Up °Call to schedule a follow-up appointment in 5-7 days. Our office # is [phone]. ° °POST-OP EXERCISES ° °Short Arc Quads ° °1. Lie on back with legs straight. Place towel roll under thigh, just above knee. °2. Tighten thigh muscles to  straighten knee and lift heel off bed. °3. Hold for slow count of five, then lower. °4. Do three sets of ten ° ° ° °Straight Leg Raises ° °1. Lie on back with operative leg straight and other leg bent. °2. Keeping operative leg completely straight, slowly lift operative leg so foot is 5 inches off bed. °3. Hold for slow count of five, then lower. °4. Do three sets of ten. ° ° ° °DO BOTH EXERCISES 2 TIMES A DAY ° °Ankle Pumps ° °Work/move the operative ankle and foot up and down 10 times every hour while awake. °

## 2015-12-29 NOTE — Brief Op Note (Signed)
12/29/2015  8:20 AM  PATIENT:  Elie GoodyFelicia A Webster  40 y.o. female  PRE-OPERATIVE DIAGNOSIS:  LEFT KNEE MEDIAL MENISCUS TEAR , DEGENERATIVE JOINT DISEASE  POST-OPERATIVE DIAGNOSIS:  LEFT KNEE MEDIAL MENISCUS TEAR , DEGENERATIVE JOINT   PROCEDURE:  Procedure(s): ARTHROSCOPY KNEE (Left)  SURGEON:  Surgeon(s) and Role:    * Jodi GeraldsJohn Lakendria Nicastro, MD - Primary  PHYSICIAN ASSISTANT:   ASSISTANTS: bethune   ANESTHESIA:   general  EBL:     BLOOD ADMINISTERED:none  DRAINS: none   LOCAL MEDICATIONS USED:  MARCAINE     SPECIMEN:  No Specimen  DISPOSITION OF SPECIMEN:  N/A  COUNTS:  YES  TOURNIQUET:  * No tourniquets in log *  DICTATION: .Other Dictation: Dictation Number [id number]  PLAN OF CARE: Discharge to home after PACU  PATIENT DISPOSITION:  PACU - hemodynamically stable.   Delay start of Pharmacological VTE agent (>24hrs) due to surgical blood loss or risk of bleeding: no

## 2015-12-29 NOTE — Anesthesia Preprocedure Evaluation (Addendum)
Anesthesia Evaluation  Patient identified by MRN, date of birth, ID band Patient awake    Reviewed: Allergy & Precautions, NPO status , Patient's Chart, lab work & pertinent test results  Airway Mallampati: I  TM Distance: >3 FB     Dental  (+) Teeth Intact, Dental Advisory Given   Pulmonary sleep apnea ,    breath sounds clear to auscultation       Cardiovascular negative cardio ROS   Rhythm:Regular Rate:Normal     Neuro/Psych    GI/Hepatic negative GI ROS, Neg liver ROS,   Endo/Other  Morbid obesityMassively obese  Renal/GU negative Renal ROS     Musculoskeletal  (+) Arthritis ,   Abdominal (+) + obese,   Peds  Hematology   Anesthesia Other Findings   Reproductive/Obstetrics                            Anesthesia Physical Anesthesia Plan  ASA: III  Anesthesia Plan: General   Post-op Pain Management:    Induction: Intravenous  Airway Management Planned: Oral ETT  Additional Equipment:   Intra-op Plan:   Post-operative Plan: Extubation in OR  Informed Consent: I have reviewed the patients History and Physical, chart, labs and discussed the procedure including the risks, benefits and alternatives for the proposed anesthesia with the patient or authorized representative who has indicated his/her understanding and acceptance.   Dental advisory given  Plan Discussed with:   Anesthesia Plan Comments:         Anesthesia Quick Evaluation

## 2015-12-29 NOTE — Anesthesia Postprocedure Evaluation (Signed)
Anesthesia Post Note  Patient: Ambermarie A Webster  Procedure(s) Performed: Procedure(s) (LRB): ARTHROSCOPY KNEE (Left)  Patient location during evaluation: PACU Anesthesia Type: General Level of consciousness: awake and alert and oriented Pain management: pain level controlled Vital Signs Assessment: post-procedure vital signs reviewed and stable Respiratory status: spontaneous breathing, nonlabored ventilation and respiratory function stable Cardiovascular status: blood pressure returned to baseline and stable Postop Assessment: no signs of nausea or vomiting Anesthetic complications: no    Last Vitals:  Filed Vitals:   12/29/15 1000 12/29/15 1022  BP: 111/46 108/69  Pulse: 75 73  Temp: 36.3 C   Resp: 14     Last Pain:  Filed Vitals:   12/29/15 1023  PainSc: 2                  Kevona Lupinacci A.

## 2015-12-30 NOTE — Op Note (Signed)
NAMTheodoro Doing:  Calhoun, Phyllis             ACCOUNT NO.:  [id number]  MEDICAL RECORD NO.:  [id number]  LOCATION:  [facility]                         FACILITY:  [facility]  PHYSICIAN:  Harvie JuniorJohn L. Jaaziel Peatross, M.D.   DATE OF BIRTH:  12-28-1975  DATE OF PROCEDURE:  12/29/2015 DATE OF DISCHARGE:  12/29/2015                              OPERATIVE REPORT   PREOPERATIVE DIAGNOSES:  Chondromalacia, patellofemoral joint and questionable chondromalacia in remainder of the knee and questionable loose bodies in the patellofemoral joint.  POSTOPERATIVE DIAGNOSES: 1. Severe chondromalacia of patellofemoral joint. 2. Small medial meniscal tear in the posterior horn. 3. Small lateral meniscal tear in the midbody.  PROCEDURES: 1. Partial medial and partial lateral meniscectomies. 2. Chondroplasty of patellofemoral joint down to bleeding bone. 3. Anterior synovectomy.  SURGEON:  Harvie JuniorJohn L. Raylea Adcox, M.D.  ASSISTANT:  Marshia LyJames Bethune, P.A.  ANESTHESIA:  General.  BRIEF HISTORY:  Ms. Phyllis Calhoun is a 40 year old female with long history of significant complaints of left knee pain.  She had been treated conservatively for prolonged period of time.  After failure of conservative care including injection therapy, activity modification, attempts at weight loss, she was taken to the operating room for left knee arthroscopy.  Preoperative MRI showed significant issues with the patellofemoral joint.  DESCRIPTION OF PROCEDURE:  The patient was taken to the operating room. After adequate anesthesia was obtained with general anesthetic, the patient was placed supine on the operating table.  The left leg was prepped and draped in usual sterile fashion.  Following this, routine arthroscopic examination of the knee revealed that there was mild meniscal fraying in the posterior horn of the medial meniscus.  This was debrided back to a smooth and stable rim.  There were some grade 2 changes on the medial femoral condyle, which were debrided to  a smooth rim.  Attention was turned to the ACL normal.  Attention was turned to the lateral side, but the lateral femoral condyle looked quite good. The lateral meniscus in the midbody had some fraying, was debrided. Once this was completed, attention was turned toward the patellofemoral joint, where unfortunately, there was severe chondromalacia of the patellofemoral joint.  There was flaps, fibrillated cartilage and multiple loose bodies.  This was debrided back to a smooth and stable rim down to bleeding bone.  Once this was done, the knee was copiously, thoroughly irrigated and suctioned dry.  We did go up into the synovial reflection superiorly and so we did, we were able to debride synovium and any loose bodies up in that area.  Once this was done, the knee was irrigated, suctioned dry, closed in layers.  Sterile compressive dressing was applied.  The patient was taken to the recovery room, she was noted to be in satisfactory condition.  Estimated blood loss for the procedure was minimal.     Harvie JuniorJohn L. Ciclaly Mulcahey, M.D.     Ranae PlumberJLG/MEDQ  D:  12/29/2015  T:  12/30/2015  Job:  [id number]

## 2016-01-01 ENCOUNTER — Encounter (HOSPITAL_COMMUNITY): Payer: Self-pay | Admitting: Orthopedic Surgery

## 2016-01-09 ENCOUNTER — Ambulatory Visit (HOSPITAL_COMMUNITY)
Admission: RE | Admit: 2016-01-09 | Discharge: 2016-01-09 | Disposition: A | Discharge status: Home / Self Care | Payer: 59 | Source: Ambulatory Visit | Attending: Family Medicine | Admitting: Family Medicine

## 2016-01-09 ENCOUNTER — Other Ambulatory Visit (HOSPITAL_COMMUNITY): Payer: Self-pay | Admitting: Orthopedic Surgery

## 2016-01-09 DIAGNOSIS — M79662 Pain in left lower leg: Secondary | ICD-10-CM

## 2016-01-09 DIAGNOSIS — M79605 Pain in left leg: Secondary | ICD-10-CM | POA: Diagnosis present

## 2016-01-09 DIAGNOSIS — M7989 Other specified soft tissue disorders: Secondary | ICD-10-CM | POA: Diagnosis not present

## 2016-01-09 NOTE — Progress Notes (Signed)
VASCULAR LAB PRELIMINARY  PRELIMINARY  PRELIMINARY  PRELIMINARY  Left lower extremity venous duplex completed.    Preliminary report:  Left:  No obvious evidence of DVT, superficial thrombosis, or Baker's cyst.  Phyllis Calhoun, RVS 01/09/2016, 2:59 PM

## 2016-01-20 ENCOUNTER — Emergency Department (HOSPITAL_COMMUNITY): Payer: 59

## 2016-01-20 ENCOUNTER — Other Ambulatory Visit: Payer: Self-pay

## 2016-01-20 ENCOUNTER — Observation Stay (HOSPITAL_COMMUNITY)
Admission: EM | Admit: 2016-01-20 | Discharge: 2016-01-21 | Disposition: A | Discharge status: Home / Self Care | Payer: 59 | Attending: Internal Medicine | Admitting: Internal Medicine

## 2016-01-20 ENCOUNTER — Encounter (HOSPITAL_COMMUNITY): Payer: Self-pay

## 2016-01-20 DIAGNOSIS — R0602 Shortness of breath: Secondary | ICD-10-CM | POA: Diagnosis present

## 2016-01-20 DIAGNOSIS — R0789 Other chest pain: Secondary | ICD-10-CM | POA: Diagnosis present

## 2016-01-20 DIAGNOSIS — R071 Chest pain on breathing: Secondary | ICD-10-CM

## 2016-01-20 DIAGNOSIS — E669 Obesity, unspecified: Secondary | ICD-10-CM | POA: Diagnosis present

## 2016-01-20 DIAGNOSIS — G500 Trigeminal neuralgia: Secondary | ICD-10-CM

## 2016-01-20 DIAGNOSIS — R079 Chest pain, unspecified: Secondary | ICD-10-CM | POA: Diagnosis present

## 2016-01-20 LAB — BASIC METABOLIC PANEL
Anion gap: 5 (ref 5–15)
BUN: 7 mg/dL (ref 6–20)
CALCIUM: 8.9 mg/dL (ref 8.9–10.3)
CHLORIDE: 108 mmol/L (ref 101–111)
CO2: 25 mmol/L (ref 22–32)
CREATININE: 0.8 mg/dL (ref 0.44–1.00)
GFR calc non Af Amer: >60 mL/min (ref >60)
Glucose, Bld: 108 mg/dL — ABNORMAL HIGH (ref 65–99)
Potassium: 4.6 mmol/L (ref 3.5–5.1)
SODIUM: 138 mmol/L (ref 135–145)

## 2016-01-20 LAB — TROPONIN I: Troponin I: <0.03 ng/mL (ref <0.031)

## 2016-01-20 LAB — CBC
HCT: 36.2 % (ref 36.0–46.0)
Hemoglobin: 10.8 g/dL — ABNORMAL LOW (ref 12.0–15.0)
MCH: 24.8 pg — AB (ref 26.0–34.0)
MCHC: 29.8 g/dL — AB (ref 30.0–36.0)
MCV: 83.2 fL (ref 78.0–100.0)
PLATELETS: 303 10*3/uL (ref 150–400)
RBC: 4.35 MIL/uL (ref 3.87–5.11)
RDW: 16.3 % — AB (ref 11.5–15.5)
WBC: 7.5 10*3/uL (ref 4.0–10.5)

## 2016-01-20 LAB — I-STAT TROPONIN, ED
TROPONIN I, POC: 0 ng/mL (ref 0.00–0.08)
Troponin i, poc: 0 ng/mL (ref 0.00–0.08)

## 2016-01-20 LAB — D-DIMER, QUANTITATIVE (NOT AT ARMC): D DIMER QUANT: 0.6 ug{FEU}/mL — AB (ref 0.00–0.50)

## 2016-01-20 MED ORDER — ASPIRIN EC 81 MG PO TBEC: 81.0000 mg | DELAYED_RELEASE_TABLET | Freq: Every day | ORAL | Status: DC

## 2016-01-20 MED ORDER — MAGNESIUM CITRATE PO SOLN: 1.0000 | Freq: Once | ORAL | Status: DC | PRN

## 2016-01-20 MED ORDER — IOPAMIDOL (ISOVUE-370) INJECTION 76%
INTRAVENOUS | Status: AC
  Administered 2016-01-20: 100 mL
  Filled 2016-01-20: qty 100

## 2016-01-20 MED ORDER — ONDANSETRON HCL 4 MG/2ML IJ SOLN: 4.0000 mg | Freq: Four times a day (QID) | INTRAMUSCULAR | Status: DC | PRN

## 2016-01-20 MED ORDER — ENOXAPARIN SODIUM 40 MG/0.4ML ~~LOC~~ SOLN: 40.0000 mg | SUBCUTANEOUS | Status: DC

## 2016-01-20 MED ORDER — ACETAMINOPHEN 325 MG PO TABS: 650.0000 mg | ORAL_TABLET | Freq: Four times a day (QID) | ORAL | Status: DC | PRN

## 2016-01-20 MED ORDER — OXYCODONE-ACETAMINOPHEN 5-325 MG PO TABS
1.0000 | ORAL_TABLET | Freq: Once | ORAL | Status: AC
  Administered 2016-01-20: 1 via ORAL
  Filled 2016-01-20: qty 1

## 2016-01-20 MED ORDER — SUMATRIPTAN SUCCINATE 100 MG PO TABS: 100.0000 mg | ORAL_TABLET | ORAL | Status: DC | PRN

## 2016-01-20 MED ORDER — MORPHINE SULFATE (PF) 2 MG/ML IV SOLN
2.0000 mg | INTRAVENOUS | Status: DC | PRN
  Administered 2016-01-21: 2 mg via INTRAVENOUS
  Filled 2016-01-20: qty 1

## 2016-01-20 MED ORDER — SENNOSIDES-DOCUSATE SODIUM 8.6-50 MG PO TABS: 1.0000 | ORAL_TABLET | Freq: Every evening | ORAL | Status: DC | PRN

## 2016-01-20 MED ORDER — ENOXAPARIN SODIUM 40 MG/0.4ML ~~LOC~~ SOLN
40.0000 mg | SUBCUTANEOUS | Status: DC
  Administered 2016-01-20: 40 mg via SUBCUTANEOUS
  Filled 2016-01-20: qty 0.4

## 2016-01-20 MED ORDER — SODIUM CHLORIDE 0.9% FLUSH
3.0000 mL | Freq: Two times a day (BID) | INTRAVENOUS | Status: DC
  Administered 2016-01-20: 3 mL via INTRAVENOUS

## 2016-01-20 MED ORDER — MORPHINE SULFATE (PF) 2 MG/ML IV SOLN: 1.0000 mg | INTRAVENOUS | Status: DC | PRN

## 2016-01-20 MED ORDER — GI COCKTAIL ~~LOC~~: 30.0000 mL | Freq: Four times a day (QID) | ORAL | Status: DC | PRN

## 2016-01-20 MED ORDER — ACETAMINOPHEN 650 MG RE SUPP: 650.0000 mg | Freq: Four times a day (QID) | RECTAL | Status: DC | PRN

## 2016-01-20 MED ORDER — ASPIRIN 81 MG PO CHEW
324.0000 mg | CHEWABLE_TABLET | Freq: Once | ORAL | Status: AC
  Administered 2016-01-20: 324 mg via ORAL
  Filled 2016-01-20: qty 4

## 2016-01-20 MED ORDER — HYDROCODONE-ACETAMINOPHEN 5-325 MG PO TABS
1.0000 | ORAL_TABLET | ORAL | Status: DC | PRN
  Administered 2016-01-20 – 2016-01-21 (×2): 2 via ORAL
  Filled 2016-01-20 (×2): qty 2

## 2016-01-20 MED ORDER — SODIUM CHLORIDE 0.9 % IV SOLN
INTRAVENOUS | Status: DC
  Administered 2016-01-20: 75 mL/h via INTRAVENOUS
  Administered 2016-01-21: 11:00:00 via INTRAVENOUS

## 2016-01-20 MED ORDER — TRAZODONE HCL 50 MG PO TABS: 25.0000 mg | ORAL_TABLET | Freq: Every evening | ORAL | Status: DC | PRN

## 2016-01-20 MED ORDER — SODIUM CHLORIDE 0.9 % IV SOLN: INTRAVENOUS | Status: DC

## 2016-01-20 MED ORDER — ASPIRIN EC 81 MG PO TBEC
81.0000 mg | DELAYED_RELEASE_TABLET | Freq: Every day | ORAL | Status: DC
  Administered 2016-01-21: 81 mg via ORAL
  Filled 2016-01-20: qty 1

## 2016-01-20 MED ORDER — BISACODYL 10 MG RE SUPP: 10.0000 mg | Freq: Every day | RECTAL | Status: DC | PRN

## 2016-01-20 MED ORDER — MORPHINE SULFATE (PF) 4 MG/ML IV SOLN
4.0000 mg | Freq: Once | INTRAVENOUS | Status: AC
  Administered 2016-01-20: 4 mg via INTRAVENOUS
  Filled 2016-01-20: qty 1

## 2016-01-20 MED ORDER — ONDANSETRON HCL 4 MG PO TABS: 4.0000 mg | ORAL_TABLET | Freq: Four times a day (QID) | ORAL | Status: DC | PRN

## 2016-01-20 NOTE — ED Notes (Signed)
Attempted report 

## 2016-01-20 NOTE — ED Notes (Signed)
Attempted report to [facility]W (new bed assignment)

## 2016-01-20 NOTE — ED Notes (Signed)
Patient complains of chest tightness with tingling to left arm and hand since yesterday. Weakness and shortness of breath with same

## 2016-01-20 NOTE — H&P (Signed)
History and Physical    Phyllis Calhoun YNW:[id number] DOB: Oct 28, 1975 DOA: 01/20/2016   PCP: Farris Has, MD   Patient coming from:  Home   Chief Complaint: Shortness of breath   HPI: Phyllis Calhoun is a 40 y.o. female with medical history significant for morbid obesity, sleep apnea, arthritis, history of trigeminal neuralgia,on the recent left knee arthroscopy on 12/29/2015, presenting today with cute onset of shortness of breath since 4:00 this morning, associated initial daily with paresthesias on the left hand. At the ED, she reported left sided chest pain with breathing. Of note, the patient had been investigated for DVT while in the hospital following her surgery 2 weeks ago, with negative Dopplers. On presentation, the patient denies any palpitations. She denies any hemoptysis. She denies any fevers or chills. No recent infections. She denies any focal numbness or weakness.she continues to report bilateral calf swelling. She feels very weak.  She denies any history of tobacco or alcohol abuse.She denies any new medications.She denies any hormonal supplements. She is not menopausal. She denies any recent long distance trips. She admits to sedentary lifestyle. She denies any cardiac history cardiac family history prior to this event.    ED Course:  BP 112/67 mmHg  Pulse 65  Temp(Src) 99.4 F (37.4 C) (Oral)  Resp 11  Ht 5\' 8"  (1.727 m)  Wt 163.295 kg (360 lb)  BMI 54.75 kg/m2  SpO2 99%  Received ASA, Percocet, Morphine, NTG held due to low blood pressure.  Chest x-ray with no acute abnormalities  Troponins 0.0    EKG with normal sinus rhythm without acute ischemic changes. ER review   Anion gap 5  Glucose 108  White count 7.5   Hemoglobin 10 .8    CT of the chest was inconclusive for PE due to body habitus VQ scan is pending. D-dimer is pending.  Review of Systems: As per HPI otherwise 10 point review of systems negative.   Past Medical History  Diagnosis Date  .  Trigeminal neuralgia     2012-PT CONTINUES TO HAVE PAIN RIGHT SIDE OF FACE  . Gallstones     HX OF SEVERAL GALLBLADDER ATTACKS  . Headache(784.0)     PT STATES MIGRAINES FROM TRIGEMINAL NEURALGIA  . Arthritis     both legs  . Sleep apnea     sleep study confirmed    Past Surgical History  Procedure Laterality Date  . Abdominal hysterectomy    . Breast reduction surgery    . Cholecystectomy N/A 05/25/2013    Procedure: LAPAROSCOPIC CHOLECYSTECTOMY WITH INTRAOPERATIVE CHOLANGIOGRAM;  Surgeon: Axel Filler, MD;  Location: [facility];  Service: General;  Laterality: N/A;  . Tonsillectomy    . Knee arthroscopy Left 12/29/2015    Procedure: ARTHROSCOPY KNEE;  Surgeon: Jodi Geralds, MD;  Location: [facility];  Service: Orthopedics;  Laterality: Left;    Social History Social History   Social History  . Marital Status: Single    Spouse Name: N/A  . Number of Children: N/A  . Years of Education: N/A   Occupational History  . Not on file.   Social History Main Topics  . Smoking status: Never Smoker   . Smokeless tobacco: Never Used  . Alcohol Use: No  . Drug Use: No  . Sexual Activity: Not on file   Other Topics Concern  . Not on file   Social History Narrative     No Known Allergies  No family history on file.    Prior  to Admission medications   Medication Sig Start Date End Date Taking? Authorizing Provider  acetaminophen (TYLENOL) 500 MG tablet Take 1,000 mg by mouth every 6 (six) hours as needed for pain. Pain.   Yes Historical Provider, MD  HYDROcodone-acetaminophen (NORCO/VICODIN) 5-325 MG tablet Take 1 tablet by mouth every 6 (six) hours as needed for moderate pain.   Yes Historical Provider, MD  naproxen (NAPROSYN) 500 MG tablet Take 500 mg by mouth 2 (two) times daily as needed for moderate pain.   Yes Historical Provider, MD  SUMAtriptan (IMITREX) 100 MG tablet Take 100 mg by mouth every 2 (two) hours as needed for migraine or headache. May repeat in 2 hours if  headache persists or recurs.    Historical Provider, MD    Physical Exam:    Filed Vitals:   01/20/16 1430 01/20/16 1500 01/20/16 1600 01/20/16 1630  BP: 124/75 116/68 98/59 112/67  Pulse: 67 71 65 65  Temp:      TempSrc:      Resp: 12 14 15 11   Height:      Weight:      SpO2: 97% 96% 97% 99%       Constitutional: NAD, calm, comfortable, morbidly obese. Filed Vitals:   01/20/16 1430 01/20/16 1500 01/20/16 1600 01/20/16 1630  BP: 124/75 116/68 98/59 112/67  Pulse: 67 71 65 65  Temp:      TempSrc:      Resp: 12 14 15 11   Height:      Weight:      SpO2: 97% 96% 97% 99%   Eyes: PERRL, lids and conjunctivae normal ENMT: Mucous membranes are moist. Posterior pharynx clear of any exudate or lesions.Normal dentition.  Neck: normal, supple, no masses, no thyromegaly Respiratory: clear to auscultation bilaterally, no wheezing, no crackles. Normal respiratory effort. No accessory muscle use.  Cardiovascular: Regular rate and rhythm, no murmurs / rubs / gallops. No extremity edema. 2+ pedal pulses. No carotid bruits.  Abdomen:obese, no tenderness, no masses palpated. No hepatosplenomegaly. Bowel sounds positive.  Musculoskeletal: no clubbing / cyanosis. No joint deformity upper and lower extremities. Good ROM, no contractures. Normal muscle tone.  Skin: no rashes, lesions, ulcers.  Neurologic:  Known Trigeminal neuralgia  CN 2-12 grossly intact.  DTR normal. Strength 5/5 in all 4.  Psychiatric: Normal judgment and insight. Alert and oriented x 3. Normal mood.     Labs on Admission: I have personally reviewed following labs and imaging studies  CBC:  Recent Labs Lab 01/20/16 1020  WBC 7.5  HGB 10.8*  HCT 36.2  MCV 83.2  PLT 303    Basic Metabolic Panel:  Recent Labs Lab 01/20/16 1020  NA 138  K 4.6  CL 108  CO2 25  GLUCOSE 108*  BUN 7  CREATININE 0.80  CALCIUM 8.9    GFR: Estimated Creatinine Clearance: 151.5 mL/min (by C-G formula based on Cr of  0.8).  Liver Function Tests: No results for input(s): AST, ALT, ALKPHOS, BILITOT, PROT, ALBUMIN in the last 168 hours. No results for input(s): LIPASE, AMYLASE in the last 168 hours. No results for input(s): AMMONIA in the last 168 hours.  Coagulation Profile: No results for input(s): INR, PROTIME in the last 168 hours.  Cardiac Enzymes: No results for input(s): CKTOTAL, CKMB, CKMBINDEX, TROPONINI in the last 168 hours.  BNP (last 3 results) No results for input(s): PROBNP in the last 8760 hours.  HbA1C: No results for input(s): HGBA1C in the last 72 hours.  CBG: No  results for input(s): GLUCAP in the last 168 hours.  Lipid Profile: No results for input(s): CHOL, HDL, LDLCALC, TRIG, CHOLHDL, LDLDIRECT in the last 72 hours.  Thyroid Function Tests: No results for input(s): TSH, T4TOTAL, FREET4, T3FREE, THYROIDAB in the last 72 hours.  Anemia Panel: No results for input(s): VITAMINB12, FOLATE, FERRITIN, TIBC, IRON, RETICCTPCT in the last 72 hours.  Urine analysis:    Component Value Date/Time   COLORURINE YELLOW 10/11/2012 0609   APPEARANCEUR CLEAR 10/11/2012 0609   LABSPEC 1.014 10/11/2012 0609   PHURINE 6.0 10/11/2012 0609   GLUCOSEU NEGATIVE 10/11/2012 0609   HGBUR NEGATIVE 10/11/2012 0609   BILIRUBINUR NEGATIVE 10/11/2012 0609   KETONESUR NEGATIVE 10/11/2012 0609   PROTEINUR NEGATIVE 10/11/2012 0609   UROBILINOGEN 0.2 10/11/2012 0609   NITRITE NEGATIVE 10/11/2012 0609   LEUKOCYTESUR NEGATIVE 10/11/2012 0609    Sepsis Labs: Invalid input(s): PROCALCITONIN, LACTICIDVEN )No results found for this or any previous visit (from the past 240 hour(s)).   Radiological Exams on Admission: Dg Chest 2 View  01/20/2016  CLINICAL DATA:  Patient with diffuse chest tightness. EXAM: CHEST  2 VIEW COMPARISON:  Chest CT 08/04/2014; chest radiograph 08/04/2014. FINDINGS: Stable cardiac and mediastinal contours. No consolidative pulmonary opacities. No pleural effusion or  pneumothorax. Regional skeleton is unremarkable. IMPRESSION: No active cardiopulmonary disease. Electronically Signed   By: Annia Beltrew  Davis M.D.   On: 01/20/2016 11:03   Ct Angio Chest Pe W/cm &/or Wo Cm  01/20/2016  CLINICAL DATA:  Patient with history of left knee surgery 3 weeks prior. Left arm tingling. EXAM: CT ANGIOGRAPHY CHEST WITH CONTRAST TECHNIQUE: Multidetector CT imaging of the chest was performed using the standard protocol during bolus administration of intravenous contrast. Multiplanar CT image reconstructions and MIPs were obtained to evaluate the vascular anatomy. CONTRAST:  100 cc Isovue 370 COMPARISON:  Chest radiograph 01/20/2016; chest CT 08/04/2014. FINDINGS: Mediastinum/Nodes: Normal heart size. No pericardial effusion. Aorta and main pulmonary artery normal in caliber. No axillary, mediastinal or hilar lymphadenopathy. Examination for pulmonary embolism is significantly limited secondary to bolus and patient's body habitus. No evidence for main, central or proximal lobar pulmonary emboli. Evaluation of the distal lobar, segmental and subsegmental pulmonary arteries is nearly nondiagnostic due to the above described artifacts. Lungs/Pleura: Central airways are patent. No consolidative or nodular pulmonary opacities. No pleural effusion or pneumothorax. Upper abdomen: Unremarkable Musculoskeletal: No aggressive or acute appearing osseous lesions. Thoracic spine degenerative changes. Review of the MIP images confirms the above findings. IMPRESSION: Limited examination for pulmonary emboli given bolus technique and patient body habitus. No evidence for central, main or proximal lobar pulmonary emboli. Evaluation of the distal lobar, segmental and subsegmental pulmonary arteries nearly nondiagnostic secondary to above mentioned artifacts. If there is persistent clinical concern, consider correlation with V/Q study or lower extremity Dopplers. Electronically Signed   By: Annia Beltrew  Davis M.D.   On:  01/20/2016 12:49    EKG: Independently reviewed.  Assessment/Plan Active Problems:   Morbid obesity (HCC)   Chest pain   Trigeminal neuralgia    Acute onset of Shortness of Breath with Left chest pain  without respiratory failure, exertional  with O2 sats in the 90s on RA.  CTA inclonclusive for PE . Bilateral  LE US negative for DVT.  Tn 0. 0 EKG without ACS. VQ scan and DDMER  pending . Risk factors include morbid obesity, sedentary, recent surgery. Was not on ASA prior to admission   BP 112/67. Tmax 99.4 . WBC normal  O2 as  needed PT/INR  Lovenox   for now, will   Involve  Pharmacy if  PE is found  2 D Echo EKG in am  Lactic acid Serial troponins   History of Migraines Continue Imitrex prn    DVT prophylaxis: Lovenox as above  Code Status:   Full    Family Communication:  Discussed with  husband  Disposition Plan: Expect patient to be discharged to home after condition improves Consults called:    None Admission status:Tele  Obs     Porfiria Heinrich E, PA-C [facility]   If 7PM-7AM, please contact night-coverage [email or web address] Password [facility]  01/20/2016, 4:57 PM

## 2016-01-20 NOTE — ED Provider Notes (Signed)
CSN: [id number]     Arrival date & time 01/20/16  4098 History   First MD Initiated Contact with Patient 01/20/16 1008     Chief Complaint  Patient presents with  . Chest Pain     (Consider location/radiation/quality/duration/timing/severity/associated sxs/prior Treatment) The history is provided by the patient and medical records.    40 y.o. F with hx of trigeminal neuralgia, sleep apnea, arthritis, presenting to the ED for chest tightness.  Patient reports this began around 0400 this morning and has remained persistent since that time.  She reports associated paresthesias of the left hand. She states she felt it may be due to the way she was lying in bed as she was lying on her left side. She states she laid on to her back but continues to have some paresthesias of the left hand. She denies any focal numbness or weakness. Patient has no known cardiac history. She denies any family cardiac history. She has never been a smoker. Patient does report she had knee surgery on 12/29/2015 for a meniscal tear. This was repaired by Dr. Luiz Blare. She states shortly after she did develop some left calf swelling and had a venous duplex in the ED about 2 weeks ago that was negative for DVT.  Patient states she overall does feel weak and short of breath. This has been worse since her chest pain began.  Past Medical History  Diagnosis Date  . Trigeminal neuralgia     2012-PT CONTINUES TO HAVE PAIN RIGHT SIDE OF FACE  . Gallstones     HX OF SEVERAL GALLBLADDER ATTACKS  . Headache(784.0)     PT STATES MIGRAINES FROM TRIGEMINAL NEURALGIA  . Arthritis     both legs  . Sleep apnea     sleep study confirmed   Past Surgical History  Procedure Laterality Date  . Abdominal hysterectomy    . Breast reduction surgery    . Cholecystectomy N/A 05/25/2013    Procedure: LAPAROSCOPIC CHOLECYSTECTOMY WITH INTRAOPERATIVE CHOLANGIOGRAM;  Surgeon: Axel Filler, MD;  Location: [facility];  Service: General;  Laterality:  N/A;  . Tonsillectomy    . Knee arthroscopy Left 12/29/2015    Procedure: ARTHROSCOPY KNEE;  Surgeon: Jodi Geralds, MD;  Location: [facility];  Service: Orthopedics;  Laterality: Left;   No family history on file. Social History  Substance Use Topics  . Smoking status: Never Smoker   . Smokeless tobacco: Never Used  . Alcohol Use: No   OB History    No data available     Review of Systems  Respiratory: Positive for chest tightness and shortness of breath.   Neurological: Positive for weakness.  All other systems reviewed and are negative.     Allergies  Review of patient's allergies indicates no known allergies.  Home Medications   Prior to Admission medications   Medication Sig Start Date End Date Taking? Authorizing Provider  acetaminophen (TYLENOL) 500 MG tablet Take 1,000 mg by mouth every 6 (six) hours as needed for pain. Pain.    Historical Provider, MD  naproxen (NAPROSYN) 500 MG tablet Take 500 mg by mouth 2 (two) times daily as needed for moderate pain.    Historical Provider, MD  oxyCODONE-acetaminophen (PERCOCET/ROXICET) 5-325 MG tablet Take 1-2 tablets by mouth every 4 (four) hours as needed for severe pain. 12/29/15   Marshia Ly, PA-C  SUMAtriptan (IMITREX) 100 MG tablet Take 100 mg by mouth every 2 (two) hours as needed for migraine or headache. May repeat in 2 hours  if headache persists or recurs.    Historical Provider, MD   BP 153/73 mmHg  Pulse 78  Temp(Src) 99.4 F (37.4 C) (Oral)  Resp 18  Ht 5\' 8"  (1.727 m)  Wt 163.295 kg  BMI 54.75 kg/m2  SpO2 97%   Physical Exam  Constitutional: She is oriented to person, place, and time. She appears well-developed and well-nourished.  Morbidly obese, no distress  HENT:  Head: Normocephalic and atraumatic.  Mouth/Throat: Oropharynx is clear and moist.  Eyes: Conjunctivae and EOM are normal. Pupils are equal, round, and reactive to light.  Neck: Normal range of motion.  Cardiovascular: Normal rate, regular rhythm  and normal heart sounds.   Pulmonary/Chest: Effort normal and breath sounds normal. No respiratory distress. She has no wheezes. She has no rhonchi. She has no rales.  Lungs clear without wheezes or rhonchi, no rales  Abdominal: Soft. Bowel sounds are normal.  Musculoskeletal: Normal range of motion. She exhibits no edema.  Neurological: She is alert and oriented to person, place, and time.  Skin: Skin is warm and dry.  Psychiatric: She has a normal mood and affect.  Nursing note and vitals reviewed.   ED Course  Procedures (including critical care time)  Labs Review Labs Reviewed  BASIC METABOLIC PANEL - Abnormal; Notable for the following:    Glucose, Bld 108 (*)    All other components within normal limits  CBC - Abnormal; Notable for the following:    Hemoglobin 10.8 (*)    MCH 24.8 (*)    MCHC 29.8 (*)    RDW 16.3 (*)    All other components within normal limits  I-STAT TROPOININ, ED  Rosezena SensorI-STAT TROPOININ, ED    Imaging Review Dg Chest 2 View  01/20/2016  CLINICAL DATA:  Patient with diffuse chest tightness. EXAM: CHEST  2 VIEW COMPARISON:  Chest CT 08/04/2014; chest radiograph 08/04/2014. FINDINGS: Stable cardiac and mediastinal contours. No consolidative pulmonary opacities. No pleural effusion or pneumothorax. Regional skeleton is unremarkable. IMPRESSION: No active cardiopulmonary disease. Electronically Signed   By: Annia Beltrew  Davis M.D.   On: 01/20/2016 11:03   Ct Angio Chest Pe W/cm &/or Wo Cm  01/20/2016  CLINICAL DATA:  Patient with history of left knee surgery 3 weeks prior. Left arm tingling. EXAM: CT ANGIOGRAPHY CHEST WITH CONTRAST TECHNIQUE: Multidetector CT imaging of the chest was performed using the standard protocol during bolus administration of intravenous contrast. Multiplanar CT image reconstructions and MIPs were obtained to evaluate the vascular anatomy. CONTRAST:  100 cc Isovue 370 COMPARISON:  Chest radiograph 01/20/2016; chest CT 08/04/2014. FINDINGS:  Mediastinum/Nodes: Normal heart size. No pericardial effusion. Aorta and main pulmonary artery normal in caliber. No axillary, mediastinal or hilar lymphadenopathy. Examination for pulmonary embolism is significantly limited secondary to bolus and patient's body habitus. No evidence for main, central or proximal lobar pulmonary emboli. Evaluation of the distal lobar, segmental and subsegmental pulmonary arteries is nearly nondiagnostic due to the above described artifacts. Lungs/Pleura: Central airways are patent. No consolidative or nodular pulmonary opacities. No pleural effusion or pneumothorax. Upper abdomen: Unremarkable Musculoskeletal: No aggressive or acute appearing osseous lesions. Thoracic spine degenerative changes. Review of the MIP images confirms the above findings. IMPRESSION: Limited examination for pulmonary emboli given bolus technique and patient body habitus. No evidence for central, main or proximal lobar pulmonary emboli. Evaluation of the distal lobar, segmental and subsegmental pulmonary arteries nearly nondiagnostic secondary to above mentioned artifacts. If there is persistent clinical concern, consider correlation with V/Q study or  lower extremity Dopplers. Electronically Signed   By: Annia Belt M.D.   On: 01/20/2016 12:49   I have personally reviewed and evaluated these images and lab results as part of my medical decision-making.   EKG Interpretation None      MDM   Final diagnoses:  Chest pain, unspecified chest pain type   40 year old female here with chest tightness, left arm pain and tingling since 4 AM this morning. She reports generalized weakness and shortness of breath as well. Patient did have recent knee surgery on 12/29/2015 for a meniscal tear. She was evaluated in the ED about 11 days ago and had a vascular ultrasound which was negative for acute DVT.  EKG NSR without acute ischemic changes.  Initial and delta trop negative.  Remainder of labs reassuring.   CXR clear.  Given her recent knee surgery, CT a of chest was obtained, no evidence of central, main, or proximal lobar pulmonary emboli, however remainder of exam nondiagnostic due to patient's body habitus. She has continued having pain here in the emergency department-- no improvement with aspirin, Percocet, or morphine.  Her BP is soft at 110/61 so will hold NTG at this time.    Case discussed with hospitalist service-- recommended to add d-dimer, obtain VQ scan.  Will admit for tele obs.  Temp admit orders placed.  VSS.  Garlon Hatchet, PA-C 01/20/16 1609  Gerhard Munch, MD 01/20/16 [phone]

## 2016-01-21 ENCOUNTER — Observation Stay (HOSPITAL_COMMUNITY): Payer: 59

## 2016-01-21 ENCOUNTER — Observation Stay (HOSPITAL_BASED_OUTPATIENT_CLINIC_OR_DEPARTMENT_OTHER): Payer: 59

## 2016-01-21 DIAGNOSIS — R0789 Other chest pain: Principal | ICD-10-CM

## 2016-01-21 DIAGNOSIS — R079 Chest pain, unspecified: Secondary | ICD-10-CM

## 2016-01-21 DIAGNOSIS — R0602 Shortness of breath: Secondary | ICD-10-CM

## 2016-01-21 LAB — ECHOCARDIOGRAM COMPLETE
CHL CUP MV DEC (S): 208
CHL CUP TV REG PEAK VELOCITY: 239 cm/s
E decel time: 208 msec
E/e' ratio: 12.97
FS: 32 % (ref 28–44)
Height: 68 in
IV/PV OW: 0.86
LA diam end sys: 38 mm
LADIAMINDEX: 1.17 cm/m2
LASIZE: 38 mm
LAVOL: 59.9 mL
LAVOLA4C: 48.7 mL
LAVOLIN: 18.4 mL/m2
LDCA: 3.14 cm2
LV E/e'average: 12.97
LV PW d: 11.6 mm — AB (ref 0.6–1.1)
LVEEMED: 12.97
LVELAT: 7.94 cm/s
LVOT diameter: 20 mm
Lateral S' vel: 13.1 cm/s
MV Peak grad: 4 mmHg
MV pk A vel: 104 m/s
MVPKEVEL: 103 m/s
RV sys press: 26 mmHg
TDI e' lateral: 7.94
TDI e' medial: 9.79
TRMAXVEL: 239 cm/s
Weight: 7172.8 oz

## 2016-01-21 LAB — CBC
HEMATOCRIT: 35.1 % — AB (ref 36.0–46.0)
HEMOGLOBIN: 10.6 g/dL — AB (ref 12.0–15.0)
MCH: 25.1 pg — ABNORMAL LOW (ref 26.0–34.0)
MCHC: 30.2 g/dL (ref 30.0–36.0)
MCV: 83 fL (ref 78.0–100.0)
Platelets: 297 10*3/uL (ref 150–400)
RBC: 4.23 MIL/uL (ref 3.87–5.11)
RDW: 16.5 % — ABNORMAL HIGH (ref 11.5–15.5)
WBC: 6.9 10*3/uL (ref 4.0–10.5)

## 2016-01-21 LAB — COMPREHENSIVE METABOLIC PANEL
ALBUMIN: 3 g/dL — AB (ref 3.5–5.0)
ALT: 9 U/L — ABNORMAL LOW (ref 14–54)
ANION GAP: 8 (ref 5–15)
AST: 18 U/L (ref 15–41)
Alkaline Phosphatase: 77 U/L (ref 38–126)
BILIRUBIN TOTAL: 0.4 mg/dL (ref 0.3–1.2)
BUN: 5 mg/dL — ABNORMAL LOW (ref 6–20)
CHLORIDE: 106 mmol/L (ref 101–111)
CO2: 25 mmol/L (ref 22–32)
Calcium: 8.9 mg/dL (ref 8.9–10.3)
Creatinine, Ser: 0.72 mg/dL (ref 0.44–1.00)
GFR calc Af Amer: >60 mL/min (ref >60)
Glucose, Bld: 94 mg/dL (ref 65–99)
POTASSIUM: 3.6 mmol/L (ref 3.5–5.1)
Sodium: 139 mmol/L (ref 135–145)
TOTAL PROTEIN: 6.2 g/dL — AB (ref 6.5–8.1)

## 2016-01-21 LAB — PROTIME-INR
INR: 1.16 (ref 0.00–1.49)
Prothrombin Time: 15 seconds (ref 11.6–15.2)

## 2016-01-21 MED ORDER — TECHNETIUM TC 99M DIETHYLENETRIAME-PENTAACETIC ACID: 32.7000 | Freq: Once | INTRAVENOUS | Status: DC | PRN

## 2016-01-21 MED ORDER — TECHNETIUM TO 99M ALBUMIN AGGREGATED
4.1800 | Freq: Once | INTRAVENOUS | Status: AC | PRN
  Administered 2016-01-21: 4 via INTRAVENOUS

## 2016-01-21 NOTE — Consult Note (Signed)
CARDIOLOGY CONSULT NOTE       Patient ID: Phyllis Calhoun MRN: [id number] DOB/AGE: 1976/05/21 40 y.o.  Admit date: 01/20/2016 Referring Physician: Thedore MinsSingh  Primary Physician: Farris HasMORROW, AARON, MD Primary Cardiologist: New  Reason for Consultation: Chest Pain   Active Problems:   Morbid obesity (HCC)   Chest pain   Trigeminal neuralgia   Shortness of breath   HPI:  40 y.o. female with medical history significant for morbid obesity, sleep apnea, arthritis, history of trigeminal neuralgia,on the recent left knee arthroscopy on 12/29/2015, presenting 6/17 with acute onset of shortness of breath since 4:00 this morning, associated initial daily with paresthesias on the left hand. At the ED, she reported left sided chest pain with breathing. Of note, the patient had been investigated for DVT while in the hospital following her surgery 2 weeks ago, with negative Dopplers. On presentation, the patient denies any palpitations. She denies any hemoptysis. She denies any fevers or chills. No recent infections. She denies any focal numbness or weakness.she continues to report bilateral calf swelling. She feels very weak.  She denies any history of tobacco or alcohol abuse.She denies any new medications.She denies any hormonal supplements. She is not menopausal. She denies any recent long distance trips. She admits to sedentary lifestyle. She denies any cardiac history cardiac family history prior to this event.  Pain in chest "tightness" still some this am no pleuritic or positional component nitro not real helpful  So far enzymes negative ECG normal  Echo pending   ROS All other systems reviewed and negative except as noted above  Past Medical History  Diagnosis Date  . Trigeminal neuralgia     2012-PT CONTINUES TO HAVE PAIN RIGHT SIDE OF FACE  . Gallstones     HX OF SEVERAL GALLBLADDER ATTACKS  . Headache(784.0)     PT STATES MIGRAINES FROM TRIGEMINAL NEURALGIA  . Arthritis     both  legs  . Sleep apnea     sleep study confirmed    Family History No premature CAD  Father with HTN    Social;  Not working Has two children not married but boyfriend in room with her. No drugs or smoking Sedentary   Past Surgical History  Procedure Laterality Date  . Abdominal hysterectomy    . Breast reduction surgery    . Cholecystectomy N/A 05/25/2013    Procedure: LAPAROSCOPIC CHOLECYSTECTOMY WITH INTRAOPERATIVE CHOLANGIOGRAM;  Surgeon: Axel FillerArmando Ramirez, MD;  Location: [facility];  Service: General;  Laterality: N/A;  . Tonsillectomy    . Knee arthroscopy Left 12/29/2015    Procedure: ARTHROSCOPY KNEE;  Surgeon: Jodi GeraldsJohn Graves, MD;  Location: [facility];  Service: Orthopedics;  Laterality: Left;     . aspirin EC  81 mg Oral Daily  . enoxaparin (LOVENOX) injection  40 mg Subcutaneous Q24H  . sodium chloride flush  3 mL Intravenous Q12H   . sodium chloride 75 mL/hr (01/20/16 2018)    Physical Exam: Blood pressure 121/72, pulse 145, temperature 98.5 F (36.9 C), temperature source Oral, resp. rate 16, height 5\' 8"  (1.727 m), weight 448 lb 4.8 oz (203.348 kg), SpO2 96 %.   Affect appropriate Morbidly obese black female  HEENT: normal Neck supple with no adenopathy JVP normal no bruits no thyromegaly Lungs clear with no wheezing and good diaphragmatic motion Heart:  S1/S2 no murmur, no rub, gallop or click PMI normal Abdomen: benighn, BS positve, no tenderness, no AAA no bruit.  No HSM or HJR Distal pulses intact with no bruits  Plus one bilateral  edema Neuro non-focal Skin warm and dry No muscular weakness   Labs:   Lab Results  Component Value Date   WBC 6.9 01/21/2016   HGB 10.6* 01/21/2016   HCT 35.1* 01/21/2016   MCV 83.0 01/21/2016   PLT 297 01/21/2016    Recent Labs Lab 01/21/16 0241  NA 139  K 3.6  CL 106  CO2 25  BUN 5*  CREATININE 0.72  CALCIUM 8.9  PROT 6.2*  BILITOT 0.4  ALKPHOS 77  ALT 9*  AST 18  GLUCOSE 94   Lab Results  Component Value Date    TROPONINI <0.03 01/20/2016     Radiology: Dg Chest 2 View  01/20/2016  CLINICAL DATA:  Patient with diffuse chest tightness. EXAM: CHEST  2 VIEW COMPARISON:  Chest CT 08/04/2014; chest radiograph 08/04/2014. FINDINGS: Stable cardiac and mediastinal contours. No consolidative pulmonary opacities. No pleural effusion or pneumothorax. Regional skeleton is unremarkable. IMPRESSION: No active cardiopulmonary disease. Electronically Signed   By: Annia Belt M.D.   On: 01/20/2016 11:03   Ct Angio Chest Pe W/cm &/or Wo Cm  01/20/2016  CLINICAL DATA:  Patient with history of left knee surgery 3 weeks prior. Left arm tingling. EXAM: CT ANGIOGRAPHY CHEST WITH CONTRAST TECHNIQUE: Multidetector CT imaging of the chest was performed using the standard protocol during bolus administration of intravenous contrast. Multiplanar CT image reconstructions and MIPs were obtained to evaluate the vascular anatomy. CONTRAST:  100 cc Isovue 370 COMPARISON:  Chest radiograph 01/20/2016; chest CT 08/04/2014. FINDINGS: Mediastinum/Nodes: Normal heart size. No pericardial effusion. Aorta and main pulmonary artery normal in caliber. No axillary, mediastinal or hilar lymphadenopathy. Examination for pulmonary embolism is significantly limited secondary to bolus and patient's body habitus. No evidence for main, central or proximal lobar pulmonary emboli. Evaluation of the distal lobar, segmental and subsegmental pulmonary arteries is nearly nondiagnostic due to the above described artifacts. Lungs/Pleura: Central airways are patent. No consolidative or nodular pulmonary opacities. No pleural effusion or pneumothorax. Upper abdomen: Unremarkable Musculoskeletal: No aggressive or acute appearing osseous lesions. Thoracic spine degenerative changes. Review of the MIP images confirms the above findings. IMPRESSION: Limited examination for pulmonary emboli given bolus technique and patient body habitus. No evidence for central, main or proximal  lobar pulmonary emboli. Evaluation of the distal lobar, segmental and subsegmental pulmonary arteries nearly nondiagnostic secondary to above mentioned artifacts. If there is persistent clinical concern, consider correlation with V/Q study or lower extremity Dopplers. Electronically Signed   By: Annia Belt M.D.   On: 01/20/2016 12:49    EKG:  NSR rate 83 normal    ASSESSMENT AND PLAN:  Chest Pain: atypical negative enzymes ECG normal Non invasive testing is not reliable in patients this size. Unable to walk on treadmill due to obesity and recent knee surgery If echo with normal LV function would Rx with NSAI's and GI meds d/c home with primary care outpatient f/u.  Can send home with SL nitro. If pain is recurrent requiring hospitalization would just cath radially as this would be more reliable Review of her CTA done for PE shows normal origin of her cors and no coronary calcium which is reassuring  Dyspnea:  Appears functions obesity OSA. CT negative for PE although degraded study.  Lung exam and sats normal  Ortho:  F/U Dr Luiz Blare post left arthroscopic knee surgery no evidence of post op DVT   Signed: Charlton Haws 01/21/2016, 10:48 AM

## 2016-01-21 NOTE — Progress Notes (Signed)
  Echocardiogram 2D Echocardiogram has been performed.  Delcie RochENNINGTON, Harneet Noblett 01/21/2016, 10:42 AM

## 2016-01-21 NOTE — Discharge Instructions (Signed)
Follow with Primary MD Farris HasMORROW, AARON, MD in 7 days   Get CBC, CMP, 2 view Chest X ray checked  by Primary MD next visit.    Activity: As tolerated with Full fall precautions use walker/cane & assistance as needed   Disposition Home     Diet:   Heart Healthy    For Heart failure patients - Check your Weight same time everyday, if you gain over 2 pounds, or you develop in leg swelling, experience more shortness of breath or chest pain, call your Primary MD immediately. Follow Cardiac Low Salt Diet and 1.5 lit/day fluid restriction.   On your next visit with your primary care physician please Get Medicines reviewed and adjusted.   Please request your Prim.MD to go over all Hospital Tests and Procedure/Radiological results at the follow up, please get all Hospital records sent to your Prim MD by signing hospital release before you go home.   If you experience worsening of your admission symptoms, develop shortness of breath, life threatening emergency, suicidal or homicidal thoughts you must seek medical attention immediately by calling 911 or calling your MD immediately  if symptoms less severe.  You Must read complete instructions/literature along with all the possible adverse reactions/side effects for all the Medicines you take and that have been prescribed to you. Take any new Medicines after you have completely understood and accpet all the possible adverse reactions/side effects.   Do not drive, operate heavy machinery, perform activities at heights, swimming or participation in water activities or provide baby sitting services if your were admitted for syncope or siezures until you have seen by Primary MD or a Neurologist and advised to do so again.  Do not drive when taking Pain medications.    Do not take more than prescribed Pain, Sleep and Anxiety Medications  Special Instructions: If you have smoked or chewed Tobacco  in the last 2 yrs please stop smoking, stop any regular  Alcohol  and or any Recreational drug use.  Wear Seat belts while driving.   Please note  You were cared for by a hospitalist during your hospital stay. If you have any questions about your discharge medications or the care you received while you were in the hospital after you are discharged, you can call the unit and asked to speak with the hospitalist on call if the hospitalist that took care of you is not available. Once you are discharged, your primary care physician will handle any further medical issues. Please note that NO REFILLS for any discharge medications will be authorized once you are discharged, as it is imperative that you return to your primary care physician (or establish a relationship with a primary care physician if you do not have one) for your aftercare needs so that they can reassess your need for medications and monitor your lab values.

## 2016-01-21 NOTE — Progress Notes (Signed)
*  PRELIMINARY RESULTS* Vascular Ultrasound Lower extremity venous duplex has been completed.  Preliminary findings: Technically limited due to body habitus. Very poor visualization of veins, however no obvious DVT is noted.    Farrel DemarkJill Eunice, RDMS, RVT  01/21/2016, 9:58 AM

## 2016-01-21 NOTE — Discharge Summary (Signed)
Phyllis Calhoun, is a 40 y.o. female  DOB [birth year]  MRN [id number].  Admission date:  01/20/2016  Admitting Physician  Pete Glatter, MD  Discharge Date:  01/21/2016   Primary MD  Farris Has, MD  Recommendations for primary care physician for things to follow:    Check CBC, BMP in a week   Admission Diagnosis  Chest pain [R07.9] Chest pain, unspecified chest pain type [R07.9]   Discharge Diagnosis  Chest pain [R07.9] Chest pain, unspecified chest pain type [R07.9]     Active Problems:   Morbid obesity (HCC)   Chest pain   Trigeminal neuralgia   Shortness of breath      Past Medical History  Diagnosis Date  . Trigeminal neuralgia     2012-PT CONTINUES TO HAVE PAIN RIGHT SIDE OF FACE  . Gallstones     HX OF SEVERAL GALLBLADDER ATTACKS  . Headache(784.0)     PT STATES MIGRAINES FROM TRIGEMINAL NEURALGIA  . Arthritis     both legs  . Sleep apnea     sleep study confirmed    Past Surgical History  Procedure Laterality Date  . Abdominal hysterectomy    . Breast reduction surgery    . Cholecystectomy N/A 05/25/2013    Procedure: LAPAROSCOPIC CHOLECYSTECTOMY WITH INTRAOPERATIVE CHOLANGIOGRAM;  Surgeon: Axel Filler, MD;  Location: [facility];  Service: General;  Laterality: N/A;  . Tonsillectomy    . Knee arthroscopy Left 12/29/2015    Procedure: ARTHROSCOPY KNEE;  Surgeon: Jodi Geralds, MD;  Location: [facility];  Service: Orthopedics;  Laterality: Left;       HPI  from the history and physical done on the day of admission:   Phyllis Calhoun is a 40 y.o. female with medical history significant for morbid obesity, sleep apnea, arthritis, history of trigeminal neuralgia,on the recent left knee arthroscopy on 12/29/2015, presenting today with cute onset of shortness of breath since 4:00 this  morning, associated initial daily with paresthesias on the left hand. At the ED, she reported left sided chest pain with breathing. Of note, the patient had been investigated for DVT while in the hospital following her surgery 2 weeks ago, with negative Dopplers. On presentation, the patient denies any palpitations. She denies any hemoptysis. She denies any fevers or chills. No recent infections. She denies any focal numbness or weakness.she continues to report bilateral calf swelling. She feels very weak.   She denies any history of tobacco or alcohol abuse.She denies any new medications.She denies any hormonal supplements. She is not menopausal. She denies any recent long distance trips. She admits to sedentary lifestyle. She denies any cardiac history cardiac family history prior to this event.    Hospital Course:   1.  Shortness of breath with atypical chest pain in a patient with morbid obesity and recent left knee surgery. D-dimer was borderline elevated, she underwent CT angiogram which was unremarkable but limited test due to body habitus, TTE stable, venous duplex lower extremity unremarkable, VQ scan -ve, her symptoms are  much better and she is eager to go home, SOB most likely due to morbid obesity.  2. Recent left knee arthroscopy. Follow with Dr. Luiz Blare  3. Morbid obesity. Follow with PCP for weight loss.   Follow UP  Follow-up Information    Follow up with Farris Has, MD. Schedule an appointment as soon as possible for a visit in 1 week.   Specialty:  Family Medicine   Contact information:   [address] [place] [address] [phone]       Follow up with GRAVES,JOHN L, MD. Schedule an appointment as soon as possible for a visit in 1 week.   Specialty:  Orthopedic Surgery   Contact information:   [address] [place] [address] [phone]       Follow up with Charlton Haws, MD. Schedule an appointment as soon as possible for a visit in 1  month.   Specialty:  Cardiology   Contact information:   [address] [place] [address] [phone]        Consults obtained - Cards  Discharge Condition: Stable  Diet and Activity recommendation: See Discharge Instructions below  Discharge Instructions           Discharge Instructions    Diet - low sodium heart healthy    Complete by:  As directed      Discharge instructions    Complete by:  As directed   Follow with Primary MD Farris Has, MD in 7 days   Get CBC, CMP, 2 view Chest X ray checked  by Primary MD next visit.    Activity: As tolerated with Full fall precautions use walker/cane & assistance as needed   Disposition Home     Diet:   Heart Healthy    For Heart failure patients - Check your Weight same time everyday, if you gain over 2 pounds, or you develop in leg swelling, experience more shortness of breath or chest pain, call your Primary MD immediately. Follow Cardiac Low Salt Diet and 1.5 lit/day fluid restriction.   On your next visit with your primary care physician please Get Medicines reviewed and adjusted.   Please request your Prim.MD to go over all Hospital Tests and Procedure/Radiological results at the follow up, please get all Hospital records sent to your Prim MD by signing hospital release before you go home.   If you experience worsening of your admission symptoms, develop shortness of breath, life threatening emergency, suicidal or homicidal thoughts you must seek medical attention immediately by calling 911 or calling your MD immediately  if symptoms less severe.  You Must read complete instructions/literature along with all the possible adverse reactions/side effects for all the Medicines you take and that have been prescribed to you. Take any new Medicines after you have completely understood and accpet all the possible adverse reactions/side effects.   Do not drive, operate heavy machinery, perform activities  at heights, swimming or participation in water activities or provide baby sitting services if your were admitted for syncope or siezures until you have seen by Primary MD or a Neurologist and advised to do so again.  Do not drive when taking Pain medications.    Do not take more than prescribed Pain, Sleep and Anxiety Medications  Special Instructions: If you have smoked or chewed Tobacco  in the last 2 yrs please stop smoking, stop any regular Alcohol  and or any Recreational drug use.  Wear Seat belts while driving.  Please note  You were cared for by a hospitalist during your hospital stay. If you have any questions about your discharge medications or the care you received while you were in the hospital after you are discharged, you can call the unit and asked to speak with the hospitalist on call if the hospitalist that took care of you is not available. Once you are discharged, your primary care physician will handle any further medical issues. Please note that NO REFILLS for any discharge medications will be authorized once you are discharged, as it is imperative that you return to your primary care physician (or establish a relationship with a primary care physician if you do not have one) for your aftercare needs so that they can reassess your need for medications and monitor your lab values.     Increase activity slowly    Complete by:  As directed              Discharge Medications       Medication List    TAKE these medications        acetaminophen 500 MG tablet  Commonly known as:  TYLENOL  Take 1,000 mg by mouth every 6 (six) hours as needed for pain. Pain.     HYDROcodone-acetaminophen 5-325 MG tablet  Commonly known as:  NORCO/VICODIN  Take 1 tablet by mouth every 6 (six) hours as needed for moderate pain.     naproxen 500 MG tablet  Commonly known as:  NAPROSYN  Take 500 mg by mouth 2 (two) times daily as needed for moderate pain.     SUMAtriptan 100 MG  tablet  Commonly known as:  IMITREX  Take 100 mg by mouth every 2 (two) hours as needed for migraine or headache. May repeat in 2 hours if headache persists or recurs.        Major procedures and Radiology Reports - PLEASE review detailed and final reports for all details, in brief -   TTE   Left ventricle: The cavity size was normal. Systolic function was normal. The estimated ejection fraction was in the range of 55% to 60%. Wall motion was normal; there were no regional wall motion abnormalities. Left ventricular diastolic function parameters were normal. - Mitral valve: There was mild regurgitation. - Left atrium: The atrium was mildly dilated. - Atrial septum: No defect or patent foramen ovale was identified.  Vascular Ultrasound Lower extremity venous duplex has been completed. Preliminary findings: Technically limited due to body habitus. Very poor visualization of veins, however no obvious DVT is noted.     Dg Chest 2 View  01/20/2016  CLINICAL DATA:  Patient with diffuse chest tightness. EXAM: CHEST  2 VIEW COMPARISON:  Chest CT 08/04/2014; chest radiograph 08/04/2014. FINDINGS: Stable cardiac and mediastinal contours. No consolidative pulmonary opacities. No pleural effusion or pneumothorax. Regional skeleton is unremarkable. IMPRESSION: No active cardiopulmonary disease. Electronically Signed   By: Annia Belt M.D.   On: 01/20/2016 11:03   Ct Angio Chest Pe W/cm &/or Wo Cm  01/20/2016  CLINICAL DATA:  Patient with history of left knee surgery 3 weeks prior. Left arm tingling. EXAM: CT ANGIOGRAPHY CHEST WITH CONTRAST TECHNIQUE: Multidetector CT imaging of the chest was performed using the standard protocol during bolus administration of intravenous contrast. Multiplanar CT image reconstructions and MIPs were obtained to evaluate the vascular anatomy. CONTRAST:  100 cc Isovue 370 COMPARISON:  Chest radiograph 01/20/2016; chest CT 08/04/2014. FINDINGS: Mediastinum/Nodes: Normal  heart size. No pericardial effusion. Aorta  and main pulmonary artery normal in caliber. No axillary, mediastinal or hilar lymphadenopathy. Examination for pulmonary embolism is significantly limited secondary to bolus and patient's body habitus. No evidence for main, central or proximal lobar pulmonary emboli. Evaluation of the distal lobar, segmental and subsegmental pulmonary arteries is nearly nondiagnostic due to the above described artifacts. Lungs/Pleura: Central airways are patent. No consolidative or nodular pulmonary opacities. No pleural effusion or pneumothorax. Upper abdomen: Unremarkable Musculoskeletal: No aggressive or acute appearing osseous lesions. Thoracic spine degenerative changes. Review of the MIP images confirms the above findings. IMPRESSION: Limited examination for pulmonary emboli given bolus technique and patient body habitus. No evidence for central, main or proximal lobar pulmonary emboli. Evaluation of the distal lobar, segmental and subsegmental pulmonary arteries nearly nondiagnostic secondary to above mentioned artifacts. If there is persistent clinical concern, consider correlation with V/Q study or lower extremity Dopplers. Electronically Signed   By: Annia Beltrew  Davis M.D.   On: 01/20/2016 12:49   Nm Pulmonary Perf And Vent  01/21/2016  CLINICAL DATA:  Chest pain with left upper extremity tingling 2 days. Left leg surgery 12/29/2015. EXAM: NUCLEAR MEDICINE VENTILATION - PERFUSION LUNG SCAN TECHNIQUE: Ventilation images were obtained in multiple projections using inhaled aerosol Tc-8334m DTPA. Perfusion images were obtained in multiple projections after intravenous injection of Tc-5634m MAA. RADIOPHARMACEUTICALS:  32.7 mCi Technetium-1834m DTPA aerosol inhalation and 4.06 mCi Technetium-4534m MAA IV COMPARISON:  Chest CTs 01/20/2016 and chest x-ray 01/20/2016 FINDINGS: Ventilation: No focal ventilation defect. Perfusion: No wedge shaped peripheral perfusion defects to suggest acute pulmonary  embolism. IMPRESSION: Normal ventilation perfusion lung scan without evidence of pulmonary embolism. Electronically Signed   By: Elberta Fortisaniel  Boyle M.D.   On: 01/21/2016 14:03    Micro Results      No results found for this or any previous visit (from the past 240 hour(s)).   Today   Subjective    Theodoro DoingFelicia Webster today has no headache,no chest abdominal pain,no new weakness tingling or numbness, feels much better wants to go home today.     Objective   Blood pressure 121/72, pulse 70, temperature 98.5 F (36.9 C), temperature source Oral, resp. rate 16, height 5\' 8"  (1.727 m), weight 203.348 kg (448 lb 4.8 oz), SpO2 96 %.   Intake/Output Summary (Last 24 hours) at 01/21/16 1529 Last data filed at 01/21/16 1416  Gross per 24 hour  Intake    583 ml  Output      0 ml  Net    583 ml    Exam Awake Alert, Oriented x 3, No new F.N deficits, Normal affect [place].AT,PERRAL Supple Neck,No JVD, No cervical lymphadenopathy appriciated.  Symmetrical Chest wall movement, Good air movement bilaterally, CTAB RRR,No Gallops,Rubs or new Murmurs, No Parasternal Heave +ve B.Sounds, Abd Soft, Non tender, No organomegaly appriciated, No rebound -guarding or rigidity. No Cyanosis, Clubbing or edema, No new Rash or bruise   Data Review   CBC w Diff:  Lab Results  Component Value Date   WBC 6.9 01/21/2016   WBC 12.6* 09/04/2012   HGB 10.6* 01/21/2016   HGB 12.2 09/04/2012   HCT 35.1* 01/21/2016   HCT 39.8 09/04/2012   PLT 297 01/21/2016   LYMPHOPCT 39 10/11/2012   MONOPCT 5 10/11/2012   EOSPCT 3 10/11/2012   BASOPCT 1 10/11/2012    CMP:  Lab Results  Component Value Date   NA 139 01/21/2016   K 3.6 01/21/2016   CL 106 01/21/2016   CO2 25 01/21/2016   BUN 5*  01/21/2016   CREATININE 0.72 01/21/2016   PROT 6.2* 01/21/2016   ALBUMIN 3.0* 01/21/2016   BILITOT 0.4 01/21/2016   ALKPHOS 77 01/21/2016   AST 18 01/21/2016   ALT 9* 01/21/2016  .   Total Time in preparing paper work,  data evaluation and todays exam - 35 minutes  Leroy Sea M.D on 01/21/2016 at 3:29 PM  [facility]   Office  [phone]

## 2016-01-21 NOTE — Progress Notes (Signed)
PRN Vicodin and Morphine given last night for chest pressure. Vicodin did not relieve the pressure, but Morphine was effective.

## 2016-01-22 LAB — URINE CULTURE

## 2016-08-28 ENCOUNTER — Emergency Department (HOSPITAL_COMMUNITY)
Admission: EM | Admit: 2016-08-28 | Discharge: 2016-08-28 | Disposition: A | Discharge status: Home / Self Care | Payer: 59 | Attending: Emergency Medicine | Admitting: Emergency Medicine

## 2016-08-28 ENCOUNTER — Encounter (HOSPITAL_COMMUNITY): Payer: Self-pay | Admitting: *Deleted

## 2016-08-28 DIAGNOSIS — J0111 Acute recurrent frontal sinusitis: Secondary | ICD-10-CM | POA: Insufficient documentation

## 2016-08-28 DIAGNOSIS — R05 Cough: Secondary | ICD-10-CM | POA: Diagnosis present

## 2016-08-28 MED ORDER — AMOXICILLIN-POT CLAVULANATE 875-125 MG PO TABS: 1.0000 | ORAL_TABLET | Freq: Two times a day (BID) | ORAL | 0 refills | Status: DC

## 2016-08-28 MED ORDER — ONDANSETRON 4 MG PO TBDP: 4.0000 mg | ORAL_TABLET | Freq: Three times a day (TID) | ORAL | 0 refills | Status: DC | PRN

## 2016-08-28 MED ORDER — NAPROXEN 500 MG PO TABS
500.0000 mg | ORAL_TABLET | Freq: Two times a day (BID) | ORAL | 0 refills | Status: DC
Start: 2016-08-28 — End: 2019-05-26

## 2016-08-28 MED ORDER — BENZONATATE 100 MG PO CAPS: 100.0000 mg | ORAL_CAPSULE | Freq: Three times a day (TID) | ORAL | 0 refills | Status: DC

## 2016-08-28 NOTE — ED Notes (Signed)
See EDP assessment 

## 2016-08-28 NOTE — ED Provider Notes (Signed)
[facility]-EMERGENCY DEPT Provider Note   CSN: [id number] Arrival date & time: 08/28/16  1837  By signing my name below, I, [name], attest that this documentation has been prepared under the direction and in the presence of Phyllis Joy PA-C.   Electronically Signed: [name], ED Scribe. 08/28/16. 8:08 PM.  History   Chief Complaint Chief Complaint  Patient presents with  . Chest Pain   HPI HPI Comments: Phyllis Calhoun is a 41 y.o. female who presents to the Emergency Department complaining of persistent cough, sinus congestion and pressure, rhinorrhea, headache, and subjective fever intermittently for the last 2 weeks.   The day after onset of her symptoms she started taking OTC Mucinex. Her symptoms did resolve for a few days but then returned approximately one week ago and were worse. When her symptoms returned she had two days of intermittent vomiting and diarrhea, but then no further episodes. Since her symptoms have returned, she has experienced chest wall pain and tightness when coughing. No recent abx in the past 3 months. Denies shortness of breath, current chest discomfort, nausea, or any other complaints.   The history is provided by the patient. No language interpreter was used.    Past Medical History:  Diagnosis Date  . Arthritis    both legs  . Gallstones    HX OF SEVERAL GALLBLADDER ATTACKS  . Headache(784.0)    PT STATES MIGRAINES FROM TRIGEMINAL NEURALGIA  . Sleep apnea    sleep study confirmed  . Trigeminal neuralgia    2012-PT CONTINUES TO HAVE PAIN RIGHT SIDE OF FACE    Patient Active Problem List   Diagnosis Date Noted  . Chest pain 01/20/2016  . Trigeminal neuralgia 01/20/2016  . Shortness of breath 01/20/2016  . Acute medial meniscus tear of left knee 12/29/2015  . Acute lateral meniscus tear of left knee 12/29/2015  . 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 Morbid obesity (HCC) 12/29/2015    Past Surgical History:  Procedure Laterality  Date  . ABDOMINAL HYSTERECTOMY    . BREAST REDUCTION SURGERY    . CHOLECYSTECTOMY N/A 05/25/2013   Procedure: LAPAROSCOPIC CHOLECYSTECTOMY WITH INTRAOPERATIVE CHOLANGIOGRAM;  Surgeon: Axel Filler, MD;  Location: [facility];  Service: General;  Laterality: N/A;  . KNEE ARTHROSCOPY Left 12/29/2015   Procedure: ARTHROSCOPY KNEE;  Surgeon: Jodi Geralds, MD;  Location: [facility] OR;  Service: Orthopedics;  Laterality: Left;  . TONSILLECTOMY      OB History    No data available       Home Medications    Prior to Admission medications   Medication Sig Start Date End Date Taking? Authorizing Provider  acetaminophen (TYLENOL) 500 MG tablet Take 1,000 mg by mouth every 6 (six) hours as needed for pain. Pain.    Historical Provider, MD  amoxicillin-clavulanate (AUGMENTIN) 875-125 MG tablet Take 1 tablet by mouth every 12 (twelve) hours. 08/28/16   Phyllis C Joy, PA-C  benzonatate (TESSALON) 100 MG capsule Take 1 capsule (100 mg total) by mouth every 8 (eight) hours. 08/28/16   Phyllis C Joy, PA-C  HYDROcodone-acetaminophen (NORCO/VICODIN) 5-325 MG tablet Take 1 tablet by mouth every 6 (six) hours as needed for moderate pain.    Historical Provider, MD  naproxen (NAPROSYN) 500 MG tablet Take 500 mg by mouth 2 (two) times daily as needed for moderate pain.    Historical Provider, MD  naproxen (NAPROSYN) 500 MG tablet Take 1 tablet (500 mg total) by mouth 2 (two) times daily. 08/28/16   Phyllis  C Joy, PA-C  ondansetron (ZOFRAN ODT) 4 MG disintegrating tablet Take 1 tablet (4 mg total) by mouth every 8 (eight) hours as needed for nausea or vomiting. 08/28/16   Phyllis C Joy, PA-C  SUMAtriptan (IMITREX) 100 MG tablet Take 100 mg by mouth every 2 (two) hours as needed for migraine or headache. May repeat in 2 hours if headache persists or recurs.    Historical Provider, MD   Family History No family history on file.  Social History Social History  Substance Use Topics  . Smoking status: Never Smoker  . Smokeless  tobacco: Never Used  . Alcohol use No    Allergies   Patient has no known allergies.   Review of Systems Review of Systems  Constitutional: Positive for fever (resolved).  HENT: Positive for congestion and rhinorrhea.   Respiratory: Positive for cough. Negative for shortness of breath.   Gastrointestinal: Positive for diarrhea (resolved) and vomiting (resolved). Negative for abdominal pain.  Neurological: Positive for headaches. Negative for dizziness and light-headedness.  All other systems reviewed and are negative.    Physical Exam Updated Vital Signs BP 139/97 (BP Location: Left Arm)   Pulse 96   Temp 98.2 F (36.8 C) (Oral)   Resp 18   SpO2 96%   Physical Exam  Constitutional: She is oriented to person, place, and time. She appears well-developed and well-nourished. No distress.  HENT:  Head: Normocephalic and atraumatic.  Mouth/Throat: No oropharyngeal exudate.  Tenderness to frontal and maxillary sinuses bilaterally.  Eyes: Conjunctivae and EOM are normal. Pupils are equal, round, and reactive to light. Right eye exhibits no discharge. Left eye exhibits no discharge. No scleral icterus.  Cardiovascular: Normal rate, regular rhythm, normal heart sounds and intact distal pulses.  Exam reveals no gallop and no friction rub.   No murmur heard. Pulmonary/Chest: Effort normal and breath sounds normal. No respiratory distress. She has no wheezes. She has no rales. She exhibits no tenderness.  Abdominal: Soft. She exhibits no distension. There is no tenderness. There is no guarding.  Musculoskeletal: Normal range of motion. She exhibits no edema.  Neurological: She is alert and oriented to person, place, and time.  Skin: Skin is warm and dry. No rash noted. She is not diaphoretic. No erythema. No pallor.  Psychiatric: She has a normal mood and affect. Her behavior is normal.  Nursing note and vitals reviewed.   ED Treatments / Results  DIAGNOSTIC STUDIES: Oxygen  Saturation is 96% on RA, normal by my interpretation.  COORDINATION OF CARE: 7:50 PM-Discussed treatment plan with pt at bedside and pt agreed to plan.   Labs (all labs ordered are listed, but only abnormal results are displayed) Labs Reviewed - No data to display  EKG  EKG Interpretation None       Radiology No results found.  Procedures Procedures (including critical care time)  Medications Ordered in ED Medications - No data to display   Initial Impression / Assessment and Plan / ED Course  I have reviewed the triage vital signs and the nursing notes.  Pertinent labs & imaging results that were available during my care of the patient were reviewed by me and considered in my medical decision making (see chart for details).     Patient presents with symptoms that are consistent with a viral illness that caused a bacterial sinusitis. Patient states that this has been a recurrent problem for her. ENT referral given. Home care and return precautions discussed.   Vitals:  08/28/16 1903 08/28/16 2027  BP: 139/97 142/82  Pulse: 96 95  Resp: 18 18  Temp: 98.2 F (36.8 C) 98.1 F (36.7 C)  TempSrc: Oral Oral  SpO2: 96% 100%     Final Clinical Impressions(s) / ED Diagnoses   Final diagnoses:  Acute recurrent frontal sinusitis    New Prescriptions Discharge Medication List as of 08/28/2016  8:21 PM    START taking these medications   Details  amoxicillin-clavulanate (AUGMENTIN) 875-125 MG tablet Take 1 tablet by mouth every 12 (twelve) hours., Starting Wed 08/28/2016, Print    benzonatate (TESSALON) 100 MG capsule Take 1 capsule (100 mg total) by mouth every 8 (eight) hours., Starting Wed 08/28/2016, Print    !! naproxen (NAPROSYN) 500 MG tablet Take 1 tablet (500 mg total) by mouth 2 (two) times daily., Starting Wed 08/28/2016, Print    ondansetron (ZOFRAN ODT) 4 MG disintegrating tablet Take 1 tablet (4 mg total) by mouth every 8 (eight) hours as needed for  nausea or vomiting., Starting Wed 08/28/2016, Print     !! - Potential duplicate medications found. Please discuss with provider.     I personally performed the services described in this documentation, which was scribed in my presence. The recorded information has been reviewed and is accurate.    Anselm PancoastShawn C Joy, PA-C 08/31/16 0220    Lavera Guiseana Duo Liu, MD 09/03/16 [phone]

## 2016-08-28 NOTE — Discharge Instructions (Signed)
The pattern of your symptoms is consistent with the development of a possible bacterial sinus infection, possibly stemming from a viral infection. Please take all of your antibiotics until finished!   You may develop abdominal discomfort or diarrhea from the antibiotic.  You may help offset this with probiotics which you can buy or get in yogurt. Do not eat or take the probiotics until 2 hours after your antibiotic.   For your other symptoms, treatment is symptomatic care and it is important to note that these symptoms may last for 7-14 days. Symptoms will be intensified and complicated by dehydration. Dehydration can also extend the duration of symptoms. Drink plenty of fluids and get plenty of rest. You should be drinking at least half a liter of water an hour to stay hydrated. Electrolyte drinks are also encouraged. You should be drinking enough fluids to make your urine light yellow, almost clear. If this is not the case, you are not drinking enough water. Ibuprofen, Naproxen, or Tylenol for pain or fever. Zofran for nausea. Tessalon for cough. Plain Mucinex may help relieve congestion. Saline sinus rinses and saline nasal sprays may also help relieve congestion. Warm liquids or Chloraseptic spray may help soothe a sore throat. Follow up with a primary care provider, as needed, for any future management of this issue.

## 2016-08-28 NOTE — ED Notes (Signed)
E-signature not working. Pt states she understands d/c instructions.  

## 2016-08-28 NOTE — ED Triage Notes (Signed)
Pt states that she has had cough, congestion, fevers and body aches for 2 weeks.

## 2016-10-23 ENCOUNTER — Emergency Department (HOSPITAL_COMMUNITY)
Admission: EM | Admit: 2016-10-23 | Discharge: 2016-10-23 | Disposition: A | Discharge status: Home / Self Care | Payer: 59 | Attending: Emergency Medicine | Admitting: Emergency Medicine

## 2016-10-23 ENCOUNTER — Encounter (HOSPITAL_COMMUNITY): Payer: Self-pay | Admitting: Emergency Medicine

## 2016-10-23 ENCOUNTER — Emergency Department (HOSPITAL_COMMUNITY): Payer: 59

## 2016-10-23 DIAGNOSIS — Z79899 Other long term (current) drug therapy: Secondary | ICD-10-CM | POA: Insufficient documentation

## 2016-10-23 DIAGNOSIS — R0789 Other chest pain: Secondary | ICD-10-CM | POA: Diagnosis present

## 2016-10-23 LAB — CBC WITH DIFFERENTIAL/PLATELET
Basophils Absolute: 0 10*3/uL (ref 0.0–0.1)
Basophils Relative: 0 %
Eosinophils Absolute: 0.2 10*3/uL (ref 0.0–0.7)
Eosinophils Relative: 1 %
HEMATOCRIT: 37.6 % (ref 36.0–46.0)
HEMOGLOBIN: 11.6 g/dL — AB (ref 12.0–15.0)
LYMPHS PCT: 27 %
Lymphs Abs: 3.2 10*3/uL (ref 0.7–4.0)
MCH: 25.6 pg — AB (ref 26.0–34.0)
MCHC: 30.9 g/dL (ref 30.0–36.0)
MCV: 82.8 fL (ref 78.0–100.0)
MONOS PCT: 4 %
Monocytes Absolute: 0.4 10*3/uL (ref 0.1–1.0)
NEUTROS ABS: 8 10*3/uL — AB (ref 1.7–7.7)
NEUTROS PCT: 68 %
Platelets: 321 10*3/uL (ref 150–400)
RBC: 4.54 MIL/uL (ref 3.87–5.11)
RDW: 17.2 % — ABNORMAL HIGH (ref 11.5–15.5)
WBC: 11.8 10*3/uL — ABNORMAL HIGH (ref 4.0–10.5)

## 2016-10-23 LAB — I-STAT TROPONIN, ED: Troponin i, poc: 0 ng/mL (ref 0.00–0.08)

## 2016-10-23 LAB — BASIC METABOLIC PANEL
Anion gap: 9 (ref 5–15)
BUN: 9 mg/dL (ref 6–20)
CHLORIDE: 104 mmol/L (ref 101–111)
CO2: 25 mmol/L (ref 22–32)
Calcium: 9 mg/dL (ref 8.9–10.3)
Creatinine, Ser: 0.73 mg/dL (ref 0.44–1.00)
GFR calc Af Amer: >60 mL/min (ref >60)
GFR calc non Af Amer: >60 mL/min (ref >60)
GLUCOSE: 90 mg/dL (ref 65–99)
POTASSIUM: 4.4 mmol/L (ref 3.5–5.1)
Sodium: 138 mmol/L (ref 135–145)

## 2016-10-23 NOTE — ED Notes (Signed)
Pt reports that she starting having chest pain this morning on her way to work. Pt denies any N/V/D or radiation of pain associated with this chest pain.

## 2016-10-23 NOTE — ED Triage Notes (Signed)
Pt reports new onset of left sided chest pain with shortness of breath that comes and goes. Denies cardiac history. Pt refused EMS transfer. 140/78

## 2016-10-23 NOTE — Discharge Instructions (Signed)
Please take your muscle relaxer on a regular basis for the next several days as the most likely cause of your chest discomfort is a muscle strain form lifting the car seat and child last evening If you develop new symptoms such as shortness of breath, nausea, dizziness return for further evaluation

## 2016-10-23 NOTE — ED Provider Notes (Signed)
[facility]-EM[facility]Y DEPT Provider Note   CSN: [id number] Arrival date & time: 10/23/16  1628     History   Chief Complaint Chief Complaint  Patient presents with  . Chest Pain    HPI Phyllis Calhoun is a [age] y.o. female.  This a 41 year old morbidly obese female who presents with 13 hours of left chest discomfort and soreness.  He, states she woke this morning at 7 AM and noticed the discomfort.  She reports that last night she picked a car seat with infant up and felt a discomfort in her chest at that time, but it went away on its own. Denies any shortness of breath, nausea, dizziness, or diaphoresis.  Denies any recent illnesses, including URI symptoms, cough, shortness of breath.  Denies any recent travel or change in physical activities.      Past Medical History:  Diagnosis Date  . Arthritis    both legs  . Gallstones    HX OF SEVERAL GALLBLADDER ATTACKS  . Headache(784.0)    PT STATES MIGRAINES FROM TRIGEMINAL NEURALGIA  . Sleep apnea    sleep study confirmed  . Trigeminal neuralgia    2012-PT CONTINUES TO HAVE PAIN RIGHT SIDE OF FACE    Patient Active Problem List   Diagnosis Date Noted  . Chest pain 01/20/2016  . Trigeminal neuralgia 01/20/2016  . Shortness of breath 01/20/2016  . Acute medial meniscus tear of left knee 12/29/2015  . Acute lateral meniscus tear of left knee 12/29/2015  . 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 Morbid obesity (HCC) 12/29/2015    Past Surgical History:  Procedure Laterality Date  . ABDOMINAL HYSTERECTOMY    . BREAST REDUCTION SURGERY    . CHOLECYSTECTOMY N/A 05/25/2013   Procedure: LAPAROSCOPIC CHOLECYSTECTOMY WITH INTRAOPERATIVE CHOLANGIOGRAM;  Surgeon: Axel FillerArmando Ramirez, MD;  Location: [facility];  Service: General;  Laterality: N/A;  . KNEE ARTHROSCOPY Left 12/29/2015   Procedure: ARTHROSCOPY KNEE;  Surgeon: Jodi GeraldsJohn Graves, MD;  Location: [facility];  Service: Orthopedics;  Laterality: Left;  . TONSILLECTOMY      OB History    No  data available       Home Medications    Prior to Admission medications   Medication Sig Start Date End Date Taking? Authorizing Provider  acetaminophen (TYLENOL) 500 MG tablet Take 1,000 mg by mouth every 6 (six) hours as needed for pain. Pain.   Yes Historical Provider, MD  HYDROcodone-acetaminophen (NORCO) 10-325 MG tablet Take 1 tablet by mouth every 6 (six) hours as needed for moderate pain.   Yes Historical Provider, MD  meloxicam (MOBIC) 15 MG tablet Take 15 mg by mouth daily.   Yes Historical Provider, MD  methocarbamol (ROBAXIN) 750 MG tablet Take 750 mg by mouth every 8 (eight) hours as needed for muscle spasms. 10/16/16  Yes Historical Provider, MD  omeprazole (PRILOSEC) 20 MG capsule Take 20 mg by mouth daily.   Yes Historical Provider, MD  SUMAtriptan (IMITREX) 100 MG tablet Take 100 mg by mouth every 2 (two) hours as needed for migraine or headache. May repeat in 2 hours if headache persists or recurs.   Yes Historical Provider, MD  amoxicillin-clavulanate (AUGMENTIN) 875-125 MG tablet Take 1 tablet by mouth every 12 (twelve) hours. Patient not taking: Reported on 10/23/2016 08/28/16   Shawn C Joy, PA-C  benzonatate (TESSALON) 100 MG capsule Take 1 capsule (100 mg total) by mouth every 8 (eight) hours. Patient not taking: Reported on 10/23/2016 08/28/16   Anselm PancoastShawn C Joy, PA-C  naproxen (NAPROSYN) 500 MG tablet Take 1 tablet (500 mg total) by mouth 2 (two) times daily. Patient not taking: Reported on 10/23/2016 08/28/16   Shawn C Joy, PA-C  ondansetron (ZOFRAN ODT) 4 MG disintegrating tablet Take 1 tablet (4 mg total) by mouth every 8 (eight) hours as needed for nausea or vomiting. Patient not taking: Reported on 10/23/2016 08/28/16   Anselm Pancoast, PA-C    Family History No family history on file.  Social History Social History  Substance Use Topics  . Smoking status: Never Smoker  . Smokeless tobacco: Never Used  . Alcohol use No     Allergies   Patient has no known  allergies.   Review of Systems Review of Systems  Constitutional: Negative for fever.  Respiratory: Positive for chest tightness. Negative for cough and shortness of breath.   Cardiovascular: Positive for chest pain. Negative for palpitations and leg swelling.  Neurological: Negative for weakness.  All other systems reviewed and are negative.    Physical Exam Updated Vital Signs BP 108/86 (BP Location: Left Wrist)   Pulse 85   Temp 98.2 F (36.8 C) (Oral)   Resp 16   Wt (!) 203 kg   SpO2 100%   BMI 68.05 kg/m   Physical Exam  Constitutional: She appears well-developed and well-nourished. No distress.  HENT:  Head: Normocephalic.  Eyes: Pupils are equal, round, and reactive to light.  Neck: Normal range of motion.  Cardiovascular: Normal rate and regular rhythm.   Pulmonary/Chest: Effort normal.  Musculoskeletal: Normal range of motion.  Neurological: She is alert.  Skin: Skin is warm.  Nursing note and vitals reviewed.    ED Treatments / Results  Labs (all labs ordered are listed, but only abnormal results are displayed) Labs Reviewed  CBC WITH DIFFERENTIAL/PLATELET - Abnormal; Notable for the following:       Result Value   WBC 11.8 (*)    Hemoglobin 11.6 (*)    MCH 25.6 (*)    RDW 17.2 (*)    Neutro Abs 8.0 (*)    All other components within normal limits  BASIC METABOLIC PANEL  I-STAT TROPOININ, ED    EKG  EKG Interpretation  Date/Time:  Wednesday October 23 2016 16:35:10 EDT Ventricular Rate:  88 PR Interval:  146 QRS Duration: 72 QT Interval:  368 QTC Calculation: 445 R Axis:   42 Text Interpretation:  Normal sinus rhythm Cannot rule out Anterior infarct , age undetermined Abnormal ECG No significant change since last tracing Confirmed by KNAPP  MD-J, JON [phone]) on 10/23/2016 11:39:29 PM       Radiology Dg Chest 2 View  Result Date: 10/23/2016 CLINICAL DATA:  Midline and left upper chest pain. Shortness of breath. EXAM: CHEST  2 VIEW  COMPARISON:  01/20/2016 FINDINGS: Chronic elevation of the right hemidiaphragm. Both lungs are clear. Heart and mediastinum are within normal limits. The trachea is midline. No pleural effusions. No acute bone abnormality. IMPRESSION: No active cardiopulmonary disease. Electronically Signed   By: Richarda Overlie M.D.   On: 10/23/2016 17:22    Procedures Procedures (including critical care time)  Medications Ordered in ED Medications - No data to display   Initial Impression / Assessment and Plan / ED Course  I have reviewed the triage vital signs and the nursing notes.  Pertinent labs & imaging results that were available during my care of the patient were reviewed by me and considered in my medical decision making (see chart for details).  Labs EKG and chest x-ray reviewed and are normal. Review of patient's records show that she had a cardiac evaluation in June 2017 which was normal.  She's had no further episodes of chest pain since that time until today She has been encouraged to continue taking her pain medication as well as her muscle relaxer.  She's been cautioned to return if she develops nausea, diaphoresis and shortness of breath Final Clinical Impressions(s) / ED Diagnoses   Final diagnoses:  Atypical chest pain    New Prescriptions Discharge Medication List as of 10/23/2016  8:45 PM       Earley Favor, NP 10/23/16 9604    Earley Favor, NP 10/24/16 5409    Linwood Dibbles, MD 10/24/16 [phone]

## 2018-01-15 DIAGNOSIS — M1710 Unilateral primary osteoarthritis, unspecified knee: Secondary | ICD-10-CM | POA: Diagnosis not present

## 2018-02-03 DIAGNOSIS — Z79899 Other long term (current) drug therapy: Secondary | ICD-10-CM | POA: Diagnosis not present

## 2018-02-03 DIAGNOSIS — Z79891 Long term (current) use of opiate analgesic: Secondary | ICD-10-CM | POA: Diagnosis not present

## 2018-02-03 DIAGNOSIS — M1710 Unilateral primary osteoarthritis, unspecified knee: Secondary | ICD-10-CM | POA: Diagnosis not present

## 2018-02-03 DIAGNOSIS — G894 Chronic pain syndrome: Secondary | ICD-10-CM | POA: Diagnosis not present

## 2018-03-04 DIAGNOSIS — G43909 Migraine, unspecified, not intractable, without status migrainosus: Secondary | ICD-10-CM | POA: Diagnosis not present

## 2018-03-04 DIAGNOSIS — G500 Trigeminal neuralgia: Secondary | ICD-10-CM | POA: Diagnosis not present

## 2018-03-05 DIAGNOSIS — M1710 Unilateral primary osteoarthritis, unspecified knee: Secondary | ICD-10-CM | POA: Diagnosis not present

## 2018-03-05 DIAGNOSIS — G894 Chronic pain syndrome: Secondary | ICD-10-CM | POA: Diagnosis not present

## 2018-03-05 DIAGNOSIS — M25569 Pain in unspecified knee: Secondary | ICD-10-CM | POA: Diagnosis not present

## 2018-03-12 DIAGNOSIS — M1712 Unilateral primary osteoarthritis, left knee: Secondary | ICD-10-CM | POA: Diagnosis not present

## 2018-04-08 DIAGNOSIS — M1710 Unilateral primary osteoarthritis, unspecified knee: Secondary | ICD-10-CM | POA: Diagnosis not present

## 2018-04-08 DIAGNOSIS — G894 Chronic pain syndrome: Secondary | ICD-10-CM | POA: Diagnosis not present

## 2018-04-08 DIAGNOSIS — M1712 Unilateral primary osteoarthritis, left knee: Secondary | ICD-10-CM | POA: Diagnosis not present

## 2018-04-09 DIAGNOSIS — M1712 Unilateral primary osteoarthritis, left knee: Secondary | ICD-10-CM | POA: Diagnosis not present

## 2018-05-13 DIAGNOSIS — R05 Cough: Secondary | ICD-10-CM | POA: Diagnosis not present

## 2018-05-13 DIAGNOSIS — J209 Acute bronchitis, unspecified: Secondary | ICD-10-CM | POA: Diagnosis not present

## 2018-05-29 ENCOUNTER — Emergency Department (HOSPITAL_COMMUNITY)
Admission: EM | Admit: 2018-05-29 | Discharge: 2018-05-29 | Disposition: A | Discharge status: Home / Self Care | Payer: 59 | Attending: Emergency Medicine | Admitting: Emergency Medicine

## 2018-05-29 ENCOUNTER — Encounter (HOSPITAL_COMMUNITY): Payer: Self-pay

## 2018-05-29 ENCOUNTER — Emergency Department (HOSPITAL_COMMUNITY): Payer: 59

## 2018-05-29 ENCOUNTER — Other Ambulatory Visit: Payer: Self-pay

## 2018-05-29 DIAGNOSIS — Z79899 Other long term (current) drug therapy: Secondary | ICD-10-CM | POA: Insufficient documentation

## 2018-05-29 DIAGNOSIS — M25562 Pain in left knee: Secondary | ICD-10-CM | POA: Insufficient documentation

## 2018-05-29 DIAGNOSIS — S83402A Sprain of unspecified collateral ligament of left knee, initial encounter: Secondary | ICD-10-CM | POA: Diagnosis not present

## 2018-05-29 NOTE — ED Triage Notes (Signed)
Pt walking down stairs twisted left knee and now having pain in the knee.  Did not hit her knee. Increased swelling to area.  Injury occurred 2 days ago.

## 2018-05-29 NOTE — ED Provider Notes (Signed)
[facility] EMERGENCY DEPARTMENT Provider Note   CSN: [id number] Arrival date & time: 05/29/18  0132     History   Chief Complaint Chief Complaint  Patient presents with  . Knee Injury  . Knee Pain    left    HPI Phyllis Calhoun is a 42 y.o. female with history of arthritis, gallstones, migraines, trigeminal neuralgia presents for evaluation of acute onset, constant left knee pain for 3 days.  She states that as she was stepping down a flight of stairs she "misstepped and twisted my left knee ".  Denies fall, head injury, or loss of consciousness.  Since then that she notes pain "deep inside the knee "which does not radiate but worsens with bending and attempts to ambulate.  She has been elevating the extremity and applying ice.  Denies fevers, numbness, weakness.  She states this feels like when she tore her meniscus a few years ago which was managed by [facility] orthopedics.  The history is provided by the patient.    Past Medical History:  Diagnosis Date  . Arthritis    both legs  . Gallstones    HX OF SEVERAL GALLBLADDER ATTACKS  . Headache(784.0)    PT STATES MIGRAINES FROM TRIGEMINAL NEURALGIA  . Sleep apnea    sleep study confirmed  . Trigeminal neuralgia    2012-PT CONTINUES TO HAVE PAIN RIGHT SIDE OF FACE    Patient Active Problem List   Diagnosis Date Noted  . Chest pain 01/20/2016  . Trigeminal neuralgia 01/20/2016  . Shortness of breath 01/20/2016  . Acute medial meniscus tear of left knee 12/29/2015  . Acute lateral meniscus tear of left knee 12/29/2015  . 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 Morbid obesity (HCC) 12/29/2015    Past Surgical History:  Procedure Laterality Date  . ABDOMINAL HYSTERECTOMY    . BREAST REDUCTION SURGERY    . CHOLECYSTECTOMY N/A 05/25/2013   Procedure: LAPAROSCOPIC CHOLECYSTECTOMY WITH INTRAOPERATIVE CHOLANGIOGRAM;  Surgeon: Axel Filler, MD;  Location: [facility] ORS;  Service: General;  Laterality:  N/A;  . KNEE ARTHROSCOPY Left 12/29/2015   Procedure: ARTHROSCOPY KNEE;  Surgeon: Jodi Geralds, MD;  Location: [facility] OR;  Service: Orthopedics;  Laterality: Left;  . TONSILLECTOMY       OB History   None      Home Medications    Prior to Admission medications   Medication Sig Start Date End Date Taking? Authorizing Provider  acetaminophen (TYLENOL) 500 MG tablet Take 1,000 mg by mouth every 6 (six) hours as needed for pain. Pain.    [provider]  amoxicillin-clavulanate (AUGMENTIN) 875-125 MG tablet Take 1 tablet by mouth every 12 (twelve) hours. Patient not taking: Reported on 10/23/2016 08/28/16   Joy, Hillard Danker, PA-C  benzonatate (TESSALON) 100 MG capsule Take 1 capsule (100 mg total) by mouth every 8 (eight) hours. Patient not taking: Reported on 10/23/2016 08/28/16   Anselm Pancoast, PA-C  HYDROcodone-acetaminophen (NORCO) 10-325 MG tablet Take 1 tablet by mouth every 6 (six) hours as needed for moderate pain.    [provider]  meloxicam (MOBIC) 15 MG tablet Take 15 mg by mouth daily.    [provider]  methocarbamol (ROBAXIN) 750 MG tablet Take 750 mg by mouth every 8 (eight) hours as needed for muscle spasms. 10/16/16   [provider]  naproxen (NAPROSYN) 500 MG tablet Take 1 tablet (500 mg total) by mouth 2 (two) times daily. Patient not taking: Reported on 10/23/2016 08/28/16  Joy, Shawn C, PA-C  omeprazole (PRILOSEC) 20 MG capsule Take 20 mg by mouth daily.    [provider]  ondansetron (ZOFRAN ODT) 4 MG disintegrating tablet Take 1 tablet (4 mg total) by mouth every 8 (eight) hours as needed for nausea or vomiting. Patient not taking: Reported on 10/23/2016 08/28/16   Anselm Pancoast, PA-C  SUMAtriptan (IMITREX) 100 MG tablet Take 100 mg by mouth every 2 (two) hours as needed for migraine or headache. May repeat in 2 hours if headache persists or recurs.    [provider]    Family History History reviewed. No pertinent family  history.  Social History Social History   Tobacco Use  . Smoking status: Never Smoker  . Smokeless tobacco: Never Used  Substance Use Topics  . Alcohol use: No  . Drug use: No     Allergies   Patient has no known allergies.   Review of Systems Review of Systems  Constitutional: Negative for fever.  Musculoskeletal: Positive for arthralgias.  Neurological: Negative for weakness and numbness.     Physical Exam Updated Vital Signs BP 129/84 (BP Location: Left Wrist)   Pulse 93   Temp 98 F (36.7 C) (Oral)   Resp 20   SpO2 98%   Physical Exam  Constitutional: She appears well-developed and well-nourished. No distress.  Pleasant morbidly obese female, resting comfortably in bed  HENT:  Head: Normocephalic and atraumatic.  Eyes: Conjunctivae are normal. Right eye exhibits no discharge. Left eye exhibits no discharge.  Neck: No JVD present. No tracheal deviation present.  Cardiovascular: Normal rate and intact distal pulses.  2+ DP/PT pulses bilaterally, Homans sign absent bilaterally, no lower extremity edema.  No palpable cords.  Pulmonary/Chest: Effort normal.  Abdominal: She exhibits no distension.  Musculoskeletal: She exhibits tenderness. She exhibits no edema.  Tenderness to palpation along the medial and lateral meniscus of the left knee.  No tenderness to palpation overlying the patella or quadriceps tendon.  No deformity, crepitus, ecchymosis, warmth, or erythema.  Slightly decreased range of motion with flexion of the left knee secondary to pain.  No ligamentous laxity or varus or valgus instability noted.  Negative anterior/posterior drawer test.  5/5 strength of BLE major muscle groups  Neurological: She is alert.  Fluent speech, no facial droop, sensation intact to soft touch of bilateral lower extremities.  Ambulates with a mildly antalgic gait but is able to Heel Walk and Toe Walk without difficulty.  Skin: Skin is warm and dry. No erythema.  Psychiatric:  She has a normal mood and affect. Her behavior is normal.  Nursing note and vitals reviewed.    ED Treatments / Results  Labs (all labs ordered are listed, but only abnormal results are displayed) Labs Reviewed - No data to display  EKG None  Radiology Dg Knee Complete 4 Views Left  Result Date: 05/29/2018 CLINICAL DATA:  Twisted knee.  Knee pain. EXAM: LEFT KNEE - COMPLETE 4+ VIEW COMPARISON:  MRI 10/28/2015.  Plain films 08/14/2015. FINDINGS: Advanced tricompartment degenerative changes with joint space narrowing and spurring. Lucency noted through the patella on the AP view only. This is not seen on any additional views. There is no joint effusion. No subluxation or dislocation. IMPRESSION: Vertical lucency through the patella on the AP view only. No joint effusion. I suspect this is a pseudo fracture, but recommend correlation for pain over the patella to exclude fracture. Advanced tricompartment degenerative changes. Electronically Signed   By: Charlett Nose M.D.  On: 05/29/2018 02:33    Procedures Procedures (including critical care time)  Medications Ordered in ED Medications - No data to display   Initial Impression / Assessment and Plan / ED Course  I have reviewed the triage vital signs and the nursing notes.  Pertinent labs & imaging results that were available during my care of the patient were reviewed by me and considered in my medical decision making (see chart for details).      Patient with left knee pain after miss stepping on her left knee.  She is afebrile, vital signs are stable.  She is nontoxic in appearance.  Has had prior meniscus surgery on this knee.  No risk factors for septic joint, presentation is not consistent with septic joint, osteomyelitis, DVT.  No sign of secondary skin infection.  She is neurovascularly intact and ambulatory despite pain.  Radiographs show a vertical lucency through the patella but she has no tenderness to palpation overlying  the patella.  Tenderness is limited to the joint line.  Recommend follow-up with orthopedics for reevaluation of symptoms. Conservative therapy recommended and discussed.  Discussed strict ED return precautions. Pt verbalized understanding of and agreement with plan and is safe for discharge home at this time.   Final Clinical Impressions(s) / ED Diagnoses   Final diagnoses:  Acute pain of left knee    ED Discharge Orders    None       Jeanie Sewer, PA-C 05/29/18 0626    Ward, Layla Maw, DO 05/29/18 [phone]

## 2018-05-29 NOTE — Discharge Instructions (Signed)
1. Medications: Alternate 600 mg of ibuprofen and [phone] mg of Tylenol every 3 hours as needed for pain. Do not exceed 4000 mg of Tylenol daily.  Take ibuprofen with food to avoid upset stomach issues.  2. Treatment: rest, ice, elevate and use cane when trying to walk, wrap with ace wrap or knee brace, drink plenty of fluids, gentle stretching 3. Follow Up: Please followup with orthopedics as directed or your PCP in 1 week if no improvement for discussion of your diagnoses and further evaluation after today's visit; Please return to the ER for worsening symptoms or other concerns such as worsening swelling, redness of the skin, fevers, loss of pulses, or loss of feeling

## 2018-06-24 DIAGNOSIS — R5383 Other fatigue: Secondary | ICD-10-CM | POA: Diagnosis not present

## 2018-06-24 DIAGNOSIS — R635 Abnormal weight gain: Secondary | ICD-10-CM | POA: Diagnosis not present

## 2018-06-24 DIAGNOSIS — E559 Vitamin D deficiency, unspecified: Secondary | ICD-10-CM | POA: Diagnosis not present

## 2018-06-24 DIAGNOSIS — R0602 Shortness of breath: Secondary | ICD-10-CM | POA: Diagnosis not present

## 2018-07-22 DIAGNOSIS — R635 Abnormal weight gain: Secondary | ICD-10-CM | POA: Diagnosis not present

## 2018-07-22 DIAGNOSIS — E559 Vitamin D deficiency, unspecified: Secondary | ICD-10-CM | POA: Diagnosis not present

## 2018-11-02 ENCOUNTER — Encounter: Payer: Self-pay | Admitting: *Deleted

## 2018-11-02 ENCOUNTER — Telehealth: Payer: Self-pay | Admitting: *Deleted

## 2018-11-02 NOTE — Telephone Encounter (Signed)
Spoke with patient regarding her 11/04/2018 appt with Dr. Lucia Gaskins. I advised that d/t covid 19 pandemic, our office is severely reducing in person visits for at least the next two weeks, in order to minimize the risk to our patients and healthcare providers. Advised that we recommend patient convert to a telemedicine visit. Although there may be some limitations with this type of visit, we will take all precautions to reduce any security or privacy concerns. This will be treated like an in-office visit and we will file with your insurance, and there may be a patient responsible charge related to this service. The copay will be waived for this televisit. The patient consented to do a webex video visit and for the appt to be filed with her insurance. Confirmed pt's email address. She will use her smart phone. She is aware to download the [employer]. Camera and audio must be functioning on her device for the appt. Pt aware that she will receive an email with invitation to join meetings about 5 minutes before her appt. She was encouraged to call with any questions. I updated her medical/social/surgical/family hx, medications, and allergies in her chart, pt historian.I r/s the patient (per her request) for Tuesday 11/03/2018 @ 1:30 pm.  She verbalized appreciation for the call.

## 2018-11-02 NOTE — Progress Notes (Signed)
Pt provided her updated weight & height via phone call. Chart updated.

## 2018-11-03 ENCOUNTER — Ambulatory Visit (INDEPENDENT_AMBULATORY_CARE_PROVIDER_SITE_OTHER): Payer: BLUE CROSS/BLUE SHIELD | Admitting: Neurology

## 2018-11-03 ENCOUNTER — Other Ambulatory Visit: Payer: Self-pay

## 2018-11-03 ENCOUNTER — Telehealth: Payer: Self-pay | Admitting: Neurology

## 2018-11-03 ENCOUNTER — Encounter: Payer: Self-pay | Admitting: Neurology

## 2018-11-03 DIAGNOSIS — G43711 Chronic migraine without aura, intractable, with status migrainosus: Secondary | ICD-10-CM

## 2018-11-03 DIAGNOSIS — G4733 Obstructive sleep apnea (adult) (pediatric): Secondary | ICD-10-CM

## 2018-11-03 DIAGNOSIS — G500 Trigeminal neuralgia: Secondary | ICD-10-CM

## 2018-11-03 DIAGNOSIS — R519 Headache, unspecified: Secondary | ICD-10-CM

## 2018-11-03 DIAGNOSIS — R51 Headache: Secondary | ICD-10-CM

## 2018-11-03 DIAGNOSIS — S0430XA Injury of trigeminal nerve, unspecified side, initial encounter: Secondary | ICD-10-CM

## 2018-11-03 MED ORDER — TOPIRAMATE 100 MG PO TABS: 100.0000 mg | ORAL_TABLET | Freq: Two times a day (BID) | ORAL | 6 refills | Status: DC

## 2018-11-03 NOTE — Telephone Encounter (Signed)
Due to current COVID 19 pandemic, our office is severely reducing in office visits for at least the next 2 weeks, in order to minimize the risk to our patients and healthcare providers. Our staff will contact you for next steps. Pt understands that although there may be some limitations with this type of visit, we will take all precautions to reduce any security or privacy concerns. Pt understands that this will be treated like an in office visit and we will file with pt's insurance, and there may be a patient responsible charge related to this service. Pt confirmed current e-mail address is [email or web address]

## 2018-11-03 NOTE — Progress Notes (Addendum)
[facility]    Provider:  Dr Jaynee Eagles Requesting Provider: London Pepper, MD Primary Care Provider:  London Pepper, MD  CC:  migraines  Virtual Visit via Video Note  I connected with Phyllis Calhoun on 18/[birth year] at  1:30 PM EDT by a video enabled telemedicine application and verified that I am speaking with the correct person using two identifiers.   I discussed the limitations of evaluation and management by telemedicine and the availability of in person appointments. The patient expressed understanding and agreed to proceed.  Melvenia Beam, MD  HPI:  Phyllis Calhoun is a 43 y.o. female here as requested by London Pepper, MD for migraines. PMHx morbid obesity, insomnia, migraine, trigeminal neuralgia status post gamma knife. She is on Topiramate 50 mg twice daily and sumatriptan.  Patient has obstructive sleep apnea and is untreated.  Her trigeminal neuralgia began in 2011 after a root canal procedure.  She is aware that she is overweight and attributes her trigeminal neuralgia to weight gain since 2011.  Although she realizes she did not lose weight in the last year even though her trigeminal neuralgia was under control.  She also realizes she has been overweight for a long time.  She tried topiramate and when she got to 100 mg she had side effects dizziness became numb.  She has 2 to 3 days weeks with the headaches.  She misses work about 7 to 8 days because of her headaches.  She also frequently overuses her medications over-the-counter per past medical records and developed medication overuse headache. There is lots of stress and that makes things worse. The trigeminal neuralgia is worse and it flares the migraines. She wakes with migraines in the morning. Her trigeminal neuralgia is every day, triggers are touching her face or chewing, it is severe and brief, In all 3 branches of the trigeminal nerve. She is on Topiramate twice daily. Her migraines are triggered by  irritation, stress, trigeminal neuralgia flair. They start at the back of the head, like someone standing on her shoulder, she has light and sound sensitivity, nausea, she can;t concentrate, laying down helps and she can;t function. She takes hydrocodone 4x a day and is aware these can worsen headaches but no ibuprofen, tylenol or goody powders. She takes Meloxicam.    Migraine medications tried: Topiramate, Robaxin, meloxicam, verapamil, gamma knife, occipital nerve blocks, renal palate team ganglion blocks, ketamine infusions, carbamazepine, gabapentin, Botox, Effexor, Butrans patches.  She had gamma knife procedure in 2011, occipital blocks, transnasal sphenopalatine nerve blocks and 3 ketamine infusions.  She is also taken ibuprofen, Tylenol and prior physicians concern for medication overuse headaches she does not take caffeine..  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  Reviewed lab work.  Labs were taken September 12, 2018.  BUN 10, creatinine 0.87, c-Met normal, CBC normal, TSH 1.52.   Patient's had trigeminal neuralgia status post gamma knife.  She had a possible vasovagal episode in the past.  Migraines and trigeminal neuralgia.  She was seen for morbid obesity and is followed with bariatric surgery in hopes of future weight loss surgery.  She has had a few syncopal episodes in the setting of migraine.  She was laying in bed was getting up to go to the bathroom and must have fainted.  She had noted a migraine  all day.  Has followed with neurology for migraines and trigeminal neuralgia.  She also stopped going to pain medicine and has been off pain medicines since October.  She also takes Tylenol.  She has been on verapamil in the past, meloxicam, hydrocodone, Robaxin.  She feels her trigeminal neuralgia is worse, the  pain is constant but hurts if she touches her lip, the pain feels like a bad toothache, like an irritating aching pain.  She has sharp pains that run through her mouth going crazy in the entire right side of her head starts to hurt and she feels it sets off her headaches.  She denies ipsilateral autonomic features with her headache.  Severe pain 1 to 2 minutes and goes back and forth from the bottom nerve to the top nerve, the entire right side is shooting pain.  Worsening.  Personally reviewed CT of the head images completed in February 2015 for frontal headache, dizziness, right upper and lower extremity numbness:   Ct showed No acute intracranial abnormalities including mass lesion or mass effect, hydrocephalus, extra-axial fluid collection, midline shift, hemorrhage, or acute infarction, large ischemic events (personally reviewed images)     Review of Systems: Patient complains of symptoms per HPI as well as the following symptoms" migraines, obesity, knee pain, trigem neuralgia, back pain. Pertinent negatives and positives per HPI. All others negative.   Social History   Socioeconomic History   Marital status: Married    Spouse name: Not on file   Number of children: 2   Years of education: Not on file   Highest education level: Some college, no degree  Occupational History   Not on file  Social Needs   Financial resource strain: Not on file   Food insecurity:    Worry: Not on file    Inability: Not on file   Transportation needs:    Medical: Not on file    Non-medical: Not on file  Tobacco Use   Smoking status: Never Smoker   Smokeless tobacco: Never Used  Substance and Sexual Activity   Alcohol use: No   Drug use: No   Sexual activity: Not on file  Lifestyle   Physical activity:    Days per week: Not on file    Minutes per session: Not on file   Stress: Not on file  Relationships   Social connections:    Talks on phone: Not on file    Gets  together: Not on file    Attends religious service: Not on file    Active member of club or organization: Not on file    Attends meetings of clubs or organizations: Not on file    Relationship status: Not on file   Intimate partner violence:    Fear of current or ex partner: Not on file    Emotionally abused: Not on file    Physically abused: Not on file    Forced sexual activity: Not on file  Other Topics Concern   Not on file  Social History Narrative   Lives at home with her husband, parents, and her son   Right handed   Caffeine: mostly water, tea here and there    Family History  Problem Relation Age of Onset   Migraines Neg Hx     Past Medical History:  Diagnosis Date   Arthritis    both legs   Gallstones    HX OF SEVERAL GALLBLADDER ATTACKS   Headache(784.0)    PT STATES MIGRAINES FROM  TRIGEMINAL NEURALGIA   Sleep apnea    sleep study confirmed   Trigeminal neuralgia    2012-PT CONTINUES TO HAVE PAIN RIGHT SIDE OF FACE    Patient Active Problem List   Diagnosis Date Noted   Chest pain 01/20/2016   Trigeminal neuralgia 01/20/2016   Shortness of breath 01/20/2016   Acute medial meniscus tear of left knee 12/29/2015   Acute lateral meniscus tear of left knee 12/29/2015   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   Morbid obesity ([place]) 12/29/2015    Past Surgical History:  Procedure Laterality Date   ABDOMINAL HYSTERECTOMY     BREAST REDUCTION SURGERY     CHOLECYSTECTOMY N/A 05/25/2013   Procedure: LAPAROSCOPIC CHOLECYSTECTOMY WITH INTRAOPERATIVE CHOLANGIOGRAM;  Surgeon: Ralene Ok, MD;  Location: [facility];  Service: General;  Laterality: N/A;   KNEE ARTHROSCOPY Left 12/29/2015   Procedure: ARTHROSCOPY KNEE;  Surgeon: Dorna Leitz, MD;  Location: [facility];  Service: Orthopedics;  Laterality: Left;   TONSILLECTOMY      Current Outpatient Medications  Medication Sig Dispense Refill   acetaminophen (TYLENOL) 500 MG tablet Take 1,000 mg by  mouth every 6 (six) hours as needed for pain. Pain.     benzonatate (TESSALON) 100 MG capsule Take 1 capsule (100 mg total) by mouth every 8 (eight) hours. (Patient not taking: Reported on 10/23/2016) 21 capsule 0   diclofenac sodium (VOLTAREN) 1 % GEL APPLY 4 GRAMS 2 TO 3 TIMES DAILY AS NEEDED     Fremanezumab-vfrm (AJOVY) 225 MG/1.5ML SOSY Inject 225 mg into the skin every 30 (thirty) days. 1 Syringe 11   HYDROcodone-acetaminophen (NORCO) 10-325 MG tablet Take 1 tablet by mouth every 6 (six) hours as needed for moderate pain.     meloxicam (MOBIC) 15 MG tablet Take 15 mg by mouth daily.     methocarbamol (ROBAXIN) 750 MG tablet Take 750 mg by mouth every 8 (eight) hours as needed for muscle spasms.  1   naproxen (NAPROSYN) 500 MG tablet Take 1 tablet (500 mg total) by mouth 2 (two) times daily. (Patient not taking: Reported on 10/23/2016) 30 tablet 0   phentermine (ADIPEX-P) 37.5 MG tablet Take 37.5 mg by mouth daily.     SUMAtriptan (IMITREX) 100 MG tablet Take 100 mg by mouth every 2 (two) hours as needed for migraine or headache. May repeat in 2 hours if headache persists or recurs.     topiramate (TOPAMAX) 100 MG tablet Take 1 tablet (100 mg total) by mouth 2 (two) times daily. 60 tablet 6   Vitamin D, Ergocalciferol, (DRISDOL) 1.25 MG (50000 UT) CAPS capsule Take 50,000 Units by mouth once a week.     No current facility-administered medications for this visit.     Allergies as of 11/03/2018   (Not on File)    Vitals: There were no vitals taken for this visit. Last Weight:  Wt Readings from Last 1 Encounters:  11/02/18 (!) 400 lb (181.4 kg)   Last Height:   Ht Readings from Last 1 Encounters:  11/02/18 _0  (1.727 m)     Physical exam: Exam:    Physical exam: Exam: Gen: NAD, conversant      CV: attempted, Could not perform over Web Video  Eyes: Conjunctivae clear without exudates or hemorrhage  Neuro: Detailed Neurologic Exam  Speech:    Speech is  normal; fluent and spontaneous with normal comprehension.  Cognition:    The patient is oriented to person, place, and time;     recent  and remote memory intact;     language fluent;     normal attention, concentration,     fund of knowledge Cranial Nerves:    The pupils are equal, round, and reactive to light. Attempted, Cannot perform fundoscopic exam. Visual fields are full to finger confrontation. Extraocular movements are intact.  The face is symmetric with normal sensation. The palate elevates in the midline. Hearing intact. Voice is normal. Shoulder shrug is normal. The tongue has normal motion without fasciculations.   Coordination:    Normal finger to nose  Gait:    Normal native gait  Motor Observation:   no involuntary movements noted. Tone:    Appears normal  Posture:    Posture is normal. normal erect    Strength:    Strength is anti-gravity and symmetric in the upper and lower limbs.      Sensation: intact to LT     Reflex Exam:  DTR's:    Attempted, Could not perform over Web Video   Toes: Attempted Could not perform over Web Video  Clonus:   Attempted, Could not perform over Web Video     Assessment/Plan:  Patient with intractable migraines and trigeminal neuralgia  -Years ago MRI suggested there may be a vascular loop compressing the trigeminal nerve.  At this point patient has exhausted multiple medications and procedures including gamma knife for her trigeminal neuralgia.  If there is a vascular loop compressing the trigeminal nerve she is in favor of neurosurgical decompression.  At this time we will order an MRI with a specialized protocol to see if there is any compression of the trigeminal nerve.  - [facility] sleep evaluation: Patient has untreated sleep apnea.  Untreated sleep apnea can cause a plethora of severe symptoms and sequelae.  It can also cause intractable daily headaches.  I did discuss with patient that there is a possibility that her sleep  apnea is highly contributing to her headaches and that only the treatment of sleep apnea will help.  She is also at high risk for serious sequelae such as stroke and cardiovascular events.   - Discussed Tegretol and Trileptal at this time will increase Topiramate and if no help with switch to Trileptal.  Morbid Obesity: [facility], Caren Flanders MD  Ajovy: Start Ajovy for migraine prevention.   Follow Up Instructions:    I discussed the assessment and treatment plan with the patient. The patient was provided an opportunity to ask questions and all were answered. The patient agreed with the plan and demonstrated an understanding of the instructions.   The patient was advised to call back or seek an in-person evaluation if the symptoms worsen or if the condition fails to improve as anticipated.  I provided 80 minutes of non-face-to-face time during this encounter.  Orders Placed This Encounter  Procedures   MR FACE/TRIGEMINAL WO/W CM   Ambulatory referral to Sleep Studies   Ambulatory referral to [facility]   Meds ordered this encounter  Medications   topiramate (TOPAMAX) 100 MG tablet    Sig: Take 1 tablet (100 mg total) by mouth 2 (two) times daily.    Dispense:  60 tablet    Refill:  6   Fremanezumab-vfrm (AJOVY) 225 MG/1.5ML SOSY    Sig: Inject 225 mg into the skin every 30 (thirty) days.    Dispense:  1 Syringe    Refill:  11    Cc: London Pepper, MD,  London Pepper, MD  Sarina Ill,  MD  [facility] [address] [place], [place] [address]  Phone [phone] Fax [phone]

## 2018-11-04 ENCOUNTER — Telehealth: Payer: Self-pay | Admitting: Neurology

## 2018-11-04 ENCOUNTER — Encounter: Payer: Self-pay | Admitting: Neurology

## 2018-11-04 ENCOUNTER — Ambulatory Visit: Payer: BLUE CROSS/BLUE SHIELD | Admitting: Neurology

## 2018-11-04 ENCOUNTER — Encounter: Payer: Self-pay | Admitting: *Deleted

## 2018-11-04 ENCOUNTER — Telehealth: Payer: Self-pay | Admitting: *Deleted

## 2018-11-04 MED ORDER — FREMANEZUMAB-VFRM 225 MG/1.5ML ~~LOC~~ SOSY: 225.0000 mg | PREFILLED_SYRINGE | SUBCUTANEOUS | 11 refills | Status: AC

## 2018-11-04 NOTE — Telephone Encounter (Signed)
[employer] Auth: NPR spoke to [place] Ref # [id number] order sent to [facility]. They will reach out to the pt to schedule.

## 2018-11-04 NOTE — Telephone Encounter (Signed)
Ajovy 225 mg PA completed on Cover My Meds.   Key: [id number] - PA Case ID: [id number] - Rx #: [id number]  Approved immediately: 11/04/2018 - 02/03/2019  Sent pt a mychart message.

## 2018-11-04 NOTE — Telephone Encounter (Signed)
Pt returned Emily's call. Member service and precert phone # are the same: [phone]

## 2018-11-04 NOTE — Telephone Encounter (Signed)
Called the patient to inform her that we had her on the schedule for tomorrow at 10 am. Advised that I would send the email to her to get her set up on webex for the meeting. Patient verbalized understanding of being ready for the meeting 15 min prior. Pt chart is already updated from previous visit with Dr Lucia Gaskins.  I have sent a mychart message to the patient to address the sleep scale and measuring neck circumference prior to the apt.

## 2018-11-04 NOTE — Addendum Note (Signed)
Addended by: Naomie Dean B on: 11/04/2018 09:32 AM   Modules accepted: Orders

## 2018-11-04 NOTE — Addendum Note (Signed)
Addended by: Naomie Dean B on: 11/04/2018 09:01 AM   Modules accepted: Orders

## 2018-11-04 NOTE — Telephone Encounter (Signed)
lvm for pt to call me back and give me the provider # or customer service # on the back of her card for me to be able to work her MRI.

## 2018-11-05 ENCOUNTER — Other Ambulatory Visit: Payer: Self-pay

## 2018-11-05 ENCOUNTER — Encounter: Payer: Self-pay | Admitting: Neurology

## 2018-11-05 ENCOUNTER — Ambulatory Visit (INDEPENDENT_AMBULATORY_CARE_PROVIDER_SITE_OTHER): Payer: BLUE CROSS/BLUE SHIELD | Admitting: Neurology

## 2018-11-05 DIAGNOSIS — G500 Trigeminal neuralgia: Secondary | ICD-10-CM

## 2018-11-05 DIAGNOSIS — M94262 Chondromalacia, left knee: Secondary | ICD-10-CM | POA: Diagnosis not present

## 2018-11-05 DIAGNOSIS — E669 Obesity, unspecified: Secondary | ICD-10-CM

## 2018-11-05 DIAGNOSIS — E662 Morbid (severe) obesity with alveolar hypoventilation: Secondary | ICD-10-CM

## 2018-11-05 DIAGNOSIS — R0683 Snoring: Secondary | ICD-10-CM

## 2018-11-05 DIAGNOSIS — G4733 Obstructive sleep apnea (adult) (pediatric): Secondary | ICD-10-CM | POA: Insufficient documentation

## 2018-11-05 NOTE — Telephone Encounter (Signed)
Received Ajovy approval letter from [facility]. Faxed copy to pharmacy. Received a receipt of confirmation.

## 2018-11-05 NOTE — Patient Instructions (Signed)
Obesity Hypoventilation Syndrome  Obesity hypoventilation syndrome (OHS) means that you are not breathing well enough to get air in and out of your lungs efficiently (ventilation). This causes a low oxygen level and a high carbon dioxide level in your blood (hypoventilation). Having too much total body fat (obesity) is a significant risk factor for developing OHS. OHS makes it harder for your heart to pump oxygen-rich blood to your body. It can cause sleep disturbances and make you feel sleepy during the day. Over time, OHS can increase your risk for:  Heart disease.  High blood pressure (hypertension).  Reduced ability to absorb sugar from the bloodstream (insulin resistance).  Heart failure. Over time, OHS weakens your heart and can lead to heart failure. What are the causes?   Possible causes include:  Pressure on the lungs from excess body weight.  Obesity-related changes in how much air the lungs can hold (lung capacity) and how much they can expand (lung compliance).  Failure of the brain to regulate oxygen and carbon dioxide levels properly.  Chemicals (hormones) produced by excess fat cells interfering with breathing regulation.  A breathing condition in which breathing pauses or becomes shallow during sleep (sleep apnea). This condition can eventually cause the body to ventilate poorly and to hold onto carbon dioxide during the day. What increases the risk? You may have a greater risk for OHS if you:  Have a BMI of 30 or higher. BMI is an estimate of body fat that is calculated from height and weight. For adults, a BMI of 30 or higher is considered obese.  Are 97?45 years old.  Carry most of your excess weight around your waist.  Experience moderate symptoms of sleep apnea. What are the signs or symptoms? The most common symptoms of OHS are:  Daytime sleepiness.  Lack of energy.  Shortness of breath.  Morning headaches.  Sleep apnea.  Trouble concentrating.   Irritability, mood swings, or depression.  Swollen veins in the neck.  Swelling of the legs. How is this diagnosed? Your health care provider may suspect OHS if you are obese and have poor breathing during the day and at night. You may have tests to:  Measure your BMI.  Measure your blood oxygen level with a sensor placed on your finger (pulse oximetry).  Measure blood oxygen and carbon dioxide in a blood sample.  Measure the amount of red blood cells in a blood sample. OHS causes the number of red blood cells you have to increase (polycythemia).  Check your breathing ability (pulmonary function testing).  Check your breathing ability, breathing patterns, and oxygen level while you sleep (sleep study). You may also have a chest X-ray to rule out other breathing problems. You may have an electrocardiogram (ECG) and or echocardiogram to check for signs of heart failure. How is this treated? Weight loss is the most important part of treatment for OHS, and it may be the only treatment that you need. Other treatments may include:  Using a device to open your airway while you sleep, such as a continuous positive airway pressure (CPAP) machine that delivers oxygen to your airway through a mask.  Surgery (gastric bypass surgery) to lower your BMI. This may be needed if: ? You are very obese. ? Other treatments have not worked for you. ? Your OHS is very severe and is causing organ damage, such as heart failure. Follow these instructions at home: Medicines  Take over-the-counter and prescription medicines only as told by your health  care provider.  Ask your health care provider what medicines are safe for you. You may be told to avoid medicines that can impair breathing and make OHS worse, such as sedatives and narcotics. Sleeping habits  If you are prescribed a CPAP machine, make sure you understand and use the machine as directed.  Try to get 8 hours of sleep every night.  Go to bed  at the same time every night, and get up at the same time every day. General instructions  Work with your health care provider to make a diet and exercise plan that helps you reach and maintain a healthy weight.  Eat a healthy diet.  Avoid smoking.  Exercise regularly as told by your health care provider.  During the evening, do not drink caffeine and do not eat heavy meals.  Keep all follow-up visits as told by your health care provider. This is important. Contact a health care provider if:   You experience new or worsening shortness of breath.  You have chest pain.  You have an irregular heartbeat (palpitations).  You have dizziness.  You faint.  You develop a cough.  You have a fever.  You have chest pain when you breathe (pleurisy). This information is not intended to replace advice given to you by your health care provider. Make sure you discuss any questions you have with your health care provider. Document Released: 01/01/2016 Document Revised: 02/09/2016 Document Reviewed: 01/01/2016 Elsevier Interactive Patient Education  2019 [employer].

## 2018-11-05 NOTE — Progress Notes (Signed)
Virtual Visit via Video Note  I connected with Phyllis Calhoun on 11/05/18 at 10:00 AM EDT by a video enabled telemedicine application and verified that I am speaking with the correct person using two identifiers.   I discussed the limitations of evaluation and management by telemedicine and the availability of in person appointments. The patient expressed understanding and agreed to proceed.     SLEEP MEDICINE CLINIC   Provider:  Larey Seat, MD   Primary Care Physician:  London Pepper, MD   Referring Provider: Dr. Jaynee Eagles- see below .   HPI:  Phyllis Calhoun is a 43 y.o. female , seen here as in a referralfrom Dr. Jaynee Eagles.   Chief complaint according to patient : migraine, super-obesity and sleep disorder.   Phyllis Calhoun was seen in a virtual visit on 05 November 2018 by video conferencing call.  The patient is seen for a sleep consultation requested by her headache specialist, Dr. Sarina Ill, to see if a sleep disorder may be at the origin and contributing to the headache disorder.  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  She takes dinner at the time of 8 PM doing a break from work.  Continues to work until 10 PM and then has her own bedtime at 1230 sometimes it can be as late as 1 AM she rises between 830 and 9 AM.  She describes her bedroom as cool, quiet but not dark.  The TV is on in her bedroom which at 1230 or 1 AM depending on how the timer is set.  She shares a bed with her husband who also snores but complains about her snoring as well.  She reports that she dreams but not very vividly and usually not in form of nightmares.  She seems to be most often falling asleep around 1 AM and only with the help of medication will she stay asleep until 7 AM.   She feels usually refreshed and restored enough to last until after lunch.  Around lunchtime there is some fatigue and sleepiness coming on.   Sleep medical  history: The patient was diagnosed with obstructive sleep apnea in 2015 at which she recalls being a moderate degree.  She said that CPAP was not affordable to her at the time and she did not follow-up.  She is status post gamma knife in 2012 for trigeminal neuralgia, her sleep study was on November 14, 2013 in [place] at the [facility], her sleep doctor was Dr. Rock Nephew with [facility].  He also treated her headaches with topiramate and sumatriptan.  She relates that her trigeminal neuralgia began after a root canal procedure.  She has gained significant weight since 2015 sleep study.  She is describes a trigeminal neuralgia which affects her right face may be the trigger of migraines.  She listed multiple migraine medications that have failed these are also listed in Dr. Cathren Laine note.  Labs were reviewed, the patient states that her trigeminal neuralgia does somehow affect her sleep first of all a CPAP mask would interfere with the hypersensitivity of the right midface.  The pain occurs during sleep at night and lasts for some minutes, her eyes will tear her nose will run and her face gets puffy.  An MRI has been ordered for May 2020.  She has significant chondromalacia of the knees meniscus tears, and due to her super obesity she does have trouble with ongoing leg  and knee pain.    She has been on hydrocodone, meloxicam, Robaxin, Naprosyn, Topamax, Imitrex, vitamin D Tylenol and Tessalon Pearls.   Family history: are listed below under Dr. Ferdinand Lango note.    Social history: The patient is married, she shares bedroom with her husband.  She works from home and she works late hours, affecting her late bedtime and rise time in the morning.  She reports that she needs readers at night computer vision is impaired at night she does avoid to drive at night.  She has never used tobacco products, she does not deny any alcohol use, she is not endorsing caffeine use.  No children and no petsin the house.  Review  of Systems:  She has no nocturia no shortness of breath no palpitations no diaphoresis no dry mouth in the morning   Out of a complete 14 system review, the patient complains of only the following symptoms, and all other reviewed systems are negative. How likely are you to doze in the following situations: 0 = not likely, 1 = slight chance, 2 = moderate chance, 3 = high chance  Sitting and Reading? Watching Television? Sitting inactive in a public place (theater or meeting)? Lying down in the afternoon when circumstances permit? Sitting and talking to someone? Sitting quietly after lunch without alcohol? In a car, while stopped for a few minutes in traffic? As a passenger in a car for an hour without a break?  Total = 15 points.     Social History   Socioeconomic History  . Marital status: Married    Spouse name: Not on file  . Number of children: 2  . Years of education: Not on file  . Highest education level: Some college, no degree  Occupational History  . Not on file  Social Needs  . Financial resource strain: Not on file  . Food insecurity:    Worry: Not on file    Inability: Not on file  . Transportation needs:    Medical: Not on file    Non-medical: Not on file  Tobacco Use  . Smoking status: Never Smoker  . Smokeless tobacco: Never Used  Substance and Sexual Activity  . Alcohol use: No  . Drug use: No  . Sexual activity: Not on file  Lifestyle  . Physical activity:    Days per week: Not on file    Minutes per session: Not on file  . Stress: Not on file  Relationships  . Social connections:    Talks on phone: Not on file    Gets together: Not on file    Attends religious service: Not on file    Active member of club or organization: Not on file    Attends meetings of clubs or organizations: Not on file    Relationship status: Not on file  . Intimate partner violence:    Fear of current or ex partner: Not on file    Emotionally abused: Not on file     Physically abused: Not on file    Forced sexual activity: Not on file  Other Topics Concern  . Not on file  Social History Narrative   Lives at home with her husband, parents, and her son   Right handed   Caffeine: mostly water, tea here and there    Family History  Problem Relation Age of Onset  . Migraines Neg Hx     Past Medical History:  Diagnosis Date  . Arthritis    both  legs  . Gallstones    HX OF SEVERAL GALLBLADDER ATTACKS  . Headache(784.0)    PT STATES MIGRAINES FROM TRIGEMINAL NEURALGIA  . Sleep apnea    sleep study confirmed  . Trigeminal neuralgia    2012-PT CONTINUES TO HAVE PAIN RIGHT SIDE OF FACE    Past Surgical History:  Procedure Laterality Date  . ABDOMINAL HYSTERECTOMY    . BREAST REDUCTION SURGERY    . CHOLECYSTECTOMY N/A 05/25/2013   Procedure: LAPAROSCOPIC CHOLECYSTECTOMY WITH INTRAOPERATIVE CHOLANGIOGRAM;  Surgeon: Ralene Ok, MD;  Location: [facility];  Service: General;  Laterality: N/A;  . KNEE ARTHROSCOPY Left 12/29/2015   Procedure: ARTHROSCOPY KNEE;  Surgeon: Dorna Leitz, MD;  Location: [place];  Service: Orthopedics;  Laterality: Left;  . TONSILLECTOMY      Current Outpatient Medications  Medication Sig Dispense Refill  . acetaminophen (TYLENOL) 500 MG tablet Take 1,000 mg by mouth every 6 (six) hours as needed for pain. Pain.    . benzonatate (TESSALON) 100 MG capsule Take 1 capsule (100 mg total) by mouth every 8 (eight) hours. (Patient not taking: Reported on 10/23/2016) 21 capsule 0  . diclofenac sodium (VOLTAREN) 1 % GEL APPLY 4 GRAMS 2 TO 3 TIMES DAILY AS NEEDED    . Fremanezumab-vfrm (AJOVY) 225 MG/1.5ML SOSY Inject 225 mg into the skin every 30 (thirty) days. 1 Syringe 11  . HYDROcodone-acetaminophen (NORCO) 10-325 MG tablet Take 1 tablet by mouth every 6 (six) hours as needed for moderate pain.    . meloxicam (MOBIC) 15 MG tablet Take 15 mg by mouth daily.    . methocarbamol (ROBAXIN) 750 MG tablet Take 750 mg by mouth every 8  (eight) hours as needed for muscle spasms.  1  . naproxen (NAPROSYN) 500 MG tablet Take 1 tablet (500 mg total) by mouth 2 (two) times daily. (Patient not taking: Reported on 10/23/2016) 30 tablet 0  . phentermine (ADIPEX-P) 37.5 MG tablet Take 37.5 mg by mouth daily.    . SUMAtriptan (IMITREX) 100 MG tablet Take 100 mg by mouth every 2 (two) hours as needed for migraine or headache. May repeat in 2 hours if headache persists or recurs.    . topiramate (TOPAMAX) 100 MG tablet Take 1 tablet (100 mg total) by mouth 2 (two) times daily. 60 tablet 6  . Vitamin D, Ergocalciferol, (DRISDOL) 1.25 MG (50000 UT) CAPS capsule Take 50,000 Units by mouth once a week.     No current facility-administered medications for this visit.     Allergies as of 11/05/2018  . (Not on File)    Vitals: There were no vitals taken for this visit.    Last Weight:   Wt Readings from Last 1 Encounters:  11/02/18 (!) 400 lb (181.4 kg)   JJK:KXFGH is no height or weight on file to calculate BMI.       Last Height:   Ht Readings from Last 1 Encounters:  11/02/18 5' 8"  (1.727 m)    Physical/ observational exam:  General: The patient is awake, alert and appears not in acute distress. The patient is well groomed. Head: Normocephalic, atraumatic. Neck is supple. Mallampati 4,  neck circumference:17  Nasal airflow patent ,Retrognathia is seen.  Trunk: BMI is 60+ Neurologic exam : The patient is awake and alert, oriented to place and time.    Attention span & concentration ability appears normal.  Speech is fluent,  without dysarthria, dysphonia or aphasia.  Mood and affect are appropriate.  Cranial nerves: Pupils are equal and  briskly reactive to light.  Extraocular movements  in vertical and horizontal planes intact and without nystagmus.  Facial motor strength is symmetric and tongue and uvula move midline. Shoulder shrug was symmetrical.   Motor exam:  Tone, muscle bulk and strength appear symmetric.   Assessment:     Dear Dr. Jaynee Eagles, Dear Dr. Orland Mustard.  Thank you for sharing this patient with me, she is definitely due to her overbite and retrognathia already anatomically predisposed to have sleep apnea.  In addition in correlation with her super obese body mass index she has also a higher neck circumference and a very narrow upper airway.  The loud snoring that her husband reported is likely a manifestation of apnea.  One problem that he may have is that she has trigeminal neuralgia and treatment with CPAP may cause her to have more pain or dysesthesia in the right mid face, her main localization for trigeminal neuralgia.  I will order a home sleep test as our lab is currently close due to the COVID-19 crisis.  She endorsed an excessive degree of daytime sleepiness that warrants not to wait with this sleep assessment until the lab will be opened.  I explained to her that we will likely refurbish her with an auto titration CPAP should she test positive again for obstructive sleep apnea.  There may be a possibility of finding a mask that avoids touching the right cheekbone.  She is interested in proceeding.   I discussed the assessment and treatment plan with the patient. The patient was provided an opportunity to ask questions and all were answered. The patient agreed with the plan and demonstrated an understanding of the instructions.   The patient was advised to call back or seek an in-person evaluation if the symptoms worsen or if the condition fails to improve as anticipated.  I provided 34 minutes of non-face-to-face time during this encounter.  Larey Seat, MD      PS- here are the referring notes from Dr. Jaynee Eagles.   Melvenia Beam, MD [phone]  HPI:  Krystin Keeven is a 43 y.o. female patient for whom her PCP requested , London Pepper, MD. Requested an evaluation for migraines. PMHx morbid obesity, insomnia, migraine, trigeminal neuralgia status post gamma knife. She is on  Topiramate 50 mg twice daily and sumatriptan.  Patient has obstructive sleep apnea and is untreated.  Her trigeminal neuralgia began in 2011 after a root canal procedure.   She is aware that she is overweight and attributes her trigeminal neuralgia to weight gain since 2011.   Although she realizes she did not lose weight in the last year even though her trigeminal neuralgia was under control.  She also realizes she has been overweight for a long time.  She tried topiramate and when she got to 100 mg she had side effects dizziness became numb.   She has 2 to 3 days weeks with the headaches.  She misses work about 7 to 8 days because of her headaches.   She also frequently overuses her medications over-the-counter per past medical records and developed medication overuse headache. There is lots of stress and that makes things worse. The trigeminal neuralgia is worse and it flares the migraines. She wakes with migraines in the morning. Her trigeminal neuralgia is every day, triggers are touching her face or chewing, it is severe and brief, In all 3 branches of the trigeminal nerve. She is on Topiramate twice daily. Her migraines are triggered by irritation, stress, trigeminal  neuralgia flair. They start at the back of the head, like someone standing on her shoulder, she has light and sound sensitivity, nausea, she can;t concentrate, laying down helps and she can;t function. She takes hydrocodone 4x a day and is aware these can worsen headaches but no ibuprofen, tylenol or goody powders. She takes Meloxicam.    Migraine medications tried: Topiramate, Robaxin, meloxicam, verapamil, gamma knife, occipital nerve blocks, renal palate team ganglion blocks, ketamine infusions, carbamazepine, gabapentin, Botox, Effexor, Butrans patches.  She had gamma knife procedure in 2011, occipital blocks, transnasal sphenopalatine nerve blocks and 3 ketamine infusions.  She is also taken ibuprofen, Tylenol and prior physicians  concern for medication overuse headaches she does not take caffeine..  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  Reviewed lab work.  Labs were taken September 12, 2018.  BUN 10, creatinine 0.87, c-Met normal, CBC normal, TSH 1.52.   Patient's had trigeminal neuralgia and is status post gamma knife.  She had a possible vasovagal episode in the past.  Migraines and trigeminal neuralgia.  She was seen for morbid obesity and is followed with bariatric surgery in hopes of future weight loss surgery.  She has had a few syncopal episodes in the setting of migraine.  She was laying in bed was getting up to go to the bathroom and must have fainted.  She had noted a migraine all day.  Has followed with neurology for migraines and trigeminal neuralgia.  She also stopped going to pain medicine and has been off pain medicines since October.  She also takes Tylenol.  She has been on verapamil in the past, meloxicam, hydrocodone, Robaxin.  She feels her trigeminal neuralgia is worse, the pain is constant but hurts if she touches her lip, the pain feels like a bad toothache, like an irritating aching pain.  She has sharp pains that run through her mouth going crazy in the entire right side of her head starts to hurt and she feels it sets off her headaches.  She denies ipsilateral autonomic features with her headache.  Severe pain 1 to 2 minutes and goes back and forth from the bottom nerve to the top nerve, the entire right side is shooting pain.  Worsening.  Personally reviewed CT of the head images completed in February 2015 for frontal headache, dizziness, right upper and lower extremity numbness:   Ct showed No acute intracranial abnormalities including mass lesion or mass effect, hydrocephalus, extra-axial fluid collection, midline shift,  hemorrhage, or acute infarction, large ischemic events (personally reviewed images)    Larey Seat, MD

## 2018-11-09 ENCOUNTER — Telehealth: Payer: Self-pay | Admitting: Neurology

## 2018-11-09 NOTE — Telephone Encounter (Signed)
LVM to schedule a 3 month follow-up with NP per Dr. Vickey Huger.

## 2018-11-16 ENCOUNTER — Ambulatory Visit (INDEPENDENT_AMBULATORY_CARE_PROVIDER_SITE_OTHER): Payer: BLUE CROSS/BLUE SHIELD | Admitting: Neurology

## 2018-11-16 DIAGNOSIS — E669 Obesity, unspecified: Secondary | ICD-10-CM

## 2018-11-16 DIAGNOSIS — M94262 Chondromalacia, left knee: Secondary | ICD-10-CM

## 2018-11-16 DIAGNOSIS — G4733 Obstructive sleep apnea (adult) (pediatric): Secondary | ICD-10-CM | POA: Diagnosis not present

## 2018-11-16 DIAGNOSIS — G500 Trigeminal neuralgia: Secondary | ICD-10-CM

## 2018-11-16 DIAGNOSIS — E662 Morbid (severe) obesity with alveolar hypoventilation: Secondary | ICD-10-CM

## 2018-11-16 DIAGNOSIS — R0683 Snoring: Secondary | ICD-10-CM

## 2018-11-16 DIAGNOSIS — G4719 Other hypersomnia: Secondary | ICD-10-CM

## 2018-11-17 ENCOUNTER — Other Ambulatory Visit: Payer: Self-pay

## 2018-12-02 ENCOUNTER — Telehealth: Payer: Self-pay | Admitting: Neurology

## 2018-12-02 NOTE — Telephone Encounter (Signed)
-----   Message from Melvyn Novas, MD sent at 12/02/2018 12:48 PM EDT ----- surprisingly mild degree of overall apnea ,with loud snoring and REM accentuation. I recommend  CPAP autotitration and medical weight loss program to improve sleep and EDS.   I ordered an auto titration capable CPAP - 5-12 cm water, 2 cm EPR and mask of patients choice, use with heated humidification.

## 2018-12-02 NOTE — Procedures (Signed)
Patient Information     First Name: Phyllis SchleinFelicia Last Name: Barry Calhoun ID: [id number]  Birth Date: Apr 02, 1976 Age: [age] Gender: Female  Referring Provider: Naomie DeanAntonia Ahern, MD BMI: 59.9 (W=381 lb, H=5' 7'')  Neck Circ.: 17 '' Epworth:  15/24   Sleep Study Information    Study Date: Nov 23, 2018 S/H/A Version: 5.1.76.4 / 4.1.1531 / 5476  History: [name] was seen in a virtual visit on 05 November 2018 by video conferencing call.  The patient is seen for a sleep consultation requested by her headache specialist, Dr. Naomie DeanAntonia Ahern, to see if a sleep disorder may be at the origin and contributing to the headache disorder. The patient is considered excessively daytime sleepy.  She stay asleep until 7 AM.  She feels usually refreshed and restored enough to last until after lunch.  Around lunchtime there is some fatigue and sleepiness coming on. 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      Summary & Diagnosis:        Only 5.5 hours of sleep time were recorded, all in supine position. The general AHI was low at 6.4/h - indicating overall Sleep Apnea only to be present in REM  sleep, REM AHI was 20.2/h.   Recommendations:      There is REM deoendent sleep apnea present, but the mild degree overall makes CPAP use optional.  The patient can try CPAP , but could also undergo rigorous medical weight management to improve the AHI. Electronically Signed: Melvyn Novasarmen Karima Carrell, MD   12-01-2018                   Sleep Summary  Oxygen Saturation Statistics   Start Study Time: End Study Time: Total Recording Time:  1:40:24 AM 8:04:35 AM 6 h, 24 min  Total Sleep Time % REM of Sleep Time:  5 h, 30 min  31.8    Mean:   94  Minimum:   84  Maximum:   98   Mean of Desaturations Nadirs (%): 90    Oxygen Desaturation. %:   4-9 10-20 >20 Total  Events Number Total    20  1 95.2 4.8  0 0.0  21 100.0  Oxygen Saturation: <90 <=88 <85 <80 <70  Duration (minutes): Sleep % 4.3 1.3  2.6  0.1  0.8 0.0 0.0 0.0 0.0 0.0     Respiratory Indices      Total Events REM NREM All Night  pRDI:  36  pAHI:  35 ODI:  21  pAHIc:  0  % CSR: 0.0 20.7 20.2 12.1 0.0 0.0 0.0 0.0 0.0 6.6 6.4 3.8 0.0       Pulse Rate Statistics during Sleep (BPM)      Mean: 86 Minimum: 69 Maximum: 104    Indices are calculated using technically valid sleep time of  5 hrs, 29 min. pRDI/pAHI are calculated using oxi desaturations ? 3%   Body Position Statistics  Position Supine Prone Right Left Non-Supine  Sleep (min) 330.2 0.0 0.0 0.0 0.0  Sleep % 100.0 0.0 0.0 0.0 0.0  pRDI 6.6 N/A N/A N/A N/A  pAHI 6.4 N/A N/A N/A N/A  ODI 3.8 N/A N/A N/A N/A     Snoring Statistics Snoring Level (dB) >40 >50 >60 >70 >80 >Threshold (45)  Sleep (min) 44.4 1.6 0.3 0.0 0.0 9.3  Sleep % 13.4 0.5 0.1 0.0 0.0 2.8    Mean: 40 dB Sleep Stages Chart   * Reference values are according  to AASM guidelines

## 2018-12-02 NOTE — Telephone Encounter (Signed)
Called patient to discuss sleep study results. No answer at this time. LVM for the patient to call back.   

## 2018-12-02 NOTE — Telephone Encounter (Signed)
Pt called back and I was able to review the sleep study and informed her that the apnea was relatively mild. It increased in her rem stage. Advised the pt that Dr Vickey Huger recommends weight loss and attempting a auto CPAP. The cpap was optional for the patient due to the level of apnea being mild. Patient is in process of working with MD to help with weight loss and is hopeful this will help. Advised the patient that after weightloss if she is still waking up with headaches or feeling fatigue we could offer the cpap at that time. Pt verbalized understanding and will call if she wants to move forward with that option. Pt verbalized understanding. Pt had no questions at this time but was encouraged to call back if questions arise.

## 2018-12-02 NOTE — Addendum Note (Signed)
Addended by: Melvyn Novas on: 12/02/2018 12:48 PM   Modules accepted: Orders

## 2018-12-29 NOTE — Telephone Encounter (Signed)
I called the phone number [phone] again and selected the authorization line and I got [employer] they informed me no authorization is require Ref # Phyllis Calhoun. Patient is scheduled at GI for 12/30/18.

## 2018-12-30 ENCOUNTER — Other Ambulatory Visit: Payer: 59

## 2019-01-27 ENCOUNTER — Other Ambulatory Visit: Payer: 59

## 2019-02-04 ENCOUNTER — Telehealth: Payer: Self-pay | Admitting: Neurology

## 2019-02-04 NOTE — Telephone Encounter (Signed)
PA completed on cover my meds/[facility] [employer]. Will wait for response. [id number]

## 2019-02-04 NOTE — Telephone Encounter (Signed)
Approved from 02/04/2019 - 02/04/2020 through [facility] [employer]. PA# [employer] [id number] VS

## 2019-02-10 ENCOUNTER — Other Ambulatory Visit: Payer: Self-pay | Admitting: Physician Assistant

## 2019-02-10 DIAGNOSIS — Z1231 Encounter for screening mammogram for malignant neoplasm of breast: Secondary | ICD-10-CM

## 2019-02-22 NOTE — Telephone Encounter (Signed)
Printed approval letter from [facility]. Faxed letter to [facility]. Received a receipt of confirmation.

## 2019-05-03 ENCOUNTER — Other Ambulatory Visit: Payer: Self-pay | Admitting: Neurology

## 2019-05-04 NOTE — Telephone Encounter (Signed)
I sent pt a mychart message about calling to schedule a follow up appt with Amy NP before 07/2019. We are currently refilling two medications for her.

## 2019-05-25 NOTE — Progress Notes (Deleted)
PATIENT: Phyllis Calhoun DOB: Mar 02, 1976  REASON FOR VISIT: follow up HISTORY FROM: patient  No chief complaint on file.    HISTORY OF PRESENT ILLNESS: Today 55/73/22 Phyllis Calhoun is a 43 y.o. female here today for follow up for concerns of trigeminal neuralgia. She is s/p gamma knife procedure that did help in 2011. She has been on multiple medications including topiramate, Robaxin, meloxicam, verapamil, nerve blocks, renal palate ganglion blocks, ketamine infusions, carbamazepine, gabapentin, Botox, Effexor, and Butrans patches. She has mild OSA (REM dependent) but opted for medical weight management to avoid CPAP. She is currently taking topiramate 132m twice daily.    HISTORY: (copied from Dr ACathren Lainenote on 30/25/4270  HPI:  Phyllis Peronais a 43y.o. female here as requested by MLondon Pepper MD for migraines. PMHx morbid obesity, insomnia, migraine, trigeminal neuralgia status post gamma knife. She is on Topiramate 50 mg twice daily and sumatriptan.  Patient has obstructive sleep apnea and is untreated.  Her trigeminal neuralgia began in 2011 after a root canal procedure.  She is aware that she is overweight and attributes her trigeminal neuralgia to weight gain since 2011.  Although she realizes she did not lose weight in the last year even though her trigeminal neuralgia was under control.  She also realizes she has been overweight for a long time.  She tried topiramate and when she got to 100 mg she had side effects dizziness became numb.  She has 2 to 3 days weeks with the headaches.  She misses work about 7 to 8 days because of her headaches.  She also frequently overuses her medications over-the-counter per past medical records and developed medication overuse headache. There is lots of stress and that makes things worse. The trigeminal neuralgia is worse and it flares the migraines. She wakes with migraines in the morning. Her trigeminal neuralgia is every  day, triggers are touching her face or chewing, it is severe and brief, In all 3 branches of the trigeminal nerve. She is on Topiramate twice daily. Her migraines are triggered by irritation, stress, trigeminal neuralgia flair. They start at the back of the head, like someone standing on her shoulder, she has light and sound sensitivity, nausea, she can;t concentrate, laying down helps and she can;t function. She takes hydrocodone 4x a day and is aware these can worsen headaches but no ibuprofen, tylenol or goody powders. She takes Meloxicam.    Migraine medications tried: Topiramate, Robaxin, meloxicam, verapamil, gamma knife, occipital nerve blocks, renal palate team ganglion blocks, ketamine infusions, carbamazepine, gabapentin, Botox, Effexor, Butrans patches.  She had gamma knife procedure in 2011, occipital blocks, transnasal sphenopalatine nerve blocks and 3 ketamine infusions.  She is also taken ibuprofen, Tylenol and prior physicians concern for medication overuse headaches she does not take caffeine..  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  Reviewed lab work.  Labs were taken September 12, 2018.  BUN 10, creatinine 0.87, c-Met normal, CBC normal, TSH 1.52.   Patient's had trigeminal neuralgia status post gamma knife.  She had a possible vasovagal episode in the past.  Migraines and trigeminal neuralgia.  She was seen for morbid obesity and is followed with bariatric surgery in hopes of future weight loss surgery.  She has had a few syncopal episodes in the setting of migraine.  She was laying in bed was getting up to go to the bathroom and must have fainted.  She had noted a migraine all day.  Has followed with neurology for migraines and trigeminal neuralgia.  She also stopped going to pain medicine and has  been off pain medicines since October.  She also takes Tylenol.  She has been on verapamil in the past, meloxicam, hydrocodone, Robaxin.  She feels her trigeminal neuralgia is worse, the pain is constant but hurts if she touches her lip, the pain feels like a bad toothache, like an irritating aching pain.  She has sharp pains that run through her mouth going crazy in the entire right side of her head starts to hurt and she feels it sets off her headaches.  She denies ipsilateral autonomic features with her headache.  Severe pain 1 to 2 minutes and goes back and forth from the bottom nerve to the top nerve, the entire right side is shooting pain.  Worsening.  Personally reviewed CT of the head images completed in February 2015 for frontal headache, dizziness, right upper and lower extremity numbness:   Ct showed No acute intracranial abnormalities including mass lesion or mass effect, hydrocephalus, extra-axial fluid collection, midline shift, hemorrhage, or acute infarction, large ischemic events (personally reviewed images)   REVIEW OF SYSTEMS: Out of a complete 14 system review of symptoms, the patient complains only of the following symptoms, and all other reviewed systems are negative.  ALLERGIES: Not on File  HOME MEDICATIONS: Outpatient Medications Prior to Visit  Medication Sig Dispense Refill   acetaminophen (TYLENOL) 500 MG tablet Take 1,000 mg by mouth every 6 (six) hours as needed for pain. Pain.     benzonatate (TESSALON) 100 MG capsule Take 1 capsule (100 mg total) by mouth every 8 (eight) hours. (Patient not taking: Reported on 10/23/2016) 21 capsule 0   diclofenac sodium (VOLTAREN) 1 % GEL APPLY 4 GRAMS 2 TO 3 TIMES DAILY AS NEEDED     Fremanezumab-vfrm (AJOVY) 225 MG/1.5ML SOSY Inject 225 mg into the skin every 30 (thirty) days. 1 Syringe 11   HYDROcodone-acetaminophen (NORCO) 10-325 MG tablet Take 1 tablet by mouth every 6 (six) hours as needed for moderate pain.      meloxicam (MOBIC) 15 MG tablet Take 15 mg by mouth daily.     methocarbamol (ROBAXIN) 750 MG tablet Take 750 mg by mouth every 8 (eight) hours as needed for muscle spasms.  1   naproxen (NAPROSYN) 500 MG tablet Take 1 tablet (500 mg total) by mouth 2 (two) times daily. (Patient not taking: Reported on 10/23/2016) 30 tablet 0   phentermine (ADIPEX-P) 37.5 MG tablet Take 37.5 mg by mouth daily.     SUMAtriptan (IMITREX) 100 MG tablet Take 100 mg by mouth every 2 (two) hours as needed for migraine or headache. May repeat in 2 hours if headache persists or recurs.     topiramate (TOPAMAX) 100 MG tablet TAKE 1 TABLET BY MOUTH TWICE A DAY 180 tablet 2   Vitamin D, Ergocalciferol, (DRISDOL) 1.25 MG (50000 UT) CAPS capsule Take 50,000 Units by mouth once a week.     No facility-administered medications prior to visit.     PAST MEDICAL HISTORY: Past Medical History:  Diagnosis Date   Arthritis    both legs   Gallstones    HX OF SEVERAL GALLBLADDER ATTACKS   Headache(784.0)    PT STATES MIGRAINES FROM TRIGEMINAL NEURALGIA   Sleep apnea    sleep study confirmed  Trigeminal neuralgia    2012-PT CONTINUES TO HAVE PAIN RIGHT SIDE OF FACE    PAST SURGICAL HISTORY: Past Surgical History:  Procedure Laterality Date   ABDOMINAL HYSTERECTOMY     BREAST REDUCTION SURGERY     CHOLECYSTECTOMY N/A 05/25/2013   Procedure: LAPAROSCOPIC CHOLECYSTECTOMY WITH INTRAOPERATIVE CHOLANGIOGRAM;  Surgeon: Ralene Ok, MD;  Location: [facility];  Service: General;  Laterality: N/A;   KNEE ARTHROSCOPY Left 12/29/2015   Procedure: ARTHROSCOPY KNEE;  Surgeon: Dorna Leitz, MD;  Location: [facility];  Service: Orthopedics;  Laterality: Left;   TONSILLECTOMY      FAMILY HISTORY: Family History  Problem Relation Age of Onset   Migraines Neg Hx     SOCIAL HISTORY: Social History   Socioeconomic History   Marital status: Married    Spouse name: Not on file   Number of children: 2   Years of  education: Not on file   Highest education level: Some college, no degree  Occupational History   Not on file  Social Needs   Financial resource strain: Not on file   Food insecurity    Worry: Not on file    Inability: Not on file   Transportation needs    Medical: Not on file    Non-medical: Not on file  Tobacco Use   Smoking status: Never Smoker   Smokeless tobacco: Never Used  Substance and Sexual Activity   Alcohol use: No   Drug use: No   Sexual activity: Not on file  Lifestyle   Physical activity    Days per week: Not on file    Minutes per session: Not on file   Stress: Not on file  Relationships   Social connections    Talks on phone: Not on file    Gets together: Not on file    Attends religious service: Not on file    Active member of club or organization: Not on file    Attends meetings of clubs or organizations: Not on file    Relationship status: Not on file   Intimate partner violence    Fear of current or ex partner: Not on file    Emotionally abused: Not on file    Physically abused: Not on file    Forced sexual activity: Not on file  Other Topics Concern   Not on file  Social History Narrative   Lives at home with her husband, parents, and her son   Right handed   Caffeine: mostly water, tea here and there      PHYSICAL EXAM  There were no vitals filed for this visit. There is no height or weight on file to calculate BMI.  Generalized: Well developed, in no acute distress  Cardiology: normal rate and rhythm, no murmur noted Neurological examination  Mentation: Alert oriented to time, place, history taking. Follows all commands speech and language fluent Cranial nerve II-XII: Pupils were equal round reactive to light. Extraocular movements were full, visual field were full on confrontational test. Facial sensation and strength were normal. Uvula tongue midline. Head turning and shoulder shrug  were normal and symmetric. Motor:  The motor testing reveals 5 over 5 strength of all 4 extremities. Good symmetric motor tone is noted throughout.  Sensory: Sensory testing is intact to soft touch on all 4 extremities. No evidence of extinction is noted.  Coordination: Cerebellar testing reveals good finger-nose-finger and heel-to-shin bilaterally.  Gait and station: Gait is normal. Tandem gait is normal. Romberg is negative. No drift  is seen.  Reflexes: Deep tendon reflexes are symmetric and normal bilaterally.   DIAGNOSTIC DATA (LABS, IMAGING, TESTING) - I reviewed patient records, labs, notes, testing and imaging myself where available.  No flowsheet data found.   Lab Results  Component Value Date   WBC 11.8 (H) 10/23/2016   HGB 11.6 (L) 10/23/2016   HCT 37.6 10/23/2016   MCV 82.8 10/23/2016   PLT 321 10/23/2016      Component Value Date/Time   NA 138 10/23/2016 1645   K 4.4 10/23/2016 1645   CL 104 10/23/2016 1645   CO2 25 10/23/2016 1645   GLUCOSE 90 10/23/2016 1645   BUN 9 10/23/2016 1645   CREATININE 0.73 10/23/2016 1645   CALCIUM 9.0 10/23/2016 1645   PROT 6.2 (L) 01/21/2016 0241   ALBUMIN 3.0 (L) 01/21/2016 0241   AST 18 01/21/2016 0241   ALT 9 (L) 01/21/2016 0241   ALKPHOS 77 01/21/2016 0241   BILITOT 0.4 01/21/2016 0241   GFRNONAA >60 10/23/2016 1645   GFRAA >60 10/23/2016 1645   No results found for: CHOL, HDL, LDLCALC, LDLDIRECT, TRIG, CHOLHDL No results found for: HGBA1C No results found for: VITAMINB12 No results found for: TSH     ASSESSMENT AND PLAN 43 y.o. year old female  has a past medical history of Arthritis, Gallstones, Headache(784.0), Sleep apnea, and Trigeminal neuralgia. here with     ICD-10-CM   1. Trigeminal neuralgia of right side of face  G50.0   2. OSA (obstructive sleep apnea)  G47.33        No orders of the defined types were placed in this encounter.    No orders of the defined types were placed in this encounter.     I spent 15 minutes with the  patient. 50% of this time was spent counseling and educating patient on plan of care and medications.    Debbora Presto, FNP-C 05/25/2019, 1:11 PM [facility] [address], [place], [place] [address] [phone]

## 2019-05-25 NOTE — Patient Instructions (Signed)
Living With Sleep Apnea Sleep apnea is a condition in which breathing pauses or becomes shallow during sleep. Sleep apnea is most commonly caused by a collapsed or blocked airway. People with sleep apnea snore loudly and have times when they gasp and stop breathing for 10 seconds or more during sleep. This happens over and over during the night. This disrupts your sleep and keeps your body from getting the rest that it needs, which can cause tiredness and lack of energy (fatigue) during the day. The breaks in breathing also interrupt the deep sleep that you need to feel rested. Even if you do not completely wake up from the gaps in breathing, your sleep may not be restful. You may also have a headache in the morning and low energy during the day, and you may feel anxious or depressed. How can sleep apnea affect me? Sleep apnea increases your chances of extreme tiredness during the day (daytime fatigue). It can also increase your risk for health conditions, such as:  Heart attack.  Stroke.  Diabetes.  Heart failure.  Irregular heartbeat.  High blood pressure. If you have daytime fatigue as a result of sleep apnea, you may be more likely to:  Perform poorly at school or work.  Fall asleep while driving.  Have difficulty with attention.  Develop depression or anxiety.  Become severely overweight (obese).  Have sexual dysfunction. What actions can I take to manage sleep apnea? Sleep apnea treatment   If you were given a device to open your airway while you sleep, use it only as told by your health care provider. You may be given: ? An oral appliance. This is a custom-made mouthpiece that shifts your lower jaw forward. ? A continuous positive airway pressure (CPAP) device. This device blows air through a mask when you breathe out (exhale). ? A nasal expiratory positive airway pressure (EPAP) device. This device has valves that you put into each nostril. ? A bi-level positive  airway pressure (BPAP) device. This device blows air through a mask when you breathe in (inhale) and breathe out (exhale).  You may need surgery if other treatments do not work for you. Sleep habits  Go to sleep and wake up at the same time every day. This helps set your internal clock (circadian rhythm) for sleeping. ? If you stay up later than usual, such as on weekends, try to get up in the morning within 2 hours of your normal wake time.  Try to get at least 7-9 hours of sleep each night.  Stop computer, tablet, and mobile phone use a few hours before bedtime.  Do not take long naps during the day. If you nap, limit it to 30 minutes.  Have a relaxing bedtime routine. Reading or listening to music may relax you and help you sleep.  Use your bedroom only for sleep. ? Keep your television and computer out of your bedroom. ? Keep your bedroom cool, dark, and quiet. ? Use a supportive mattress and pillows.  Follow your health care provider's instructions for other changes to sleep habits. Nutrition  Do not eat heavy meals in the evening.  Do not have caffeine in the later part of the day. The effects of caffeine can last for more than 5 hours.  Follow your health care provider's or dietitian's instructions for any diet changes. Lifestyle      Do not drink alcohol before bedtime. Alcohol can cause you to fall asleep at first, but then  it can cause you to wake up in the middle of the night and have trouble getting back to sleep.  Do not use any products that contain nicotine or tobacco, such as cigarettes and e-cigarettes. If you need help quitting, ask your health care provider. Medicines  Take over-the-counter and prescription medicines only as told by your health care provider.  Do not use over-the-counter sleep medicine. You can become dependent on this medicine, and it can make sleep apnea worse.  Do not use medicines, such as sedatives and narcotics, unless told by your  health care provider. Activity  Exercise on most days, but avoid exercising in the evening. Exercising near bedtime can interfere with sleeping.  If possible, spend time outside every day. Natural light helps regulate your circadian rhythm. General information  Lose weight if you need to, and maintain a healthy weight.  Keep all follow-up visits as told by your health care provider. This is important.  If you are having surgery, make sure to tell your health care provider that you have sleep apnea. You may need to bring your device with you. Where to find more information Learn more about sleep apnea and daytime fatigue from:  American Sleep Association: sleepassociation.org  National Sleep Foundation: sleepfoundation.org  National Heart, Lung, and Blood Institute: [facility].glnhlbi.nih.gov Summary  Sleep apnea can cause daytime fatigue and other serious health conditions.  Both sleep apnea and daytime fatigue can be bad for your health and well-being.  You may need to wear a device while sleeping to help keep your airway open.  If you are having surgery, make sure to tell your health care provider that you have sleep apnea. You may need to bring your device with you.  Making changes to sleep habits, diet, lifestyle, and activity can help you manage sleep apnea. This information is not intended to replace advice given to you by your health care provider. Make sure you discuss any questions you have with your health care provider. Document Released: 10/16/2017 Document Revised: 11/13/2018 Document Reviewed: 10/16/2017 Elsevier Patient Education  2020 Elsevier Inc.   Trigeminal Neuralgia  Trigeminal neuralgia is a nerve disorder that causes severe pain on one side of the face. The pain may last from a few seconds to several minutes. The pain is usually only on one side of the face. Symptoms may occur for days, weeks, or months and then go away for months or years. The pain may return and  be worse than before. What are the causes? This condition is caused by damage or pressure to a nerve in the head that is called the trigeminal nerve. An attack can be triggered by:  Talking.  Chewing.  Putting on makeup.  Washing your face.  Shaving your face.  Brushing your teeth.  Touching your face. What increases the risk? You are more likely to develop this condition if you:  Are 45 years of age or older.  Are female. What are the signs or symptoms? The main symptom of this condition is severe pain in the:  Jaw.  Lips.  Eyes.  Nose.  Scalp.  Forehead.  Face. The pain may be:  Intense.  Stabbing.  Electric.  Shock-like. How is this diagnosed? This condition is diagnosed with a physical exam. A CT scan or an MRI may be done to rule out other conditions that can cause facial pain. How is this treated? This condition may be treated with:  Avoiding the things that trigger your symptoms.  Taking prescription medicines (  anticonvulsants).  Having surgery. This may be done in severe cases if other medical treatment does not provide relief.  Having procedures such as ablation, thermal, or radiation therapy. It may take up to one month for treatment to start relieving the pain. Follow these instructions at home: Managing pain  Learn as much as you can about how to manage your pain. Ask your health care provider if a pain specialist would be helpful.  Consider talking with a mental health care provider (psychologist) about how to cope with the pain.  Consider joining a pain support group. General instructions  Take over-the-counter and prescription medicines only as told by your health care provider.  Avoid the things that trigger your symptoms. It may help to: ? Chew on the unaffected side of your mouth. ? Avoid touching your face. ? Avoid blasts of hot or cold air.  Follow your treatment plan as told by your health care provider. This may  include: ? Cognitive or behavioral therapy. ? Gentle, regular exercise. ? Meditation or yoga. ? Aromatherapy.  Keep all follow-up visits as told by your health care provider. You may need to be monitored closely to make sure treatment is working well for you. Where to find more information  Facial Pain Association: fpa-support.org Contact a health care provider if:  Your medicine is not helping your symptoms.  You have side effects from the medicine used for treatment.  You develop new, unexplained symptoms, such as: ? Double vision. ? Facial weakness. ? Facial numbness. ? Changes in hearing or balance.  You feel depressed. Get help right away if:  Your pain is severe and is not getting better.  You develop suicidal thoughts. If you ever feel like you may hurt yourself or others, or have thoughts about taking your own life, get help right away. You can go to your nearest emergency department or call:  Your local emergency services (911 in the [place]  A suicide crisis helpline, such as the [place] at 512-632-9669. This is open 24 hours a day. Summary  Trigeminal neuralgia is a nerve disorder that causes severe pain on one side of the face. The pain may last from a few seconds to several minutes.  This condition is caused by damage or pressure to a nerve in the head that is called the trigeminal nerve.  Treatment may include avoiding the things that trigger your symptoms, taking medicines, or having surgery or procedures. It may take up to one month for treatment to start relieving the pain.  Avoid the things that trigger your symptoms.  Keep all follow-up visits as told by your health care provider. You may need to be monitored closely to make sure treatment is working well for you. This information is not intended to replace advice given to you by your health care provider. Make sure you discuss any questions you have with your health care  provider. Document Released: 07/19/2000 Document Revised: 06/08/2018 Document Reviewed: 06/08/2018 Elsevier Patient Education  2020 [employer].

## 2019-05-26 ENCOUNTER — Telehealth: Payer: Self-pay | Admitting: Family Medicine

## 2019-05-26 ENCOUNTER — Other Ambulatory Visit: Payer: Self-pay

## 2019-05-26 ENCOUNTER — Ambulatory Visit (INDEPENDENT_AMBULATORY_CARE_PROVIDER_SITE_OTHER): Payer: BC Managed Care – PPO | Admitting: Family Medicine

## 2019-05-26 ENCOUNTER — Encounter: Payer: Self-pay | Admitting: Family Medicine

## 2019-05-26 VITALS — BP 130/94 | HR 84 | Temp 97.6°F | Ht 68.0 in | Wt 384.0 lb

## 2019-05-26 DIAGNOSIS — G4733 Obstructive sleep apnea (adult) (pediatric): Secondary | ICD-10-CM

## 2019-05-26 DIAGNOSIS — R519 Headache, unspecified: Secondary | ICD-10-CM | POA: Diagnosis not present

## 2019-05-26 DIAGNOSIS — H5711 Ocular pain, right eye: Secondary | ICD-10-CM

## 2019-05-26 DIAGNOSIS — G4485 Primary stabbing headache: Secondary | ICD-10-CM | POA: Diagnosis not present

## 2019-05-26 DIAGNOSIS — G500 Trigeminal neuralgia: Secondary | ICD-10-CM | POA: Diagnosis not present

## 2019-05-26 DIAGNOSIS — R51 Headache with orthostatic component, not elsewhere classified: Secondary | ICD-10-CM

## 2019-05-26 NOTE — Telephone Encounter (Signed)
Called and mailbox full, could not LM.

## 2019-05-26 NOTE — Progress Notes (Signed)
PATIENT: Phyllis Calhoun DOB: [birth year]  REASON FOR VISIT: follow up HISTORY FROM: patient  Chief Complaint  Patient presents with   Follow-up    New room, alone. On and off pains. Flare up last week. Pain and "electric shocks"     HISTORY OF PRESENT ILLNESS: Today 96/78/93 Phyllis Calhoun is a 43 y.o. female here today for follow up for concerns of trigeminal neuralgia. She reports that pain is worsening. Pain comes and goes. Feels like an electrical shock. She has had pain with moving her right eye. No vision changes. She has not been seen by ophthalmology. She was advised to start Ajovy and increase topiramate to 124m twice daily. It is unclear if she took Ajovy. She is unable to answer if she took injections. She knows that she is not taking them now. [facility] to discuss alternative options for trigeminal neuralgia. She reports losing about 50 pounds over the past 6 months and is planning bariatric surgery. She is s/p gamma knife procedure that did help in 2011. She has been on multiple medications including topiramate, Robaxin, meloxicam, verapamil, nerve blocks, renal palate ganglion blocks, ketamine infusions, carbamazepine, gabapentin, Botox, Effexor, and Butrans patches. She has mild OSA (REM dependent) but opted for medical weight management to avoid CPAP. She is currently taking topiramate 1043mtwice daily.    HISTORY: (copied from Dr AhCathren Laineote on 10/10/08/1751 HPI:  FeKathlyne Calhoun a [age].o. female here as requested by [facility] for migraines. PMHx morbid obesity, insomnia, migraine, trigeminal neuralgia status post gamma knife. She is on Topiramate 50 mg twice daily and sumatriptan.  Patient has obstructive sleep apnea and is untreated.  Her trigeminal neuralgia began in 2011 after a root canal procedure.  She is aware that she is overweight and attributes her trigeminal neuralgia to weight gain since 2011.   Although she realizes she did not lose weight in the last year even though her trigeminal neuralgia was under control.  She also realizes she has been overweight for a long time.  She tried topiramate and when she got to 100 mg she had side effects dizziness became numb.  She has 2 to 3 days weeks with the headaches.  She misses work about 7 to 8 days because of her headaches.  She also frequently overuses her medications over-the-counter per past medical records and developed medication overuse headache. There is lots of stress and that makes things worse. The trigeminal neuralgia is worse and it flares the migraines. She wakes with migraines in the morning. Her trigeminal neuralgia is every day, triggers are touching her face or chewing, it is severe and brief, In all 3 branches of the trigeminal nerve. She is on Topiramate twice daily. Her migraines are triggered by irritation, stress, trigeminal neuralgia flair. They start at the back of the head, like someone standing on her shoulder, she has light and sound sensitivity, nausea, she can;t concentrate, laying down helps and she can;t function. She takes hydrocodone 4x a day and is aware these can worsen headaches but no ibuprofen, tylenol or goody powders. She takes Meloxicam.    Migraine medications tried: Topiramate, Robaxin, meloxicam, verapamil, gamma knife, occipital nerve blocks, renal palate team ganglion blocks, ketamine infusions, carbamazepine, gabapentin, Botox, Effexor, Butrans patches.  She had gamma knife procedure in 2011, occipital blocks, transnasal sphenopalatine nerve blocks and 3 ketamine infusions.  She is also taken ibuprofen, Tylenol and prior physicians  concern for medication overuse headaches she does not take caffeine..  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  Reviewed lab work.  Labs were taken September 12, 2018.  BUN 10, creatinine 0.87, c-Met normal, CBC normal, TSH 1.52.   Patient's had trigeminal neuralgia status post gamma knife.  She had a possible vasovagal episode in the past.  Migraines and trigeminal neuralgia.  She was seen for morbid obesity and is followed with bariatric surgery in hopes of future weight loss surgery.  She has had a few syncopal episodes in the setting of migraine.  She was laying in bed was getting up to go to the bathroom and must have fainted.  She had noted a migraine all day.  Has followed with neurology for migraines and trigeminal neuralgia.  She also stopped going to pain medicine and has been off pain medicines since October.  She also takes Tylenol.  She has been on verapamil in the past, meloxicam, hydrocodone, Robaxin.  She feels her trigeminal neuralgia is worse, the pain is constant but hurts if she touches her lip, the pain feels like a bad toothache, like an irritating aching pain.  She has sharp pains that run through her mouth going crazy in the entire right side of her head starts to hurt and she feels it sets off her headaches.  She denies ipsilateral autonomic features with her headache.  Severe pain 1 to 2 minutes and goes back and forth from the bottom nerve to the top nerve, the entire right side is shooting pain.  Worsening.  Personally reviewed CT of the head images completed in February 2015 for frontal headache, dizziness, right upper and lower extremity numbness:   Ct showed No acute intracranial abnormalities including mass lesion or mass effect, hydrocephalus, extra-axial fluid collection, midline shift, hemorrhage, or acute infarction, large ischemic events (personally reviewed images)   REVIEW OF SYSTEMS: Out of a complete 14 system review of symptoms, the patient complains only of the following symptoms, facial pain, headaches, fatigue, numbness, blurred vision, and all other  reviewed systems are negative.  ALLERGIES: Not on File  HOME MEDICATIONS: Outpatient Medications Prior to Visit  Medication Sig Dispense Refill   acetaminophen (TYLENOL) 500 MG tablet Take 1,000 mg by mouth every 6 (six) hours as needed for pain. Pain.     diclofenac sodium (VOLTAREN) 1 % GEL APPLY 4 GRAMS 2 TO 3 TIMES DAILY AS NEEDED     Fremanezumab-vfrm (AJOVY) 225 MG/1.5ML SOSY Inject 225 mg into the skin every 30 (thirty) days. 1 Syringe 11   HYDROcodone-acetaminophen (NORCO) 10-325 MG tablet Take 1 tablet by mouth every 6 (six) hours as needed for moderate pain.     meloxicam (MOBIC) 15 MG tablet Take 15 mg by mouth daily.     methocarbamol (ROBAXIN) 750 MG tablet Take 750 mg by mouth every 8 (eight) hours as needed for muscle spasms.  1   phentermine (ADIPEX-P) 37.5 MG tablet Take 37.5 mg by mouth daily.     SUMAtriptan (IMITREX) 100 MG tablet Take 100 mg by mouth every 2 (two) hours as needed for migraine or headache. May repeat in 2 hours if headache persists or recurs.     topiramate (TOPAMAX) 100 MG tablet TAKE 1 TABLET BY MOUTH TWICE A DAY 180 tablet 2   Vitamin D, Ergocalciferol, (  DRISDOL) 1.25 MG (50000 UT) CAPS capsule Take 50,000 Units by mouth once a week.     benzonatate (TESSALON) 100 MG capsule Take 1 capsule (100 mg total) by mouth every 8 (eight) hours. (Patient not taking: Reported on 10/23/2016) 21 capsule 0   naproxen (NAPROSYN) 500 MG tablet Take 1 tablet (500 mg total) by mouth 2 (two) times daily. (Patient not taking: Reported on 10/23/2016) 30 tablet 0   No facility-administered medications prior to visit.     PAST MEDICAL HISTORY: Past Medical History:  Diagnosis Date   Arthritis    both legs   Gallstones    HX OF SEVERAL GALLBLADDER ATTACKS   Headache(784.0)    PT STATES MIGRAINES FROM TRIGEMINAL NEURALGIA   Sleep apnea    sleep study confirmed   Trigeminal neuralgia    2012-PT CONTINUES TO HAVE PAIN RIGHT SIDE OF FACE    PAST  SURGICAL HISTORY: Past Surgical History:  Procedure Laterality Date   ABDOMINAL HYSTERECTOMY     BREAST REDUCTION SURGERY     CHOLECYSTECTOMY N/A 05/25/2013   Procedure: LAPAROSCOPIC CHOLECYSTECTOMY WITH INTRAOPERATIVE CHOLANGIOGRAM;  Surgeon: Ralene Ok, MD;  Location: [facility];  Service: General;  Laterality: N/A;   KNEE ARTHROSCOPY Left 12/29/2015   Procedure: ARTHROSCOPY KNEE;  Surgeon: Dorna Leitz, MD;  Location: [facility];  Service: Orthopedics;  Laterality: Left;   TONSILLECTOMY      FAMILY HISTORY: Family History  Problem Relation Age of Onset   Migraines Neg Hx     SOCIAL HISTORY: Social History   Socioeconomic History   Marital status: Married    Spouse name: Not on file   Number of children: 2   Years of education: Not on file   Highest education level: Some college, no degree  Occupational History   Not on file  Social Needs   Financial resource strain: Not on file   Food insecurity    Worry: Not on file    Inability: Not on file   Transportation needs    Medical: Not on file    Non-medical: Not on file  Tobacco Use   Smoking status: Never Smoker   Smokeless tobacco: Never Used  Substance and Sexual Activity   Alcohol use: No   Drug use: No   Sexual activity: Not on file  Lifestyle   Physical activity    Days per week: Not on file    Minutes per session: Not on file   Stress: Not on file  Relationships   Social connections    Talks on phone: Not on file    Gets together: Not on file    Attends religious service: Not on file    Active member of club or organization: Not on file    Attends meetings of clubs or organizations: Not on file    Relationship status: Not on file   Intimate partner violence    Fear of current or ex partner: Not on file    Emotionally abused: Not on file    Physically abused: Not on file    Forced sexual activity: Not on file  Other Topics Concern   Not on file  Social History Narrative   Lives  at home with her husband, parents, and her son   Right handed   Caffeine: mostly water, tea here and there      PHYSICAL EXAM  Vitals:   05/26/19 0809  BP: (!) 130/94  Pulse: 84  Temp: 97.6 F (36.4 C)  Weight: (!) 384  lb (174.2 kg)  Height: _0  (1.727 m)   Body mass index is 58.39 kg/m.  Generalized: Well developed, in no acute distress  Cardiology: normal rate and rhythm, no murmur noted Neurological examination  Mentation: Alert oriented to time, place, history taking. Follows all commands speech and language fluent Cranial nerve II-XII: Pupils were equal round reactive to light. Extraocular movements were full, visual field were full on confrontational test. Facial sensation and strength were normal. Uvula tongue midline. Head turning and shoulder shrug  were normal and symmetric. Optic disc appears intact.  Motor: The motor testing reveals 5 over 5 strength of all 4 extremities. Good symmetric motor tone is noted throughout. No tenderness with palpation of right temple.  Sensory: Sensory testing is intact to soft touch on all 4 extremities. No evidence of extinction is noted.  Coordination: Cerebellar testing reveals good finger-nose-finger and heel-to-shin bilaterally.  Gait and station: Gait is normal. Tandem gait is normal. Romberg is negative. No drift is seen.  Reflexes: Deep tendon reflexes are symmetric and normal bilaterally.   DIAGNOSTIC DATA (LABS, IMAGING, TESTING) - I reviewed patient records, labs, notes, testing and imaging myself where available.  No flowsheet data found.   Lab Results  Component Value Date   WBC 11.8 (H) 10/23/2016   HGB 11.6 (L) 10/23/2016   HCT 37.6 10/23/2016   MCV 82.8 10/23/2016   PLT 321 10/23/2016      Component Value Date/Time   NA 138 10/23/2016 1645   K 4.4 10/23/2016 1645   CL 104 10/23/2016 1645   CO2 25 10/23/2016 1645   GLUCOSE 90 10/23/2016 1645   BUN 9 10/23/2016 1645   CREATININE 0.73 10/23/2016 1645    CALCIUM 9.0 10/23/2016 1645   PROT 6.2 (L) 01/21/2016 0241   ALBUMIN 3.0 (L) 01/21/2016 0241   AST 18 01/21/2016 0241   ALT 9 (L) 01/21/2016 0241   ALKPHOS 77 01/21/2016 0241   BILITOT 0.4 01/21/2016 0241   GFRNONAA >60 10/23/2016 1645   GFRAA >60 10/23/2016 1645   No results found for: CHOL, HDL, LDLCALC, LDLDIRECT, TRIG, CHOLHDL No results found for: HGBA1C No results found for: VITAMINB12 No results found for: TSH     ASSESSMENT AND PLAN 43 y.o. year old female  has a past medical history of Arthritis, Gallstones, Headache(784.0), Sleep apnea, and Trigeminal neuralgia. here with     ICD-10-CM   1. Trigeminal neuralgia of right side of face  G50.0 MR BRAIN W WO CONTRAST    MR ORBITS W WO CONTRAST    CBC with Differential/Platelets    CMP    C-reactive Protein    Sedimentation Rate  2. OSA (obstructive sleep apnea)  G47.33   3. Nonintractable episodic headache, unspecified headache type  R51.9 MR BRAIN W WO CONTRAST  4. Primary stabbing headache  G44.85 MR BRAIN W WO CONTRAST    MR ORBITS W WO CONTRAST    CBC with Differential/Platelets    CMP    C-reactive Protein    Sedimentation Rate  5. Positional headache  R51.0 MR BRAIN W WO CONTRAST    MR ORBITS W WO CONTRAST    CBC with Differential/Platelets    CMP    C-reactive Protein    Sedimentation Rate  6. Pain of right eye  H57.11 MR BRAIN W WO CONTRAST    MR ORBITS W WO CONTRAST    CBC with Differential/Platelets    CMP    C-reactive Protein    Sedimentation Rate  It is uncertain if pain she is describing currently is return of trigeminal neuralgia.  There are components of history that could be associated with trigeminal neuralgia, temporal arteritis or complicated migraines.  She is requesting an MRI.  Last MRI documented was in 2015.  I feel that it is reasonable to assess for any new intracranial concerns.  We will also order MR of orbits to assess concerns of eye pain.  I have advised that she reach out to her  ophthalmologist immediately for further evaluation.  There is no tenderness on exam today.  I will order labs to evaluate.  She was advised to continue current therapy.  She will follow-up with [facility] next month for evaluation and consider treatment for concerns of trigeminal neuralgia.  She has had multiple failed treatments with our office as well as [facility]: [facility].  We will follow-up pending MRI results and follow-up with [facility].  She was advised of emergent symptoms and when to seek emergency medical attention.  She verbalizes understanding.   Orders Placed This Encounter  Procedures   MR BRAIN W WO CONTRAST    Standing Status:   Future    Standing Expiration Date:   07/25/2020    Order Specific Question:   If indicated for the ordered procedure, I authorize the administration of contrast media per Radiology protocol    Answer:   Yes    Order Specific Question:   What is the patient's sedation requirement?    Answer:   No Sedation    Order Specific Question:   Does the patient have a pacemaker or implanted devices?    Answer:   Yes    Order Specific Question:   Radiology Contrast Protocol - do NOT remove file path    Answer:   \[email or web address]    Order Specific Question:   Preferred imaging location?    Answer:   Internal   MR ORBITS W WO CONTRAST    Standing Status:   Future    Standing Expiration Date:   07/25/2020    Order Specific Question:   GRA to provide read?    Answer:   Yes    Order Specific Question:   If indicated for the ordered procedure, I authorize the administration of contrast media per Radiology protocol    Answer:   Yes    Order Specific Question:   What is the patient's sedation requirement?    Answer:   No Sedation    Order Specific Question:   Does the patient have a pacemaker or implanted devices?    Answer:   Yes    Order Specific Question:   Preferred imaging location?    Answer:   Internal    Order Specific  Question:   Radiology Contrast Protocol - do NOT remove file path    Answer:   \[email or web address]   CBC with Differential/Platelets   CMP   C-reactive Protein   Sedimentation Rate     No orders of the defined types were placed in this encounter.     I spent 45 minutes with the patient. 50% of this time was spent counseling and educating patient on plan of care and medications.    Debbora Presto, FNP-C 05/26/2019, 4:17 PM [facility] [address], [place], [place] [address] [phone]

## 2019-05-26 NOTE — Telephone Encounter (Signed)
Pt called stating that she forgot to ask a question when she was here for her appt. Pt would like to know if she can try either Carbamazepine or the Gabapentin. Please advise.

## 2019-05-26 NOTE — Telephone Encounter (Signed)
Mailbox full, could not LM.   

## 2019-05-27 ENCOUNTER — Telehealth: Payer: Self-pay | Admitting: Family Medicine

## 2019-05-27 LAB — COMPREHENSIVE METABOLIC PANEL
ALT: 6 IU/L (ref 0–32)
AST: 12 IU/L (ref 0–40)
Albumin/Globulin Ratio: 1.5 (ref 1.2–2.2)
Albumin: 4.1 g/dL (ref 3.8–4.8)
Alkaline Phosphatase: 111 IU/L (ref 39–117)
BUN/Creatinine Ratio: 12 (ref 9–23)
BUN: 11 mg/dL (ref 6–24)
Bilirubin Total: <0.2 mg/dL (ref 0.0–1.2)
CO2: 23 mmol/L (ref 20–29)
Calcium: 9.7 mg/dL (ref 8.7–10.2)
Chloride: 110 mmol/L — ABNORMAL HIGH (ref 96–106)
Creatinine, Ser: 0.92 mg/dL (ref 0.57–1.00)
GFR calc Af Amer: 87 mL/min/{1.73_m2} (ref >59)
GFR calc non Af Amer: 75 mL/min/{1.73_m2} (ref >59)
Globulin, Total: 2.7 g/dL (ref 1.5–4.5)
Glucose: 83 mg/dL (ref 65–99)
Potassium: 5 mmol/L (ref 3.5–5.2)
Sodium: 145 mmol/L — ABNORMAL HIGH (ref 134–144)
Total Protein: 6.8 g/dL (ref 6.0–8.5)

## 2019-05-27 LAB — SEDIMENTATION RATE: Sed Rate: 14 mm/hr (ref 0–32)

## 2019-05-27 LAB — CBC WITH DIFFERENTIAL/PLATELET
Basophils Absolute: 0 10*3/uL (ref 0.0–0.2)
Basos: 0 %
EOS (ABSOLUTE): 0.2 10*3/uL (ref 0.0–0.4)
Eos: 2 %
Hematocrit: 38.2 % (ref 34.0–46.6)
Hemoglobin: 12.1 g/dL (ref 11.1–15.9)
Immature Grans (Abs): 0 10*3/uL (ref 0.0–0.1)
Immature Granulocytes: 0 %
Lymphocytes Absolute: 2.9 10*3/uL (ref 0.7–3.1)
Lymphs: 36 %
MCH: 27.4 pg (ref 26.6–33.0)
MCHC: 31.7 g/dL (ref 31.5–35.7)
MCV: 86 fL (ref 79–97)
Monocytes Absolute: 0.5 10*3/uL (ref 0.1–0.9)
Monocytes: 6 %
Neutrophils Absolute: 4.5 10*3/uL (ref 1.4–7.0)
Neutrophils: 56 %
Platelets: 301 10*3/uL (ref 150–450)
RBC: 4.42 x10E6/uL (ref 3.77–5.28)
RDW: 14.6 % (ref 11.7–15.4)
WBC: 8.2 10*3/uL (ref 3.4–10.8)

## 2019-05-27 LAB — C-REACTIVE PROTEIN: CRP: 11 mg/L — ABNORMAL HIGH (ref 0–10)

## 2019-05-27 NOTE — Telephone Encounter (Signed)
Lets get eye exam and MRI first. She has been on multiple medications with no benefit in past. I will watch for report from ophthalmology and review labs. She should keep appt with [facility]. I am hoping they may have a better option to treat pain once seeing her. She can restart Ajovy as I don't think she took this medication consistently.

## 2019-05-27 NOTE — Telephone Encounter (Signed)
[employer] Auth: NPR spoke to [place] Ref # [id number] order sent to GI,  They will reach out to the patient to schedule.

## 2019-05-27 NOTE — Progress Notes (Signed)
Made any corrections needed, and agree with history, physical, neuro exam,assessment and plan as stated.     Jocob Dambach, MD [facility]  

## 2019-05-31 NOTE — Telephone Encounter (Signed)
Pt is wanting someone to contact GI to get her MRI earlier due to her Eye Exam that was previously done

## 2019-06-01 NOTE — Telephone Encounter (Signed)
I spoke to the patient and informed her that I called GI and they didn't have anything sooner than 06/19/19. I informed her she could call GI for cancellation. I also informed her that I will fax the orders to [facility] they may have something sooner. I faxed the orders and they will reach out to the patient to schedule.

## 2019-06-01 NOTE — Telephone Encounter (Signed)
Patient is scheduled at [facility] for 06/07/19.

## 2019-06-02 ENCOUNTER — Other Ambulatory Visit: Payer: Self-pay | Admitting: *Deleted

## 2019-06-02 MED ORDER — GABAPENTIN 100 MG PO CAPS: 100.0000 mg | ORAL_CAPSULE | Freq: Three times a day (TID) | ORAL | 0 refills | Status: AC

## 2019-06-03 ENCOUNTER — Telehealth: Payer: Self-pay | Admitting: *Deleted

## 2019-06-03 NOTE — Telephone Encounter (Signed)
-----   Message from Debbora Presto, NP sent at 06/02/2019 12:50 PM EDT ----- Labs look ok. There was very slight elevation in one of her inflammatory labs but this is non specific and I do not think it is related to pain as eye exam was reportedly normal. I am not opposed to starting a low dose gabapentin 100mg  up to three times daily for concerns of trigeminal pain. Trouble is that none of these medications have worked in the past. I think the best plan of action is to follow up with [facility] and proceed with their recommendations. We can try gabapentin in interim if she wishes.

## 2019-06-03 NOTE — Telephone Encounter (Signed)
Relayed results to pt by Debbora Presto, NP by mychart.

## 2019-06-09 ENCOUNTER — Telehealth: Payer: Self-pay | Admitting: Family Medicine

## 2019-06-09 NOTE — Telephone Encounter (Signed)
Please let her know that I have reviewed the MRI results with [facility]. Both orbits and brain were normal. I recommend she follow up with [facility] neurology as planned.

## 2019-06-19 ENCOUNTER — Other Ambulatory Visit: Payer: Self-pay

## 2020-01-18 ENCOUNTER — Other Ambulatory Visit: Payer: Self-pay

## 2020-01-18 ENCOUNTER — Ambulatory Visit (HOSPITAL_COMMUNITY)
Admission: EM | Admit: 2020-01-18 | Discharge: 2020-01-18 | Disposition: A | Discharge status: Home / Self Care | Payer: BC Managed Care – PPO | Attending: Family Medicine | Admitting: Family Medicine

## 2020-01-18 ENCOUNTER — Encounter (HOSPITAL_COMMUNITY): Payer: Self-pay

## 2020-01-18 DIAGNOSIS — M1990 Unspecified osteoarthritis, unspecified site: Secondary | ICD-10-CM | POA: Insufficient documentation

## 2020-01-18 DIAGNOSIS — R197 Diarrhea, unspecified: Secondary | ICD-10-CM | POA: Diagnosis not present

## 2020-01-18 DIAGNOSIS — R112 Nausea with vomiting, unspecified: Secondary | ICD-10-CM | POA: Insufficient documentation

## 2020-01-18 DIAGNOSIS — Z7902 Long term (current) use of antithrombotics/antiplatelets: Secondary | ICD-10-CM | POA: Diagnosis not present

## 2020-01-18 DIAGNOSIS — E662 Morbid (severe) obesity with alveolar hypoventilation: Secondary | ICD-10-CM | POA: Diagnosis not present

## 2020-01-18 DIAGNOSIS — Z20822 Contact with and (suspected) exposure to covid-19: Secondary | ICD-10-CM | POA: Insufficient documentation

## 2020-01-18 DIAGNOSIS — Z79899 Other long term (current) drug therapy: Secondary | ICD-10-CM | POA: Insufficient documentation

## 2020-01-18 LAB — COMPREHENSIVE METABOLIC PANEL
ALT: 12 U/L (ref 0–44)
AST: 23 U/L (ref 15–41)
Albumin: 4 g/dL (ref 3.5–5.0)
Alkaline Phosphatase: 95 U/L (ref 38–126)
Anion gap: 11 (ref 5–15)
BUN: 8 mg/dL (ref 6–20)
CO2: 25 mmol/L (ref 22–32)
Calcium: 9.7 mg/dL (ref 8.9–10.3)
Chloride: 107 mmol/L (ref 98–111)
Creatinine, Ser: 0.96 mg/dL (ref 0.44–1.00)
GFR calc Af Amer: >60 mL/min (ref >60)
GFR calc non Af Amer: >60 mL/min (ref >60)
Glucose, Bld: 86 mg/dL (ref 70–99)
Potassium: 3.5 mmol/L (ref 3.5–5.1)
Sodium: 143 mmol/L (ref 135–145)
Total Bilirubin: 0.7 mg/dL (ref 0.3–1.2)
Total Protein: 7.9 g/dL (ref 6.5–8.1)

## 2020-01-18 LAB — CBC WITH DIFFERENTIAL/PLATELET
Abs Immature Granulocytes: 0.02 10*3/uL (ref 0.00–0.07)
Basophils Absolute: 0 10*3/uL (ref 0.0–0.1)
Basophils Relative: 0 %
Eosinophils Absolute: 0.3 10*3/uL (ref 0.0–0.5)
Eosinophils Relative: 3 %
HCT: 43.4 % (ref 36.0–46.0)
Hemoglobin: 13.4 g/dL (ref 12.0–15.0)
Immature Granulocytes: 0 %
Lymphocytes Relative: 31 %
Lymphs Abs: 2.4 10*3/uL (ref 0.7–4.0)
MCH: 26.6 pg (ref 26.0–34.0)
MCHC: 30.9 g/dL (ref 30.0–36.0)
MCV: 86.3 fL (ref 80.0–100.0)
Monocytes Absolute: 0.6 10*3/uL (ref 0.1–1.0)
Monocytes Relative: 7 %
Neutro Abs: 4.4 10*3/uL (ref 1.7–7.7)
Neutrophils Relative %: 59 %
Platelets: 326 10*3/uL (ref 150–400)
RBC: 5.03 MIL/uL (ref 3.87–5.11)
RDW: 15.1 % (ref 11.5–15.5)
WBC: 7.6 10*3/uL (ref 4.0–10.5)
nRBC: 0 % (ref 0.0–0.2)

## 2020-01-18 LAB — LIPASE, BLOOD: Lipase: 21 U/L (ref 11–51)

## 2020-01-18 LAB — C DIFFICILE QUICK SCREEN W PCR REFLEX
C Diff antigen: NEGATIVE
C Diff interpretation: NOT DETECTED
C Diff toxin: NEGATIVE

## 2020-01-18 LAB — AMYLASE: Amylase: 29 U/L (ref 28–100)

## 2020-01-18 MED ORDER — ONDANSETRON 4 MG PO TBDP
4.0000 mg | ORAL_TABLET | Freq: Three times a day (TID) | ORAL | 0 refills | Status: AC | PRN
Start: 2020-01-18 — End: ?

## 2020-01-18 NOTE — ED Triage Notes (Signed)
Pt c/o diarrhea and vomiting since Friday, denies abdominal pain other than cramping during bowel movements. Taking dramamine and immodium not helping.

## 2020-01-18 NOTE — ED Provider Notes (Signed)
[facility]    CSN: [id number] Arrival date & time: 01/18/20  1106      History   Chief Complaint Chief Complaint  Patient presents with  . Diarrhea  . Emesis    HPI Phyllis Calhoun is a 44 y.o. female.   Patient is a 44 year old female with past medical history of arthritis, gallstones, headaches, trigeminal neuralgia.  She presents today with approximately 5 days of nausea, vomiting and diarrhea.  Symptoms have been constant.  She has been taking Imodium without much relief.  Reporting approximately 5 episodes of vomiting with sour taste and watery diarrhea daily.  Denies any blood in vomit or stool.  No specific abdominal pain.  No fevers, chills, body aches, recent traveling.  Was on amoxicillin approximate 3 weeks ago but did not finish the entire treatment. No recent sick contacts.  Did eat watermelon and was sitting on the counter unrefrigerated most of the week.  ROS per HPI      Past Medical History:  Diagnosis Date  . Arthritis    both legs  . Gallstones    HX OF SEVERAL GALLBLADDER ATTACKS  . Headache(784.0)    PT STATES MIGRAINES FROM TRIGEMINAL NEURALGIA  . Sleep apnea    sleep study confirmed  . Trigeminal neuralgia    2012-PT CONTINUES TO HAVE PAIN RIGHT SIDE OF FACE    Patient Active Problem List   Diagnosis Date Noted  . 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  . Loud snoring 11/05/2018  . OSA (obstructive sleep apnea) 11/05/2018  . Chest pain 01/20/2016  . Trigeminal neuralgia of right side of face 01/20/2016  . Shortness of breath 01/20/2016  . Acute medial meniscus tear of left knee 12/29/2015  . Acute lateral meniscus tear of left knee 12/29/2015  . 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 Super obesity 12/29/2015    Past Surgical History:  Procedure Laterality Date  . ABDOMINAL HYSTERECTOMY    . BREAST REDUCTION SURGERY    . CHOLECYSTECTOMY N/A 05/25/2013   Procedure: LAPAROSCOPIC  CHOLECYSTECTOMY WITH INTRAOPERATIVE CHOLANGIOGRAM;  Surgeon: Axel Filler, MD;  Location: [facility];  Service: General;  Laterality: N/A;  . KNEE ARTHROSCOPY Left 12/29/2015   Procedure: ARTHROSCOPY KNEE;  Surgeon: Jodi Geralds, MD;  Location: [facility];  Service: Orthopedics;  Laterality: Left;  . TONSILLECTOMY      OB History   No obstetric history on file.      Home Medications    Prior to Admission medications   Medication Sig Start Date End Date Taking? Authorizing Provider  acetaminophen (TYLENOL) 500 MG tablet Take 1,000 mg by mouth every 6 (six) hours as needed for pain. Pain.    [provider]  diclofenac sodium (VOLTAREN) 1 % GEL APPLY 4 GRAMS 2 TO 3 TIMES DAILY AS NEEDED 10/28/18   [provider]  Fremanezumab-vfrm (AJOVY) 225 MG/1.5ML SOSY Inject 225 mg into the skin every 30 (thirty) days. 11/04/18   Anson Fret, MD  gabapentin (NEURONTIN) 100 MG capsule Take 1 capsule (100 mg total) by mouth 3 (three) times daily. 06/02/19   Lomax, Amy, NP  HYDROcodone-acetaminophen (NORCO) 10-325 MG tablet Take 1 tablet by mouth every 6 (six) hours as needed for moderate pain.    [provider]  meloxicam (MOBIC) 15 MG tablet Take 15 mg by mouth daily.    [provider]  methocarbamol (ROBAXIN) 750 MG tablet Take 750 mg by mouth every 8 (eight) hours as needed for muscle spasms.  10/16/16   [provider]  ondansetron (ZOFRAN ODT) 4 MG disintegrating tablet Take 1 tablet (4 mg total) by mouth every 8 (eight) hours as needed for nausea or vomiting. 01/18/20   Loura Halt A, NP  phentermine (ADIPEX-P) 37.5 MG tablet Take 37.5 mg by mouth daily. 10/25/18   [provider]  SUMAtriptan (IMITREX) 100 MG tablet Take 100 mg by mouth every 2 (two) hours as needed for migraine or headache. May repeat in 2 hours if headache persists or recurs.    [provider]  topiramate (TOPAMAX) 100 MG tablet TAKE 1 TABLET BY MOUTH TWICE A DAY 05/03/19    Melvenia Beam, MD  Vitamin D, Ergocalciferol, (DRISDOL) 1.25 MG (50000 UT) CAPS capsule Take 50,000 Units by mouth once a week. 09/14/18   [provider]    Family History Family History  Problem Relation Age of Onset  . Migraines Neg Hx     Social History Social History   Tobacco Use  . Smoking status: Never Smoker  . Smokeless tobacco: Never Used  Vaping Use  . Vaping Use: Never used  Substance Use Topics  . Alcohol use: No  . Drug use: No     Allergies   Patient has no known allergies.   Review of Systems Review of Systems   Physical Exam Triage Vital Signs ED Triage Vitals  Enc Vitals Group     BP 01/18/20 1230 (!) 151/83     Pulse Rate 01/18/20 1230 95     Resp 01/18/20 1230 18     Temp 01/18/20 1230 98.2 F (36.8 C)     Temp src --      SpO2 01/18/20 1230 100 %     Weight --      Height --      Head Circumference --      Peak Flow --      Pain Score 01/18/20 1232 0     Pain Loc --      Pain Edu? --      Excl. in [place]? --    No data found.  Updated Vital Signs BP (!) 151/83   Pulse 95   Temp 98.2 F (36.8 C)   Resp 18   SpO2 100%   Visual Acuity Right Eye Distance:   Left Eye Distance:   Bilateral Distance:    Right Eye Near:   Left Eye Near:    Bilateral Near:     Physical Exam Vitals and nursing note reviewed.  Constitutional:      General: She is not in acute distress.    Appearance: Normal appearance. She is not ill-appearing, toxic-appearing or diaphoretic.  HENT:     Head: Normocephalic.     Nose: Nose normal.     Mouth/Throat:     Pharynx: Oropharynx is clear.  Eyes:     Conjunctiva/sclera: Conjunctivae normal.  Pulmonary:     Effort: Pulmonary effort is normal.  Abdominal:     Palpations: Abdomen is soft.     Tenderness: There is no abdominal tenderness.  Musculoskeletal:        General: Normal range of motion.     Cervical back: Normal range of motion.  Skin:    General: Skin is warm and dry.      Findings: No rash.  Neurological:     Mental Status: She is alert.  Psychiatric:        Mood and Affect: Mood normal.  UC Treatments / Results  Labs (all labs ordered are listed, but only abnormal results are displayed) Labs Reviewed  SARS CORONAVIRUS 2 (TAT 6-24 HRS)  GASTROINTESTINAL PANEL BY PCR, STOOL (REPLACES STOOL CULTURE)  C DIFFICILE QUICK SCREEN W PCR REFLEX  CBC WITH DIFFERENTIAL/PLATELET  COMPREHENSIVE METABOLIC PANEL  LIPASE, BLOOD  AMYLASE    EKG   Radiology No results found.  Procedures Procedures (including critical care time)  Medications Ordered in UC Medications - No data to display  Initial Impression / Assessment and Plan / UC Course  I have reviewed the triage vital signs and the nursing notes.  Pertinent labs & imaging results that were available during my care of the patient were reviewed by me and considered in my medical decision making (see chart for details).     Nausea vomiting diarrhea Differentials include, c diff, viral gastroenteritis, bowel obstruction, infectious diarrhea.  Pt has already had her gallbladder removed. Patient not having any abdominal pain,  no acute abdomen. Non toxic or ill appearing.  Obtain stool sample here to test for GI pathogen and C. Difficile Recommend probiotic Zofran for nausea, vomiting as needed Blood work unremarkable Stay hydrated, bland diet.  Follow up with primary care.  Unable to do x-ray today based on patient's body habitus  Final Clinical Impressions(s) / UC Diagnoses   Final diagnoses:  Nausea vomiting and diarrhea     Discharge Instructions     We are unable to do the xray today.  We did get the blood work.  I will give you some medicine for N,V I recommend probiotic.  If your symptoms worsen I would contact your primary care or go to the ER.     ED Prescriptions    Medication Sig Dispense Auth. Provider   ondansetron (ZOFRAN ODT) 4 MG disintegrating tablet Take 1  tablet (4 mg total) by mouth every 8 (eight) hours as needed for nausea or vomiting. 20 tablet Dahlia Byes A, NP     PDMP not reviewed this encounter.   Janace Aris, NP 01/18/20 1454

## 2020-01-18 NOTE — Discharge Instructions (Signed)
We are unable to do the xray today.  We did get the blood work.  I will give you some medicine for N,V I recommend probiotic.  If your symptoms worsen I would contact your primary care or go to the ER.

## 2020-01-19 ENCOUNTER — Encounter: Payer: Self-pay | Admitting: *Deleted

## 2020-01-19 LAB — SARS CORONAVIRUS 2 (TAT 6-24 HRS): SARS Coronavirus 2: NEGATIVE

## 2020-01-19 LAB — GASTROINTESTINAL PANEL BY PCR, STOOL (REPLACES STOOL CULTURE)

## 2020-01-19 MED ORDER — TOPIRAMATE 100 MG PO TABS: 100.0000 mg | ORAL_TABLET | Freq: Two times a day (BID) | ORAL | 0 refills | Status: AC

## 2020-06-23 ENCOUNTER — Emergency Department (HOSPITAL_COMMUNITY)
Admission: EM | Admit: 2020-06-23 | Discharge: 2020-06-24 | Disposition: A | Discharge status: Home / Self Care | Payer: BC Managed Care – PPO | Attending: Emergency Medicine | Admitting: Emergency Medicine

## 2020-06-23 DIAGNOSIS — Z5321 Procedure and treatment not carried out due to patient leaving prior to being seen by health care provider: Secondary | ICD-10-CM | POA: Diagnosis not present

## 2020-06-23 DIAGNOSIS — M25562 Pain in left knee: Secondary | ICD-10-CM | POA: Insufficient documentation

## 2020-06-23 NOTE — ED Notes (Signed)
Pt checked out AMA. 

## 2020-06-23 NOTE — ED Triage Notes (Signed)
Pt presents to ED BIB EMS from home. Pt c/o L knee pain that radiates to back. Pt reports that she got steroid shot yesterday and has also been exerting more today. EMS VSS.

## 2020-06-24 ENCOUNTER — Encounter (HOSPITAL_COMMUNITY): Payer: Self-pay

## 2020-06-24 ENCOUNTER — Ambulatory Visit (HOSPITAL_COMMUNITY)
Admission: EM | Admit: 2020-06-24 | Discharge: 2020-06-24 | Disposition: A | Discharge status: Home / Self Care | Payer: BC Managed Care – PPO | Attending: Urgent Care | Admitting: Urgent Care

## 2020-06-24 ENCOUNTER — Other Ambulatory Visit: Payer: Self-pay

## 2020-06-24 DIAGNOSIS — M5442 Lumbago with sciatica, left side: Secondary | ICD-10-CM

## 2020-06-24 DIAGNOSIS — S39012A Strain of muscle, fascia and tendon of lower back, initial encounter: Secondary | ICD-10-CM

## 2020-06-24 MED ORDER — METHYLPREDNISOLONE ACETATE 80 MG/ML IJ SUSP
80.0000 mg | Freq: Once | INTRAMUSCULAR | Status: AC
  Administered 2020-06-24: 80 mg via INTRAMUSCULAR

## 2020-06-24 MED ORDER — TIZANIDINE HCL 4 MG PO TABS: 4.0000 mg | ORAL_TABLET | Freq: Three times a day (TID) | ORAL | 0 refills | Status: AC | PRN

## 2020-06-24 MED ORDER — METHYLPREDNISOLONE ACETATE 80 MG/ML IJ SUSP
INTRAMUSCULAR | Status: AC
  Filled 2020-06-24: qty 1

## 2020-06-24 NOTE — ED Triage Notes (Signed)
Pt reports having lower left-sided back pain radiates to left left knee since yesterday, when she woke up. Standing, walking and bending worsens the pain. States she had a steroid shot in the left knee 2 days ago at the pain clinic, and went to the ED by EMS yesterday and left with be seen, because of the waiting time. Hydrocodone gives no relief, last dose 6 am today.

## 2020-06-24 NOTE — ED Provider Notes (Signed)
[facility]   MRN: [id number] DOB: 12-09-1975  Subjective:   Phyllis Calhoun is a 44 y.o. female presenting for 1 day history of acute onset worsening left low back pain that shoots into her left thigh and is causing tingling in her left lower leg, feet.  Patient states that she was doing a lot of heavy lifting, labs pending the day before.  Denies any particular fall, trauma, car accident.  She has chronic pain, has been using hydrocodone without any relief.  Does not have a history of diabetes.   No current facility-administered medications for this encounter.  Current Outpatient Medications:  .  acetaminophen (TYLENOL) 500 MG tablet, Take 1,000 mg by mouth every 6 (six) hours as needed for pain. Pain., Disp: , Rfl:  .  diclofenac sodium (VOLTAREN) 1 % GEL, APPLY 4 GRAMS 2 TO 3 TIMES DAILY AS NEEDED, Disp: , Rfl:  .  Fremanezumab-vfrm (AJOVY) 225 MG/1.5ML SOSY, Inject 225 mg into the skin every 30 (thirty) days., Disp: 1 Syringe, Rfl: 11 .  gabapentin (NEURONTIN) 100 MG capsule, Take 1 capsule (100 mg total) by mouth 3 (three) times daily., Disp: 90 capsule, Rfl: 0 .  HYDROcodone-acetaminophen (NORCO) 10-325 MG tablet, Take 1 tablet by mouth every 6 (six) hours as needed for moderate pain., Disp: , Rfl:  .  meloxicam (MOBIC) 15 MG tablet, Take 15 mg by mouth daily., Disp: , Rfl:  .  methocarbamol (ROBAXIN) 750 MG tablet, Take 750 mg by mouth every 8 (eight) hours as needed for muscle spasms., Disp: , Rfl: 1 .  ondansetron (ZOFRAN ODT) 4 MG disintegrating tablet, Take 1 tablet (4 mg total) by mouth every 8 (eight) hours as needed for nausea or vomiting., Disp: 20 tablet, Rfl: 0 .  phentermine (ADIPEX-P) 37.5 MG tablet, Take 37.5 mg by mouth daily., Disp: , Rfl:  .  SUMAtriptan (IMITREX) 100 MG tablet, Take 100 mg by mouth every 2 (two) hours as needed for migraine or headache. May repeat in 2 hours if headache persists or recurs., Disp: , Rfl:  .  topiramate (TOPAMAX)  100 MG tablet, Take 1 tablet (100 mg total) by mouth 2 (two) times daily., Disp: 120 tablet, Rfl: 0 .  Vitamin D, Ergocalciferol, (DRISDOL) 1.25 MG (50000 UT) CAPS capsule, Take 50,000 Units by mouth once a week., Disp: , Rfl:    No Known Allergies  Past Medical History:  Diagnosis Date  . Arthritis    both legs  . Gallstones    HX OF SEVERAL GALLBLADDER ATTACKS  . Headache(784.0)    PT STATES MIGRAINES FROM TRIGEMINAL NEURALGIA  . Sleep apnea    sleep study confirmed  . Trigeminal neuralgia    2012-PT CONTINUES TO HAVE PAIN RIGHT SIDE OF FACE     Past Surgical History:  Procedure Laterality Date  . ABDOMINAL HYSTERECTOMY    . BREAST REDUCTION SURGERY    . CHOLECYSTECTOMY N/A 05/25/2013   Procedure: LAPAROSCOPIC CHOLECYSTECTOMY WITH INTRAOPERATIVE CHOLANGIOGRAM;  Surgeon: Axel Filler, MD;  Location: [facility];  Service: General;  Laterality: N/A;  . KNEE ARTHROSCOPY Left 12/29/2015   Procedure: ARTHROSCOPY KNEE;  Surgeon: Jodi Geralds, MD;  Location: [facility];  Service: Orthopedics;  Laterality: Left;  . TONSILLECTOMY      Family History  Problem Relation Age of Onset  . Migraines Neg Hx     Social History   Tobacco Use  . Smoking status: Never Smoker  . Smokeless tobacco: Never Used  Vaping Use  .  Vaping Use: Never used  Substance Use Topics  . Alcohol use: No  . Drug use: No    ROS   Objective:   Vitals: BP (!) 145/88 (BP Location: Right Wrist)   Pulse 78   Temp 97.8 F (36.6 C) (Oral)   Resp 20   SpO2 99%   Physical Exam Constitutional:      General: She is not in acute distress.    Appearance: Normal appearance. She is well-developed. She is not ill-appearing, toxic-appearing or diaphoretic.  HENT:     Head: Normocephalic and atraumatic.     Nose: Nose normal.     Mouth/Throat:     Mouth: Mucous membranes are moist.     Pharynx: Oropharynx is clear.  Eyes:     General: No scleral icterus.    Extraocular Movements: Extraocular movements intact.       Pupils: Pupils are equal, round, and reactive to light.  Cardiovascular:     Rate and Rhythm: Normal rate.  Pulmonary:     Effort: Pulmonary effort is normal.  Musculoskeletal:     Lumbar back: Spasms and tenderness present. No swelling, edema, deformity, signs of trauma, lacerations or bony tenderness. Decreased range of motion. Positive left straight leg raise test. Negative right straight leg raise test. No scoliosis.       Back:     Comments: Strength 5/5 for lower extremities.  Full range of motion.  Skin:    General: Skin is warm and dry.  Neurological:     General: No focal deficit present.     Mental Status: She is alert and oriented to person, place, and time.     Motor: No weakness.     Coordination: Coordination abnormal (favoring left low back/hip).     Gait: Gait normal.     Deep Tendon Reflexes: Reflexes normal.  Psychiatric:        Mood and Affect: Mood normal.        Behavior: Behavior normal.        Thought Content: Thought content normal.        Judgment: Judgment normal.      IMPRESSION:  1. The posterior cerebral arteries encroach upon the proximal cisternal  portions of the trigeminal nerves near the root entry zone bilaterally.  This is nonspecific and no secondary findings of nerve deviation, signal  abnormality, or atrophy identified. Correlation with trigeminal neuralgia  symptoms recommended.  2. Partially empty sella of uncertain significance.   Electronically Signed by: Corey Harold, MD, [facility]  Electronically Signed on: 10/14/2019 7:37 AM   INDICATION: Headache, cluster/trigeminal, stereotactic thin slice CT  through the skull base with 3D reconstruction, G50.0 Trigeminal neuralgia   COMPARISON: MRI October 10, 2019   TECHNIQUE: Standard noncontrast brain CT.   FINDINGS:  Brain Parenchyma: There is no hemorrhage, cerebral edema, acute cortical  infarction, mass, mass effect, or midline shift.  Ventricles and Sulci: Normal  for age.   Extra-Axial Spaces: No extra-axial fluid collection.  Basal Cisterns: Normal.   Paranasal Sinuses: Normal.  Mastoid air cells: Normal.   Orbits: Normal.  Cranium and Bones: Normal.  Soft Tissues: Normal.    IMPRESSION:  No acute intracranial abnormalities.    Assessment and Plan :   I have reviewed the PDMP during this encounter.  1. Acute left-sided low back pain with left-sided sciatica   2. Strain of lumbar region, initial encounter     Recommended IM Depo-Medrol given severity of her pain.  Continue chronic  pain medications.  Use tizanidine as well.  Follow-up with spine specialist. Counseled patient on potential for adverse effects with medications prescribed/recommended today, ER and return-to-clinic precautions discussed, patient verbalized understanding.    Wallis Bamberg, PA-C 06/24/20 1138

## 2020-07-06 ENCOUNTER — Other Ambulatory Visit: Payer: Self-pay | Admitting: Pain Medicine

## 2020-07-06 DIAGNOSIS — M545 Low back pain, unspecified: Secondary | ICD-10-CM

## 2020-07-06 DIAGNOSIS — M25562 Pain in left knee: Secondary | ICD-10-CM

## 2020-07-16 ENCOUNTER — Ambulatory Visit
Admission: RE | Admit: 2020-07-16 | Discharge: 2020-07-16 | Disposition: A | Discharge status: Home / Self Care | Payer: BC Managed Care – PPO | Source: Ambulatory Visit | Attending: Pain Medicine | Admitting: Pain Medicine

## 2020-07-16 ENCOUNTER — Other Ambulatory Visit: Payer: Self-pay

## 2020-07-16 DIAGNOSIS — M25562 Pain in left knee: Secondary | ICD-10-CM

## 2020-07-16 DIAGNOSIS — M545 Low back pain, unspecified: Secondary | ICD-10-CM

## 2020-07-20 ENCOUNTER — Telehealth: Payer: Self-pay | Admitting: *Deleted

## 2020-07-21 ENCOUNTER — Other Ambulatory Visit: Payer: Self-pay | Admitting: Pain Medicine

## 2020-07-21 DIAGNOSIS — M5126 Other intervertebral disc displacement, lumbar region: Secondary | ICD-10-CM

## 2020-08-01 ENCOUNTER — Ambulatory Visit
Admission: RE | Admit: 2020-08-01 | Discharge: 2020-08-01 | Disposition: A | Discharge status: Home / Self Care | Payer: BC Managed Care – PPO | Source: Ambulatory Visit | Attending: Pain Medicine | Admitting: Pain Medicine

## 2020-08-01 DIAGNOSIS — M5126 Other intervertebral disc displacement, lumbar region: Secondary | ICD-10-CM

## 2020-08-01 MED ORDER — IOPAMIDOL (ISOVUE-M 200) INJECTION 41%
1.0000 mL | Freq: Once | INTRAMUSCULAR | Status: AC
  Administered 2020-08-01: 1 mL via EPIDURAL

## 2020-08-01 MED ORDER — METHYLPREDNISOLONE ACETATE 40 MG/ML INJ SUSP (RADIOLOG
120.0000 mg | Freq: Once | INTRAMUSCULAR | Status: AC
  Administered 2020-08-01: 120 mg via EPIDURAL

## 2020-08-01 NOTE — Discharge Instructions (Signed)

## 2020-08-11 NOTE — Telephone Encounter (Signed)
I spoke to the patient who reports her trigeminal neuralgia is under control w/ her treatments at [facility]. She does not plan to proceed with the ordered MRI.

## 2020-11-09 ENCOUNTER — Other Ambulatory Visit: Payer: Self-pay | Admitting: Physician Assistant

## 2020-11-09 DIAGNOSIS — M4726 Other spondylosis with radiculopathy, lumbar region: Secondary | ICD-10-CM

## 2020-11-10 ENCOUNTER — Ambulatory Visit
Admission: RE | Admit: 2020-11-10 | Discharge: 2020-11-10 | Disposition: A | Discharge status: Home / Self Care | Payer: BC Managed Care – PPO | Source: Ambulatory Visit | Attending: Physician Assistant | Admitting: Physician Assistant

## 2020-11-10 ENCOUNTER — Other Ambulatory Visit: Payer: Self-pay

## 2020-11-10 DIAGNOSIS — M4726 Other spondylosis with radiculopathy, lumbar region: Secondary | ICD-10-CM

## 2020-11-10 MED ORDER — IOPAMIDOL (ISOVUE-M 200) INJECTION 41%
1.0000 mL | Freq: Once | INTRAMUSCULAR | Status: AC
  Administered 2020-11-10: 1 mL via EPIDURAL

## 2020-11-10 MED ORDER — METHYLPREDNISOLONE ACETATE 40 MG/ML INJ SUSP (RADIOLOG
120.0000 mg | Freq: Once | INTRAMUSCULAR | Status: AC
  Administered 2020-11-10: 120 mg via EPIDURAL

## 2020-11-10 NOTE — Discharge Instructions (Signed)

## 2020-11-30 ENCOUNTER — Ambulatory Visit
Admission: RE | Admit: 2020-11-30 | Discharge: 2020-11-30 | Disposition: A | Discharge status: Home / Self Care | Payer: BC Managed Care – PPO | Source: Ambulatory Visit | Attending: Physician Assistant | Admitting: Physician Assistant

## 2020-11-30 ENCOUNTER — Other Ambulatory Visit: Payer: Self-pay | Admitting: Physician Assistant

## 2020-11-30 DIAGNOSIS — M1710 Unilateral primary osteoarthritis, unspecified knee: Secondary | ICD-10-CM

## 2021-01-29 ENCOUNTER — Ambulatory Visit: Payer: BC Managed Care – PPO | Admitting: Orthopaedic Surgery

## 2021-02-08 ENCOUNTER — Ambulatory Visit: Payer: BC Managed Care – PPO | Admitting: Orthopaedic Surgery

## 2021-02-13 ENCOUNTER — Ambulatory Visit: Payer: BC Managed Care – PPO | Admitting: Orthopaedic Surgery

## 2021-08-09 DIAGNOSIS — M1712 Unilateral primary osteoarthritis, left knee: Secondary | ICD-10-CM | POA: Diagnosis not present

## 2021-08-16 DIAGNOSIS — M1712 Unilateral primary osteoarthritis, left knee: Secondary | ICD-10-CM | POA: Diagnosis not present

## 2021-08-16 DIAGNOSIS — G500 Trigeminal neuralgia: Secondary | ICD-10-CM | POA: Diagnosis not present

## 2021-08-16 DIAGNOSIS — M4726 Other spondylosis with radiculopathy, lumbar region: Secondary | ICD-10-CM | POA: Diagnosis not present

## 2021-08-16 DIAGNOSIS — G894 Chronic pain syndrome: Secondary | ICD-10-CM | POA: Diagnosis not present

## 2021-08-17 ENCOUNTER — Other Ambulatory Visit: Payer: Self-pay

## 2021-08-17 ENCOUNTER — Encounter (HOSPITAL_COMMUNITY): Payer: Self-pay | Admitting: *Deleted

## 2021-08-17 ENCOUNTER — Ambulatory Visit (HOSPITAL_COMMUNITY)
Admission: EM | Admit: 2021-08-17 | Discharge: 2021-08-17 | Disposition: A | Discharge status: Home / Self Care | Payer: BC Managed Care – PPO | Attending: Physician Assistant | Admitting: Physician Assistant

## 2021-08-17 DIAGNOSIS — G8929 Other chronic pain: Secondary | ICD-10-CM

## 2021-08-17 DIAGNOSIS — M5442 Lumbago with sciatica, left side: Secondary | ICD-10-CM

## 2021-08-17 DIAGNOSIS — J0100 Acute maxillary sinusitis, unspecified: Secondary | ICD-10-CM

## 2021-08-17 MED ORDER — METHYLPREDNISOLONE ACETATE 80 MG/ML IJ SUSP
INTRAMUSCULAR | Status: AC
  Filled 2021-08-17: qty 1

## 2021-08-17 MED ORDER — AMOXICILLIN-POT CLAVULANATE 875-125 MG PO TABS
1.0000 | ORAL_TABLET | Freq: Two times a day (BID) | ORAL | 0 refills | Status: DC
Start: 2021-08-17 — End: 2023-03-23

## 2021-08-17 MED ORDER — METHYLPREDNISOLONE ACETATE 80 MG/ML IJ SUSP
60.0000 mg | Freq: Once | INTRAMUSCULAR | Status: AC
  Administered 2021-08-17: 60 mg via INTRAMUSCULAR

## 2021-08-17 NOTE — ED Triage Notes (Signed)
Pt reports Neg Home COVID test x 4 . For 3 weeks Pt reports Ha ,sore throat and cough. Sx's come and go. Pt also reports lt hip,backpain.

## 2021-08-17 NOTE — Discharge Instructions (Addendum)
We gave you an injection of steroids to help with your symptoms today.  Please continue your prescribed medication for pain relief.  Follow-up with your primary care provider if symptoms or not improving.  If you have any worsening symptoms including severe pain, bowel/bladder incontinence or trouble going to the bathroom, numbness in the inside of your legs, leg weakness you need to go to the emergency room.  We are going to start antibiotic to cover for sinus/bronchitis infection.  Take Augmentin twice daily for 7 days.  Use Mucinex, Flonase, Tylenol for additional symptom relief.  Make sure you rest and drink plenty of fluid.  If your symptoms or not improving within a week please return here see your PCP.  If you have any worsening symptoms including high fever, nausea/vomiting, chest pain, shortness of breath you need to be seen immediately.

## 2021-08-17 NOTE — ED Provider Notes (Signed)
[place]    CSN: [id number] Arrival date & time: 08/17/21  1021      History   Chief Complaint Chief Complaint  Patient presents with   Nasal Congestion   Cough   Headache    HPI Phyllis Calhoun is a 46 y.o. female.   Patient presents today for 3-week history of intermittent URI symptoms.  Reports sinus congestion, nasal congestion, sore throat, drainage, cough.  Denies fever, chest pain, shortness of breath, nausea, vomiting.  She has tried multiple over-the-counter medications including Coricidin, Tylenol, Mucinex without improvement of symptoms.  She is up-to-date on COVID-19 vaccine but has not had flu test.  She has taken multiple COVID test during the course of illness at home that have all been negative.  She does report that her grandchildren have had minor URIs but they get better and her symptoms have persisted.  She denies any history of asthma, allergies, smoking.  She denies any recent antibiotic use.  Denies any concern for pregnancy.  Denies history of diabetes.  In addition, patient reports a history of left-sided sciatica.  This has been ongoing for 1.5 years intermittently.  She denies any known injury or increase in activity prior to symptom onset.  Currently pain is rated 7 on a 0-10 pain scale, localized to left buttocks area with radiation to left leg, described as intense aching, no aggravating relieving factors identified.  She is prescribed hydrocodone, gabapentin, meloxicam and has been taking these medications as prescribed without missing doses or noted side effects.  Reports she is previously done well with steroid injection and wondering if this is a possibility today.  Denies any bowel/bladder continence, lower extremity weakness, saddle anesthesia.   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   Loud snoring 11/05/2018   OSA (obstructive sleep apnea) 11/05/2018   Chest pain 01/20/2016   Trigeminal neuralgia of right side of face 01/20/2016   Shortness of breath 01/20/2016   Acute medial meniscus tear of left knee 12/29/2015   Acute lateral meniscus tear of left knee 12/29/2015   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      Home Medications    Prior to Admission medications   Medication Sig Start Date End Date Taking? Authorizing Provider  amoxicillin-clavulanate (AUGMENTIN) 875-125 MG tablet Take 1 tablet by mouth every 12 (twelve) hours. 08/17/21  Yes Deashia Soule, Derry Skill, PA-C  acetaminophen (TYLENOL) 500 MG tablet Take 1,000 mg by mouth every 6 (six) hours  as needed for pain. Pain.    [provider]  diclofenac sodium (VOLTAREN) 1 % GEL APPLY 4 GRAMS 2 TO 3 TIMES DAILY AS NEEDED 10/28/18   [provider]  Fremanezumab-vfrm (AJOVY) 225 MG/1.5ML SOSY Inject 225 mg into the skin every 30 (thirty) days. 11/04/18   Melvenia Beam, MD  gabapentin (NEURONTIN) 100 MG capsule Take 1 capsule (100 mg total) by mouth 3 (three) times daily. 06/02/19   Lomax, Amy, NP   HYDROcodone-acetaminophen (NORCO) 10-325 MG tablet Take 1 tablet by mouth every 6 (six) hours as needed for moderate pain.    [provider]  meloxicam (MOBIC) 15 MG tablet Take 15 mg by mouth daily.    [provider]  methocarbamol (ROBAXIN) 750 MG tablet Take 750 mg by mouth every 8 (eight) hours as needed for muscle spasms. 10/16/16   [provider]  ondansetron (ZOFRAN ODT) 4 MG disintegrating tablet Take 1 tablet (4 mg total) by mouth every 8 (eight) hours as needed for nausea or vomiting. 01/18/20   Loura Halt A, NP  phentermine (ADIPEX-P) 37.5 MG tablet Take 37.5 mg by mouth daily. 10/25/18   [provider]  SUMAtriptan (IMITREX) 100 MG tablet Take 100 mg by mouth every 2 (two) hours as needed for migraine or headache. May repeat in 2 hours if headache persists or recurs.    [provider]  tiZANidine (ZANAFLEX) 4 MG tablet Take 1 tablet (4 mg total) by mouth every 8 (eight) hours as needed. 06/24/20   Jaynee Eagles, PA-C  topiramate (TOPAMAX) 100 MG tablet Take 1 tablet (100 mg total) by mouth 2 (two) times daily. 01/19/20   Melvenia Beam, MD  Vitamin D, Ergocalciferol, (DRISDOL) 1.25 MG (50000 UT) CAPS capsule Take 50,000 Units by mouth once a week. 09/14/18   [provider]    Family History Family History  Problem Relation Age of Onset   Migraines Neg Hx     Social History Social History   Tobacco Use   Smoking status: Never   Smokeless tobacco: Never  Vaping Use   Vaping Use: Never used  Substance Use Topics   Alcohol use: No   Drug use: No     Allergies   Patient has no known allergies.   Review of Systems Review of Systems  Constitutional:  Positive for activity change. Negative for appetite change, fatigue and fever.  HENT:  Positive for congestion, postnasal drip, sinus pressure and sore throat. Negative for sneezing.   Respiratory:  Positive for cough. Negative for shortness of breath.    Cardiovascular:  Negative for chest pain.  Gastrointestinal:  Negative for abdominal pain, diarrhea, nausea and vomiting.  Musculoskeletal:  Positive for back pain. Negative for arthralgias and myalgias.  Neurological:  Positive for headaches. Negative for dizziness and light-headedness.    Physical Exam Triage Vital Signs ED Triage Vitals  Enc Vitals Group     BP 08/17/21 1111 131/84     Pulse Rate 08/17/21 1111 73     Resp 08/17/21 1111 20     Temp 08/17/21 1111 98.5 F (36.9 C)     Temp src --      SpO2 --      Weight --      Height --      Head Circumference --      Peak Flow --      Pain Score 08/17/21 1108 7     Pain Loc --  Pain Edu? --      Excl. in [place]? --    No data found.  Updated Vital Signs BP 131/84    Pulse 73    Temp 98.5 F (36.9 C)    Resp 20    SpO2 95%   Visual Acuity Right Eye Distance:   Left Eye Distance:   Bilateral Distance:    Right Eye Near:   Left Eye Near:    Bilateral Near:     Physical Exam Vitals reviewed.  Constitutional:      General: She is awake. She is not in acute distress.    Appearance: Normal appearance. She is well-developed. She is not ill-appearing.     Comments: Very pleasant female appears stated age in no acute distress sitting comfortably in exam room  HENT:     Head: Normocephalic and atraumatic.     Right Ear: Tympanic membrane, ear canal and external ear normal. Tympanic membrane is not erythematous or bulging.     Left Ear: Tympanic membrane, ear canal and external ear normal. Tympanic membrane is not erythematous or bulging.     Nose:     Right Sinus: Maxillary sinus tenderness present. No frontal sinus tenderness.     Left Sinus: Maxillary sinus tenderness present. No frontal sinus tenderness.     Mouth/Throat:     Pharynx: Uvula midline. Posterior oropharyngeal erythema present. No oropharyngeal exudate.     Comments: Erythema and drainage in posterior pharynx Cardiovascular:     Rate and Rhythm:  Normal rate and regular rhythm.     Heart sounds: Normal heart sounds, S1 normal and S2 normal. No murmur heard. Pulmonary:     Effort: Pulmonary effort is normal.     Breath sounds: Normal breath sounds. No wheezing, rhonchi or rales.     Comments: Clear to auscultation bilaterally Musculoskeletal:     Cervical back: No tenderness or bony tenderness.     Thoracic back: No tenderness or bony tenderness.     Lumbar back: Tenderness present. No bony tenderness.     Comments: Mild tenderness palpation over left lumbar paraspinal muscles.  No deformity or step-off noted.  No pain percussion of vertebrae.  Strength 4/5 bilateral lower extremities.  Psychiatric:        Behavior: Behavior is cooperative.     UC Treatments / Results  Labs (all labs ordered are listed, but only abnormal results are displayed) Labs Reviewed - No data to display  EKG   Radiology No results found.  Procedures Procedures (including critical care time)  Medications Ordered in UC Medications  methylPREDNISolone acetate (DEPO-MEDROL) injection 60 mg (60 mg Intramuscular Given 08/17/21 1159)    Initial Impression / Assessment and Plan / UC Course  I have reviewed the triage vital signs and the nursing notes.  Pertinent labs & imaging results that were available during my care of the patient were reviewed by me and considered in my medical decision making (see chart for details).     No indication for viral testing given patient has been symptomatic for several weeks and this would not change management.  Lungs are clear to auscultation and oxygen saturation is 95% so x-ray was deferred.  Given prolonged and worsening symptoms we will start Augmentin twice daily.  Patient was given an injection of Depo-Medrol in clinic to help manage her symptoms.  Recommend she use over-the-counter medication for additional symptom relief.  Discussed that symptoms are not improving within a week she needs to be reevaluated  by  either our clinic or PCP.  Discussed alarm symptoms that warrant emergent evaluation.  Return precautions given to which she expressed understanding.  Plain films of lumbar spine were deferred as patient has no alarm symptoms and denies any recent trauma.  She was encouraged to continue medication regimen as previously prescribed.  She was given Depo-Medrol in clinic in the hopes that this will provide relief of congestion as well as back pain symptoms.  Recommended follow-up with PCP/specialist for further evaluation.  Discussed that if she has any worsening symptoms including lower extremity weakness, increased pain, bowel/bladder incontinence or retention, paresthesias she needs to go to the emergency room.  Strict return precautions given to which she expressed understanding.  Final Clinical Impressions(s) / UC Diagnoses   Final diagnoses:  Acute non-recurrent maxillary sinusitis  Chronic left-sided low back pain with left-sided sciatica     Discharge Instructions      We gave you an injection of steroids to help with your symptoms today.  Please continue your prescribed medication for pain relief.  Follow-up with your primary care provider if symptoms or not improving.  If you have any worsening symptoms including severe pain, bowel/bladder incontinence or trouble going to the bathroom, numbness in the inside of your legs, leg weakness you need to go to the emergency room.  We are going to start antibiotic to cover for sinus/bronchitis infection.  Take Augmentin twice daily for 7 days.  Use Mucinex, Flonase, Tylenol for additional symptom relief.  Make sure you rest and drink plenty of fluid.  If your symptoms or not improving within a week please return here see your PCP.  If you have any worsening symptoms including high fever, nausea/vomiting, chest pain, shortness of breath you need to be seen immediately.     ED Prescriptions     Medication Sig Dispense Auth. Provider    amoxicillin-clavulanate (AUGMENTIN) 875-125 MG tablet Take 1 tablet by mouth every 12 (twelve) hours. 14 tablet Dalaysia Harms, Derry Skill, PA-C      PDMP not reviewed this encounter.   Terrilee Croak, PA-C 08/17/21 1248

## 2021-08-22 ENCOUNTER — Encounter (HOSPITAL_BASED_OUTPATIENT_CLINIC_OR_DEPARTMENT_OTHER): Payer: Self-pay

## 2021-08-22 ENCOUNTER — Emergency Department (HOSPITAL_BASED_OUTPATIENT_CLINIC_OR_DEPARTMENT_OTHER)
Admission: EM | Admit: 2021-08-22 | Discharge: 2021-08-22 | Disposition: A | Discharge status: Home / Self Care | Payer: BC Managed Care – PPO | Attending: Emergency Medicine | Admitting: Emergency Medicine

## 2021-08-22 ENCOUNTER — Emergency Department (HOSPITAL_BASED_OUTPATIENT_CLINIC_OR_DEPARTMENT_OTHER): Payer: BC Managed Care – PPO

## 2021-08-22 ENCOUNTER — Other Ambulatory Visit: Payer: Self-pay

## 2021-08-22 DIAGNOSIS — R059 Cough, unspecified: Secondary | ICD-10-CM | POA: Diagnosis not present

## 2021-08-22 DIAGNOSIS — R0602 Shortness of breath: Secondary | ICD-10-CM | POA: Diagnosis not present

## 2021-08-22 DIAGNOSIS — U071 COVID-19: Secondary | ICD-10-CM | POA: Diagnosis not present

## 2021-08-22 DIAGNOSIS — R0789 Other chest pain: Secondary | ICD-10-CM | POA: Diagnosis not present

## 2021-08-22 DIAGNOSIS — R079 Chest pain, unspecified: Secondary | ICD-10-CM | POA: Diagnosis not present

## 2021-08-22 LAB — COMPREHENSIVE METABOLIC PANEL
ALT: 12 U/L (ref 0–44)
AST: 21 U/L (ref 15–41)
Albumin: 3.7 g/dL (ref 3.5–5.0)
Alkaline Phosphatase: 93 U/L (ref 38–126)
Anion gap: 9 (ref 5–15)
BUN: 13 mg/dL (ref 6–20)
CO2: 24 mmol/L (ref 22–32)
Calcium: 8.6 mg/dL — ABNORMAL LOW (ref 8.9–10.3)
Chloride: 108 mmol/L (ref 98–111)
Creatinine, Ser: 0.92 mg/dL (ref 0.44–1.00)
GFR, Estimated: >60 mL/min (ref >60)
Glucose, Bld: 106 mg/dL — ABNORMAL HIGH (ref 70–99)
Potassium: 3.6 mmol/L (ref 3.5–5.1)
Sodium: 141 mmol/L (ref 135–145)
Total Bilirubin: 0.4 mg/dL (ref 0.3–1.2)
Total Protein: 7.4 g/dL (ref 6.5–8.1)

## 2021-08-22 LAB — RESP PANEL BY RT-PCR (FLU A&B, COVID) ARPGX2
Influenza A by PCR: NEGATIVE
Influenza B by PCR: NEGATIVE
SARS Coronavirus 2 by RT PCR: POSITIVE — AB

## 2021-08-22 LAB — CBC WITH DIFFERENTIAL/PLATELET
Abs Immature Granulocytes: 0.02 10*3/uL (ref 0.00–0.07)
Basophils Absolute: 0 10*3/uL (ref 0.0–0.1)
Basophils Relative: 1 %
Eosinophils Absolute: 0.3 10*3/uL (ref 0.0–0.5)
Eosinophils Relative: 3 %
HCT: 39.9 % (ref 36.0–46.0)
Hemoglobin: 12.2 g/dL (ref 12.0–15.0)
Immature Granulocytes: 0 %
Lymphocytes Relative: 30 %
Lymphs Abs: 2.3 10*3/uL (ref 0.7–4.0)
MCH: 26.3 pg (ref 26.0–34.0)
MCHC: 30.6 g/dL (ref 30.0–36.0)
MCV: 86 fL (ref 80.0–100.0)
Monocytes Absolute: 0.7 10*3/uL (ref 0.1–1.0)
Monocytes Relative: 9 %
Neutro Abs: 4.4 10*3/uL (ref 1.7–7.7)
Neutrophils Relative %: 57 %
Platelets: 294 10*3/uL (ref 150–400)
RBC: 4.64 MIL/uL (ref 3.87–5.11)
RDW: 16 % — ABNORMAL HIGH (ref 11.5–15.5)
WBC: 7.8 10*3/uL (ref 4.0–10.5)
nRBC: 0 % (ref 0.0–0.2)

## 2021-08-22 LAB — TROPONIN I (HIGH SENSITIVITY): Troponin I (High Sensitivity): 3 ng/L (ref <18)

## 2021-08-22 MED ORDER — NIRMATRELVIR/RITONAVIR (PAXLOVID)TABLET: 3.0000 | ORAL_TABLET | Freq: Two times a day (BID) | ORAL | 0 refills | Status: AC

## 2021-08-22 MED ORDER — ALBUTEROL SULFATE HFA 108 (90 BASE) MCG/ACT IN AERS
2.0000 | INHALATION_SPRAY | Freq: Once | RESPIRATORY_TRACT | Status: AC
  Administered 2021-08-22: 2 via RESPIRATORY_TRACT
  Filled 2021-08-22: qty 6.7

## 2021-08-22 NOTE — ED Triage Notes (Signed)
Pt arrives ambulatory to ED with c/o cough X3-4 weeks states that she was seen Friday at [facility]. Pt reports she was given steroid and antibiotic but is now coughing up blood. Pt states that she has also been wheezing.

## 2021-08-22 NOTE — ED Provider Notes (Signed)
Emergency Department Provider Note   I have reviewed the triage vital signs and the nursing notes.   HISTORY  Chief Complaint Cough   HPI Phyllis Calhoun is a 46 y.o. female with past medical history reviewed below presents to the emergency department with continued cough and congestion symptoms.  Her symptoms have developed over the past 5 days.  She was seen in urgent care and prescribed steroid medication along with Augmentin which she has been taking with no relief.  She states in the past 24 hours she has developed some quarter sized globules of clot and sputum.  She is not having pleuritic chest pain.  She describes a diffuse "soreness" in the chest made it much worse with coughing.  She continues to have subjective fever and mild dyspnea.  She is not anticoagulated.  No radiation of symptoms or other modifying factors.   Past Medical History:  Diagnosis Date   Arthritis    both legs   Gallstones    HX OF SEVERAL GALLBLADDER ATTACKS   Headache(784.0)    PT STATES MIGRAINES FROM TRIGEMINAL NEURALGIA   Sleep apnea    sleep study confirmed   Trigeminal neuralgia    2012-PT CONTINUES TO HAVE PAIN RIGHT SIDE OF FACE    Review of Systems  Constitutional: Positive fever/chills and body aches.  Eyes: No visual changes. ENT: Positive sore throat and congestion.  Cardiovascular: Positive chest pain. Respiratory: Mild shortness of breath w/ cough.  Gastrointestinal: No abdominal pain.  No nausea, no vomiting.  No diarrhea.  No constipation. Genitourinary: Negative for dysuria. Musculoskeletal: Negative for back pain. Skin: Negative for rash. Neurological: positive HA    ____________________________________________   PHYSICAL EXAM:  VITAL SIGNS: ED Triage Vitals  Enc Vitals Group     BP 08/22/21 0857 (!) 126/93     Pulse Rate 08/22/21 0857 96     Resp 08/22/21 0857 18     Temp 08/22/21 0857 98.1 F (36.7 C)     Temp Source 08/22/21 0857 Oral     SpO2  08/22/21 0857 92 %     Weight 08/22/21 0902 (!) 420 lb (190.5 kg)     Height 08/22/21 0902 5\' 8"  (1.727 m)   Constitutional: Alert and oriented. Well appearing and in no acute distress. Eyes: Conjunctivae are normal.  Head: Atraumatic. Nose: No congestion/rhinnorhea. Mouth/Throat: Mucous membranes are moist.  Oropharynx with mild erythema. No exudate. No PTA. Clear voice. No trismus.  Neck: No stridor.   Cardiovascular: Normal rate, regular rhythm. Good peripheral circulation. Grossly normal heart sounds.   Respiratory: Normal respiratory effort.  No retractions. Lungs with faint end-expiratory wheeze in all lung fields. No rhonchi. Symmetrical exam. Good air entry.  Gastrointestinal: Soft and nontender. No distention.  Musculoskeletal: No gross deformities of extremities. Neurologic:  Normal speech and language.  Skin:  Skin is warm, dry and intact. No rash noted.  ____________________________________________   LABS (all labs ordered are listed, but only abnormal results are displayed)  Labs Reviewed  RESP PANEL BY RT-PCR (FLU A&B, COVID) ARPGX2 - Abnormal; Notable for the following components:      Result Value   SARS Coronavirus 2 by RT PCR POSITIVE (*)    All other components within normal limits  COMPREHENSIVE METABOLIC PANEL - Abnormal; Notable for the following components:   Glucose, Bld 106 (*)    Calcium 8.6 (*)    All other components within normal limits  CBC WITH DIFFERENTIAL/PLATELET - Abnormal; Notable for the following  components:   RDW 16.0 (*)    All other components within normal limits  TROPONIN I (HIGH SENSITIVITY)   ____________________________________________  EKG   EKG Interpretation  Date/Time:  Wednesday August 22 2021 09:21:43 EST Ventricular Rate:  85 PR Interval:  151 QRS Duration: 83 QT Interval:  374 QTC Calculation: 445 R Axis:   43 Text Interpretation: Sinus rhythm Confirmed by Nanda Quinton [phone]) on 08/22/2021 9:31:27 AM         ____________________________________________  RADIOLOGY  CXR evaluated   ____________________________________________   PROCEDURES  Procedure(s) performed:   Procedures  None ____________________________________________   INITIAL IMPRESSION / ASSESSMENT AND PLAN / ED COURSE  Pertinent labs & imaging results that were available during my care of the patient were reviewed by me and considered in my medical decision making (see chart for details).   This patient is Presenting for Evaluation of URI/Cough, which does require a range of treatment options, and is a complaint that involves a moderate risk of morbidity and mortality.  The Differential Diagnoses include COVID, Flu, CAP, pulmonary edema, ACS.    I decided to review pertinent External Data, and in summary patient with UC visit on 1/13 with URI symptoms at that time.    Clinical Laboratory Tests Ordered, included Troponin which is negative. COVID positive. No anemia or leukocytosis. No AKI.   Radiologic Tests Ordered, included CXR which I independently evaluated. No infiltrate.   Cardiac Monitor Tracing which shows NSR. No ectopy.    Social Determinants of Health Risk non-smoker.   Reevaluation with update and discussion with patient. She is feeling well. Plan for antivirals for COVID.    Medical Decision Making: Summary:  Patient with COVID symptoms likely causing some chest tightness with bronchitis. Considered ACS, PE, dissection, PNX as well but exam/history does not support this. Will treat COVID and defer further chest pain evaluation in this setting but strict ED return precautions discussed.   Disposition: discharge.   ____________________________________________  FINAL CLINICAL IMPRESSION(S) / ED DIAGNOSES  Final diagnoses:  COVID-19     MEDICATIONS GIVEN DURING THIS VISIT:  Medications  albuterol (VENTOLIN HFA) 108 (90 Base) MCG/ACT inhaler 2 puff (2 puffs Inhalation Given 08/22/21 0930)      NEW OUTPATIENT MEDICATIONS STARTED DURING THIS VISIT:  Discharge Medication List as of 08/22/2021 10:34 AM     START taking these medications   Details  nirmatrelvir/ritonavir EUA (PAXLOVID) 20 x 150 MG & 10 x 100MG  TABS Take 3 tablets by mouth 2 (two) times daily for 5 days. Patient GFR is 60. Take nirmatrelvir (150 mg) two tablets twice daily for 5 days and ritonavir (100 mg) one tablet twice daily for 5 days., Starting Wed 08/22/2021, Until Mon 08/27/2021, Normal        Note:  This document was prepared using Dragon voice recognition software and may include unintentional dictation errors.  Nanda Quinton, MD, [facility]    Livy Ross, [facility], MD 08/29/21 Lurena Nida

## 2021-08-22 NOTE — Discharge Instructions (Signed)
You were seen in the emergency room today with worsening flulike symptoms.  You have tested positive for COVID-19.  I am starting you on antiviral medication for the next 5 days.  Please return with any new or suddenly worsening symptoms such as worsening chest pain, trouble breathing, confusion, or dehydration.

## 2021-08-22 NOTE — ED Notes (Signed)
Assessed PT while on room air. BBS Expiratory wheeze (bilateral), Sp02 96%, RR 18, PT states productive, bloody cough. PT does not appear to be in respiratory distress at this time.

## 2021-09-05 DIAGNOSIS — G500 Trigeminal neuralgia: Secondary | ICD-10-CM | POA: Diagnosis not present

## 2021-09-05 DIAGNOSIS — R220 Localized swelling, mass and lump, head: Secondary | ICD-10-CM | POA: Diagnosis not present

## 2021-09-05 DIAGNOSIS — R221 Localized swelling, mass and lump, neck: Secondary | ICD-10-CM | POA: Diagnosis not present

## 2021-09-07 ENCOUNTER — Other Ambulatory Visit: Payer: Self-pay | Admitting: Pain Medicine

## 2021-09-07 DIAGNOSIS — M47816 Spondylosis without myelopathy or radiculopathy, lumbar region: Secondary | ICD-10-CM

## 2021-09-20 ENCOUNTER — Other Ambulatory Visit: Payer: Self-pay | Admitting: Neurosurgery

## 2021-09-20 DIAGNOSIS — R221 Localized swelling, mass and lump, neck: Secondary | ICD-10-CM

## 2021-09-20 DIAGNOSIS — R220 Localized swelling, mass and lump, head: Secondary | ICD-10-CM

## 2021-09-21 ENCOUNTER — Other Ambulatory Visit: Payer: Self-pay | Admitting: Neurosurgery

## 2021-09-21 DIAGNOSIS — R220 Localized swelling, mass and lump, head: Secondary | ICD-10-CM

## 2021-09-21 DIAGNOSIS — R221 Localized swelling, mass and lump, neck: Secondary | ICD-10-CM

## 2021-09-21 DIAGNOSIS — R599 Enlarged lymph nodes, unspecified: Secondary | ICD-10-CM

## 2021-09-24 ENCOUNTER — Ambulatory Visit
Admission: RE | Admit: 2021-09-24 | Discharge: 2021-09-24 | Disposition: A | Discharge status: Home / Self Care | Payer: BC Managed Care – PPO | Source: Ambulatory Visit | Attending: Neurosurgery | Admitting: Neurosurgery

## 2021-09-24 DIAGNOSIS — R220 Localized swelling, mass and lump, head: Secondary | ICD-10-CM

## 2021-09-24 DIAGNOSIS — R221 Localized swelling, mass and lump, neck: Secondary | ICD-10-CM | POA: Diagnosis not present

## 2021-09-24 DIAGNOSIS — R599 Enlarged lymph nodes, unspecified: Secondary | ICD-10-CM

## 2021-09-26 DIAGNOSIS — R519 Headache, unspecified: Secondary | ICD-10-CM | POA: Diagnosis not present

## 2021-09-26 DIAGNOSIS — G500 Trigeminal neuralgia: Secondary | ICD-10-CM | POA: Diagnosis not present

## 2021-09-26 DIAGNOSIS — G43009 Migraine without aura, not intractable, without status migrainosus: Secondary | ICD-10-CM | POA: Diagnosis not present

## 2021-10-02 DIAGNOSIS — Z6841 Body Mass Index (BMI) 40.0 and over, adult: Secondary | ICD-10-CM | POA: Diagnosis not present

## 2021-10-02 DIAGNOSIS — M1712 Unilateral primary osteoarthritis, left knee: Secondary | ICD-10-CM | POA: Diagnosis not present

## 2021-10-02 DIAGNOSIS — E559 Vitamin D deficiency, unspecified: Secondary | ICD-10-CM | POA: Diagnosis not present

## 2021-10-02 DIAGNOSIS — Z131 Encounter for screening for diabetes mellitus: Secondary | ICD-10-CM | POA: Diagnosis not present

## 2021-10-02 DIAGNOSIS — F419 Anxiety disorder, unspecified: Secondary | ICD-10-CM | POA: Diagnosis not present

## 2021-10-02 DIAGNOSIS — Z13220 Encounter for screening for lipoid disorders: Secondary | ICD-10-CM | POA: Diagnosis not present

## 2021-10-10 DIAGNOSIS — Z6841 Body Mass Index (BMI) 40.0 and over, adult: Secondary | ICD-10-CM | POA: Diagnosis not present

## 2021-10-10 DIAGNOSIS — M1712 Unilateral primary osteoarthritis, left knee: Secondary | ICD-10-CM | POA: Diagnosis not present

## 2021-10-10 DIAGNOSIS — M25562 Pain in left knee: Secondary | ICD-10-CM | POA: Diagnosis not present

## 2021-10-12 DIAGNOSIS — R519 Headache, unspecified: Secondary | ICD-10-CM | POA: Diagnosis not present

## 2021-10-13 ENCOUNTER — Emergency Department (HOSPITAL_BASED_OUTPATIENT_CLINIC_OR_DEPARTMENT_OTHER): Payer: BC Managed Care – PPO

## 2021-10-13 ENCOUNTER — Emergency Department (HOSPITAL_BASED_OUTPATIENT_CLINIC_OR_DEPARTMENT_OTHER)
Admission: EM | Admit: 2021-10-13 | Discharge: 2021-10-13 | Disposition: A | Discharge status: Home / Self Care | Payer: BC Managed Care – PPO | Attending: Emergency Medicine | Admitting: Emergency Medicine

## 2021-10-13 ENCOUNTER — Encounter (HOSPITAL_BASED_OUTPATIENT_CLINIC_OR_DEPARTMENT_OTHER): Payer: Self-pay | Admitting: Emergency Medicine

## 2021-10-13 ENCOUNTER — Other Ambulatory Visit: Payer: Self-pay

## 2021-10-13 DIAGNOSIS — M1712 Unilateral primary osteoarthritis, left knee: Secondary | ICD-10-CM | POA: Diagnosis not present

## 2021-10-13 DIAGNOSIS — M25762 Osteophyte, left knee: Secondary | ICD-10-CM | POA: Diagnosis not present

## 2021-10-13 DIAGNOSIS — M25562 Pain in left knee: Secondary | ICD-10-CM | POA: Diagnosis not present

## 2021-10-13 DIAGNOSIS — S8992XA Unspecified injury of left lower leg, initial encounter: Secondary | ICD-10-CM | POA: Diagnosis not present

## 2021-10-13 MED ORDER — MELOXICAM 15 MG PO TABS
15.0000 mg | ORAL_TABLET | Freq: Every day | ORAL | 0 refills | Status: AC
Start: 2021-10-13 — End: ?

## 2021-10-13 NOTE — ED Notes (Signed)
Walking with cane from home, slow and steady. C/o pain with walking in knee, pt given wheelchair.  ?

## 2021-10-13 NOTE — ED Provider Notes (Incomplete)
[facility] EMERGENCY DEPARTMENT Provider Note   CSN: [id number] Arrival date & time: 10/13/21  1112     History {Add pertinent medical, surgical, social history, OB history to HPI:1} Chief Complaint  Patient presents with   Knee Pain    Phyllis Calhoun is a 46 y.o. female.  Patient is a 46 yo female presenting for knee pain. States six days ago she had a steroid injection in that knee and was walking out to her car when she heard a pop trying to get into her vehicle. Admits to left knee pain with ambulation and while bearing weight. Denies fevers, chills, swelling, redness, or warmth.   The history is provided by the patient. No language interpreter was used.  Knee Pain Associated symptoms: no back pain and no fever       Home Medications Prior to Admission medications   Medication Sig Start Date End Date Taking? Authorizing Provider  acetaminophen (TYLENOL) 500 MG tablet Take 1,000 mg by mouth every 6 (six) hours as needed for pain. Pain.    [provider]  amoxicillin-clavulanate (AUGMENTIN) 875-125 MG tablet Take 1 tablet by mouth every 12 (twelve) hours. 08/17/21   Raspet, Noberto Retort, PA-C  diclofenac sodium (VOLTAREN) 1 % GEL APPLY 4 GRAMS 2 TO 3 TIMES DAILY AS NEEDED 10/28/18   [provider]  Fremanezumab-vfrm (AJOVY) 225 MG/1.5ML SOSY Inject 225 mg into the skin every 30 (thirty) days. 11/04/18   Anson Fret, MD  gabapentin (NEURONTIN) 100 MG capsule Take 1 capsule (100 mg total) by mouth 3 (three) times daily. 06/02/19   Lomax, Amy, NP  HYDROcodone-acetaminophen (NORCO) 10-325 MG tablet Take 1 tablet by mouth every 6 (six) hours as needed for moderate pain.    [provider]  meloxicam (MOBIC) 15 MG tablet Take 15 mg by mouth daily.    [provider]  methocarbamol (ROBAXIN) 750 MG tablet Take 750 mg by mouth every 8 (eight) hours as needed for muscle spasms. 10/16/16   [provider]  ondansetron (ZOFRAN ODT)  4 MG disintegrating tablet Take 1 tablet (4 mg total) by mouth every 8 (eight) hours as needed for nausea or vomiting. 01/18/20   Dahlia Byes A, NP  phentermine (ADIPEX-P) 37.5 MG tablet Take 37.5 mg by mouth daily. 10/25/18   [provider]  SUMAtriptan (IMITREX) 100 MG tablet Take 100 mg by mouth every 2 (two) hours as needed for migraine or headache. May repeat in 2 hours if headache persists or recurs.    [provider]  tiZANidine (ZANAFLEX) 4 MG tablet Take 1 tablet (4 mg total) by mouth every 8 (eight) hours as needed. 06/24/20   Wallis Bamberg, PA-C  topiramate (TOPAMAX) 100 MG tablet Take 1 tablet (100 mg total) by mouth 2 (two) times daily. 01/19/20   Anson Fret, MD  Vitamin D, Ergocalciferol, (DRISDOL) 1.25 MG (50000 UT) CAPS capsule Take 50,000 Units by mouth once a week. 09/14/18   [provider]      Allergies    Patient has no known allergies.    Review of Systems   Review of Systems  Constitutional:  Negative for chills and fever.  HENT:  Negative for ear pain and sore throat.   Eyes:  Negative for pain and visual disturbance.  Respiratory:  Negative for cough and shortness of breath.   Cardiovascular:  Negative for chest pain and palpitations.  Gastrointestinal:  Negative for abdominal pain and vomiting.  Genitourinary:  Negative  for dysuria and hematuria.  Musculoskeletal:  Positive for gait problem. Negative for arthralgias and back pain.       Left knee pain   Skin:  Negative for color change and rash.  Neurological:  Negative for seizures and syncope.  All other systems reviewed and are negative.  Physical Exam Updated Vital Signs BP (!) 151/101 (BP Location: Left Arm)    Pulse 87    Temp 97.9 F (36.6 C) (Oral)    Resp 20    Ht 5\' 8"  (1.727 m)    Wt (!) 192.3 kg    SpO2 99%    BMI 64.47 kg/m  Physical Exam Vitals and nursing note reviewed.  Constitutional:      General: She is not in acute distress.    Appearance: She is  well-developed.  HENT:     Head: Normocephalic and atraumatic.  Eyes:     Conjunctiva/sclera: Conjunctivae normal.  Cardiovascular:     Rate and Rhythm: Normal rate and regular rhythm.     Heart sounds: No murmur heard. Pulmonary:     Effort: Pulmonary effort is normal. No respiratory distress.     Breath sounds: Normal breath sounds.  Abdominal:     Palpations: Abdomen is soft.     Tenderness: There is no abdominal tenderness.  Musculoskeletal:        General: No swelling.     Cervical back: Neck supple.     Right knee: No bony tenderness. No tenderness.     Left knee: Bony tenderness present. Tenderness present.  Skin:    General: Skin is warm and dry.     Capillary Refill: Capillary refill takes less than 2 seconds.  Neurological:     Mental Status: She is alert.  Psychiatric:        Mood and Affect: Mood normal.    ED Results / Procedures / Treatments   Labs (all labs ordered are listed, but only abnormal results are displayed) Labs Reviewed - No data to display  EKG None  Radiology DG Knee Complete 4 Views Left  Result Date: 10/13/2021 CLINICAL DATA:  46 year old female with history of knee injury. EXAM: LEFT KNEE - COMPLETE 4+ VIEW COMPARISON:  Left knee radiograph 11/30/2020. FINDINGS: Four views of the left knee demonstrate no acute displaced fracture, subluxation or dislocation. There is joint space narrowing, subchondral sclerosis, subchondral cyst formation and osteophyte formation in a tricompartmental distribution, most severe in the medial and patellofemoral compartments. IMPRESSION: 1. No acute radiographic abnormality of the left knee. 2. 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 Electronically Signed   By: 12/02/2020 M.D.   On: 10/13/2021 12:10    Procedures Procedures  {Document cardiac monitor, telemetry assessment procedure when appropriate:1}  Medications Ordered in ED Medications - No  data to display  ED Course/ Medical Decision Making/ A&P                           Medical Decision Making Amount and/or Complexity of Data Reviewed Radiology: ordered.   1:51 PM 46 yo female presenting for knee pain. Patient is Aox3, no acute distress, afebrile, with stable vitals. Physical exam demonstrates no wounds or signs of infeciton. Tenderness to palpation. No ligamentous laxity.  Patient in no distress and overall condition improved here in the ED. Detailed discussions were had with the patient regarding current findings, and need for close f/u with PCP or on call doctor.  The patient has been instructed to return immediately if the symptoms worsen in any way for re-evaluation. Patient verbalized understanding and is in agreement with current care plan. All questions answered prior to discharge.   {Document critical care time when appropriate:1} {Document review of labs and clinical decision tools ie heart score, Chads2Vasc2 etc:1}  {Document your independent review of radiology images, and any outside records:1} {Document your discussion with family members, caretakers, and with consultants:1} {Document social determinants of health affecting pt's care:1} {Document your decision making why or why not admission, treatments were needed:1} Final Clinical Impression(s) / ED Diagnoses Final diagnoses:  None    Rx / DC Orders ED Discharge Orders     None

## 2021-10-13 NOTE — ED Triage Notes (Signed)
Pt arrives pov with c/o left knee pain when getting in a vehicle x 6 days pta. Also received cortisone shot same day. Took hydrocodone and meloxicam at 0800 with no relief ?

## 2021-10-16 DIAGNOSIS — M25569 Pain in unspecified knee: Secondary | ICD-10-CM | POA: Diagnosis not present

## 2021-11-05 DIAGNOSIS — G43009 Migraine without aura, not intractable, without status migrainosus: Secondary | ICD-10-CM | POA: Diagnosis not present

## 2021-11-05 DIAGNOSIS — G500 Trigeminal neuralgia: Secondary | ICD-10-CM | POA: Diagnosis not present

## 2021-11-13 DIAGNOSIS — M545 Low back pain, unspecified: Secondary | ICD-10-CM | POA: Diagnosis not present

## 2021-11-27 DIAGNOSIS — R7303 Prediabetes: Secondary | ICD-10-CM | POA: Diagnosis not present

## 2021-11-27 DIAGNOSIS — Z6841 Body Mass Index (BMI) 40.0 and over, adult: Secondary | ICD-10-CM | POA: Diagnosis not present

## 2021-11-27 DIAGNOSIS — E8881 Metabolic syndrome: Secondary | ICD-10-CM | POA: Diagnosis not present

## 2021-12-11 DIAGNOSIS — Z79891 Long term (current) use of opiate analgesic: Secondary | ICD-10-CM | POA: Diagnosis not present

## 2021-12-11 DIAGNOSIS — F419 Anxiety disorder, unspecified: Secondary | ICD-10-CM | POA: Diagnosis not present

## 2021-12-11 DIAGNOSIS — G894 Chronic pain syndrome: Secondary | ICD-10-CM | POA: Diagnosis not present

## 2021-12-11 DIAGNOSIS — R569 Unspecified convulsions: Secondary | ICD-10-CM | POA: Diagnosis not present

## 2021-12-11 DIAGNOSIS — E559 Vitamin D deficiency, unspecified: Secondary | ICD-10-CM | POA: Diagnosis not present

## 2021-12-27 DIAGNOSIS — M47816 Spondylosis without myelopathy or radiculopathy, lumbar region: Secondary | ICD-10-CM | POA: Diagnosis not present

## 2021-12-27 DIAGNOSIS — M5416 Radiculopathy, lumbar region: Secondary | ICD-10-CM | POA: Diagnosis not present

## 2021-12-27 DIAGNOSIS — G8929 Other chronic pain: Secondary | ICD-10-CM | POA: Diagnosis not present

## 2021-12-27 DIAGNOSIS — G894 Chronic pain syndrome: Secondary | ICD-10-CM | POA: Diagnosis not present

## 2022-01-02 DIAGNOSIS — F419 Anxiety disorder, unspecified: Secondary | ICD-10-CM | POA: Diagnosis not present

## 2022-01-02 DIAGNOSIS — E8881 Metabolic syndrome: Secondary | ICD-10-CM | POA: Diagnosis not present

## 2022-01-02 DIAGNOSIS — G894 Chronic pain syndrome: Secondary | ICD-10-CM | POA: Diagnosis not present

## 2022-01-02 DIAGNOSIS — Z79891 Long term (current) use of opiate analgesic: Secondary | ICD-10-CM | POA: Diagnosis not present

## 2022-01-02 DIAGNOSIS — M25562 Pain in left knee: Secondary | ICD-10-CM | POA: Diagnosis not present

## 2022-01-02 DIAGNOSIS — Z723 Lack of physical exercise: Secondary | ICD-10-CM | POA: Diagnosis not present

## 2022-01-02 DIAGNOSIS — Z6841 Body Mass Index (BMI) 40.0 and over, adult: Secondary | ICD-10-CM | POA: Diagnosis not present

## 2022-01-07 DIAGNOSIS — M7918 Myalgia, other site: Secondary | ICD-10-CM | POA: Diagnosis not present

## 2022-01-07 DIAGNOSIS — M5416 Radiculopathy, lumbar region: Secondary | ICD-10-CM | POA: Diagnosis not present

## 2022-01-07 DIAGNOSIS — Z6841 Body Mass Index (BMI) 40.0 and over, adult: Secondary | ICD-10-CM | POA: Diagnosis not present

## 2022-01-10 DIAGNOSIS — M25562 Pain in left knee: Secondary | ICD-10-CM | POA: Diagnosis not present

## 2022-01-10 DIAGNOSIS — G894 Chronic pain syndrome: Secondary | ICD-10-CM | POA: Diagnosis not present

## 2022-01-10 DIAGNOSIS — F1120 Opioid dependence, uncomplicated: Secondary | ICD-10-CM | POA: Diagnosis not present

## 2022-01-10 DIAGNOSIS — M5416 Radiculopathy, lumbar region: Secondary | ICD-10-CM | POA: Diagnosis not present

## 2022-01-14 DIAGNOSIS — Z7189 Other specified counseling: Secondary | ICD-10-CM | POA: Diagnosis not present

## 2022-01-14 DIAGNOSIS — F54 Psychological and behavioral factors associated with disorders or diseases classified elsewhere: Secondary | ICD-10-CM | POA: Diagnosis not present

## 2022-01-14 DIAGNOSIS — Z6841 Body Mass Index (BMI) 40.0 and over, adult: Secondary | ICD-10-CM | POA: Diagnosis not present

## 2022-01-28 DIAGNOSIS — G43719 Chronic migraine without aura, intractable, without status migrainosus: Secondary | ICD-10-CM | POA: Diagnosis not present

## 2022-01-28 DIAGNOSIS — G5131 Clonic hemifacial spasm, right: Secondary | ICD-10-CM | POA: Diagnosis not present

## 2022-02-01 DIAGNOSIS — F39 Unspecified mood [affective] disorder: Secondary | ICD-10-CM | POA: Diagnosis not present

## 2022-02-04 DIAGNOSIS — F331 Major depressive disorder, recurrent, moderate: Secondary | ICD-10-CM | POA: Diagnosis not present

## 2022-02-04 DIAGNOSIS — F411 Generalized anxiety disorder: Secondary | ICD-10-CM | POA: Diagnosis not present

## 2022-02-20 DIAGNOSIS — M4726 Other spondylosis with radiculopathy, lumbar region: Secondary | ICD-10-CM | POA: Diagnosis not present

## 2022-02-20 DIAGNOSIS — M5136 Other intervertebral disc degeneration, lumbar region: Secondary | ICD-10-CM | POA: Diagnosis not present

## 2022-02-21 DIAGNOSIS — F411 Generalized anxiety disorder: Secondary | ICD-10-CM | POA: Diagnosis not present

## 2022-02-21 DIAGNOSIS — F331 Major depressive disorder, recurrent, moderate: Secondary | ICD-10-CM | POA: Diagnosis not present

## 2022-03-04 DIAGNOSIS — F331 Major depressive disorder, recurrent, moderate: Secondary | ICD-10-CM | POA: Diagnosis not present

## 2022-03-04 DIAGNOSIS — F411 Generalized anxiety disorder: Secondary | ICD-10-CM | POA: Diagnosis not present

## 2022-03-07 DIAGNOSIS — M170 Bilateral primary osteoarthritis of knee: Secondary | ICD-10-CM | POA: Diagnosis not present

## 2022-03-07 DIAGNOSIS — G8929 Other chronic pain: Secondary | ICD-10-CM | POA: Diagnosis not present

## 2022-03-07 DIAGNOSIS — M25562 Pain in left knee: Secondary | ICD-10-CM | POA: Diagnosis not present

## 2022-03-07 DIAGNOSIS — M25561 Pain in right knee: Secondary | ICD-10-CM | POA: Diagnosis not present

## 2022-03-21 DIAGNOSIS — F411 Generalized anxiety disorder: Secondary | ICD-10-CM | POA: Diagnosis not present

## 2022-03-21 DIAGNOSIS — F331 Major depressive disorder, recurrent, moderate: Secondary | ICD-10-CM | POA: Diagnosis not present

## 2022-03-28 DIAGNOSIS — M25569 Pain in unspecified knee: Secondary | ICD-10-CM | POA: Diagnosis not present

## 2022-03-28 DIAGNOSIS — G500 Trigeminal neuralgia: Secondary | ICD-10-CM | POA: Diagnosis not present

## 2022-03-28 DIAGNOSIS — Z7409 Other reduced mobility: Secondary | ICD-10-CM | POA: Diagnosis not present

## 2022-03-28 DIAGNOSIS — M549 Dorsalgia, unspecified: Secondary | ICD-10-CM | POA: Diagnosis not present

## 2022-04-04 DIAGNOSIS — F331 Major depressive disorder, recurrent, moderate: Secondary | ICD-10-CM | POA: Diagnosis not present

## 2022-04-04 DIAGNOSIS — F411 Generalized anxiety disorder: Secondary | ICD-10-CM | POA: Diagnosis not present

## 2022-04-04 DIAGNOSIS — M549 Dorsalgia, unspecified: Secondary | ICD-10-CM | POA: Diagnosis not present

## 2022-04-10 DIAGNOSIS — M47816 Spondylosis without myelopathy or radiculopathy, lumbar region: Secondary | ICD-10-CM | POA: Diagnosis not present

## 2022-04-15 DIAGNOSIS — F331 Major depressive disorder, recurrent, moderate: Secondary | ICD-10-CM | POA: Diagnosis not present

## 2022-04-15 DIAGNOSIS — F411 Generalized anxiety disorder: Secondary | ICD-10-CM | POA: Diagnosis not present

## 2022-04-21 NOTE — Progress Notes (Deleted)
Cardiology Office Note:    Date:  04/21/2022   ID:  Phyllis Calhoun, DOB [birth year], MRN [id number]  PCP:  Farris Has, MD   [facility] Providers Cardiologist:  None { Click to update primary MD,subspecialty MD or APP then REFRESH:1}    Referring MD: Farris Has, MD   No chief complaint on file. ***  History of Present Illness:    Phyllis Calhoun is a 46 y.o. female is seen at the request of Dr Kateri Plummer for evaluation of chest tightness. She has a history of OSA. Echo in 2017 was unremarkable.   Past Medical History:  Diagnosis Date   Arthritis    both legs   Gallstones    HX OF SEVERAL GALLBLADDER ATTACKS   Headache(784.0)    PT STATES MIGRAINES FROM TRIGEMINAL NEURALGIA   Sleep apnea    sleep study confirmed   Trigeminal neuralgia    2012-PT CONTINUES TO HAVE PAIN RIGHT SIDE OF FACE    Past Surgical History:  Procedure Laterality Date   ABDOMINAL HYSTERECTOMY     BREAST REDUCTION SURGERY     CHOLECYSTECTOMY N/A 05/25/2013   Procedure: LAPAROSCOPIC CHOLECYSTECTOMY WITH INTRAOPERATIVE CHOLANGIOGRAM;  Surgeon: Axel Filler, MD;  Location: [facility];  Service: General;  Laterality: N/A;   KNEE ARTHROSCOPY Left 12/29/2015   Procedure: ARTHROSCOPY KNEE;  Surgeon: Jodi Geralds, MD;  Location: [facility];  Service: Orthopedics;  Laterality: Left;   TONSILLECTOMY      Current Medications: No outpatient medications have been marked as taking for the 04/24/22 encounter (Appointment) with [place], Judea Riches M, MD.     Allergies:   Patient has no known allergies.   Social History   Socioeconomic History   Marital status: Married    Spouse name: Not on file   Number of children: 2   Years of education: Not on file   Highest education level: Some college, no degree  Occupational History   Not on file  Tobacco Use   Smoking status: Never   Smokeless tobacco: Never  Vaping Use   Vaping Use: Never used  Substance and Sexual Activity   Alcohol use: No    Drug use: No   Sexual activity: Not on file  Other Topics Concern   Not on file  Social History Narrative   Lives at home with her husband, parents, and her son   Right handed   Caffeine: mostly water, tea here and there   Social Determinants of Health   Financial Resource Strain: Not on file  Food Insecurity: Not on file  Transportation Needs: Not on file  Physical Activity: Not on file  Stress: Not on file  Social Connections: Not on file     Family History: The patient's ***family history is negative for Migraines.  ROS:   Please see the history of present illness.    *** All other systems reviewed and are negative.  EKGs/Labs/Other Studies Reviewed:    The following studies were reviewed today: Echo 01/21/16: Study Conclusions   - Left ventricle: The cavity size was normal. Systolic function was    normal. The estimated ejection fraction was in the range of 55%    to 60%. Wall motion was normal; there were no regional wall    motion abnormalities. Left ventricular diastolic function    parameters were normal.  - Mitral valve: There was mild regurgitation.  - Left atrium: The atrium was mildly dilated.  - Atrial septum: No defect or patent foramen ovale was identified  EKG:  EKG is *** ordered today.  The ekg ordered today demonstrates ***  Recent Labs: 08/22/2021: ALT 12; BUN 13; Creatinine, Ser 0.92; Hemoglobin 12.2; Platelets 294; Potassium 3.6; Sodium 141  Recent Lipid Panel No results found for: "CHOL", "TRIG", "HDL", "CHOLHDL", "VLDL", "LDLCALC", "LDLDIRECT"   Risk Assessment/Calculations:   {Does this patient have ATRIAL FIBRILLATION?:[phone]}  No BP recorded.  {Refresh Note OR Click here to enter BP  :1}***         Physical Exam:    VS:  There were no vitals taken for this visit.    Wt Readings from Last 3 Encounters:  10/13/21 (!) 424 lb (192.3 kg)  08/22/21 (!) 420 lb (190.5 kg)  08/01/20 (!) 388 lb (176 kg)     GEN: *** Well nourished,  well developed in no acute distress HEENT: Normal NECK: No JVD; No carotid bruits LYMPHATICS: No lymphadenopathy CARDIAC: ***RRR, no murmurs, rubs, gallops RESPIRATORY:  Clear to auscultation without rales, wheezing or rhonchi  ABDOMEN: Soft, non-tender, non-distended MUSCULOSKELETAL:  No edema; No deformity  SKIN: Warm and dry NEUROLOGIC:  Alert and oriented x 3 PSYCHIATRIC:  Normal affect   ASSESSMENT:    No diagnosis found. PLAN:    In order of problems listed above:  ***      {Are you ordering a CV Procedure (e.g. stress test, cath, DCCV, TEE, etc)?   Press F2        :[id number]}    Medication Adjustments/Labs and Tests Ordered: Current medicines are reviewed at length with the patient today.  Concerns regarding medicines are outlined above.  No orders of the defined types were placed in this encounter.  No orders of the defined types were placed in this encounter.   There are no Patient Instructions on file for this visit.   Signed, Brookelin Felber [place], MD  04/21/2022 7:56 AM    [place]

## 2022-04-24 ENCOUNTER — Ambulatory Visit: Payer: BC Managed Care – PPO | Admitting: Cardiology

## 2022-05-01 DIAGNOSIS — M47816 Spondylosis without myelopathy or radiculopathy, lumbar region: Secondary | ICD-10-CM | POA: Diagnosis not present

## 2022-05-02 DIAGNOSIS — F411 Generalized anxiety disorder: Secondary | ICD-10-CM | POA: Diagnosis not present

## 2022-05-02 DIAGNOSIS — F331 Major depressive disorder, recurrent, moderate: Secondary | ICD-10-CM | POA: Diagnosis not present

## 2022-05-10 DIAGNOSIS — Z7982 Long term (current) use of aspirin: Secondary | ICD-10-CM | POA: Diagnosis not present

## 2022-05-10 DIAGNOSIS — Z6841 Body Mass Index (BMI) 40.0 and over, adult: Secondary | ICD-10-CM | POA: Diagnosis not present

## 2022-05-10 DIAGNOSIS — F419 Anxiety disorder, unspecified: Secondary | ICD-10-CM | POA: Diagnosis not present

## 2022-05-10 DIAGNOSIS — G500 Trigeminal neuralgia: Secondary | ICD-10-CM | POA: Diagnosis not present

## 2022-05-10 DIAGNOSIS — M47816 Spondylosis without myelopathy or radiculopathy, lumbar region: Secondary | ICD-10-CM | POA: Diagnosis not present

## 2022-05-10 DIAGNOSIS — M5126 Other intervertebral disc displacement, lumbar region: Secondary | ICD-10-CM | POA: Diagnosis not present

## 2022-05-10 DIAGNOSIS — M1288 Other specific arthropathies, not elsewhere classified, other specified site: Secondary | ICD-10-CM | POA: Diagnosis not present

## 2022-05-10 DIAGNOSIS — M4696 Unspecified inflammatory spondylopathy, lumbar region: Secondary | ICD-10-CM | POA: Diagnosis not present

## 2022-05-10 DIAGNOSIS — Z79899 Other long term (current) drug therapy: Secondary | ICD-10-CM | POA: Diagnosis not present

## 2022-05-10 DIAGNOSIS — M1712 Unilateral primary osteoarthritis, left knee: Secondary | ICD-10-CM | POA: Diagnosis not present

## 2022-05-10 DIAGNOSIS — Z7951 Long term (current) use of inhaled steroids: Secondary | ICD-10-CM | POA: Diagnosis not present

## 2022-05-13 DIAGNOSIS — F411 Generalized anxiety disorder: Secondary | ICD-10-CM | POA: Diagnosis not present

## 2022-05-13 DIAGNOSIS — F331 Major depressive disorder, recurrent, moderate: Secondary | ICD-10-CM | POA: Diagnosis not present

## 2022-05-15 ENCOUNTER — Ambulatory Visit: Payer: BC Managed Care – PPO | Attending: Cardiology | Admitting: Internal Medicine

## 2022-05-15 NOTE — Progress Notes (Deleted)
Cardiology Office Note:    Date:  40/98/1191   ID:  Phyllis Calhoun, DOB 05-Dec-1975, MRN [id number]  PCP:  London Pepper, MD   [place] Providers Cardiologist:  None { Click to update primary MD,subspecialty MD or APP then REFRESH:1}    Referring MD: London Pepper, MD   No chief complaint on file. ***  History of Present Illness:    Phyllis Calhoun is a 46 y.o. female with a hx of OSA on ***, obesity, trigeminal neuralgia, referral for reported intermittent chest tightness.  Past Medical History:  Diagnosis Date   Arthritis    both legs   Gallstones    HX OF SEVERAL GALLBLADDER ATTACKS   Headache(784.0)    PT STATES MIGRAINES FROM TRIGEMINAL NEURALGIA   Sleep apnea    sleep study confirmed   Trigeminal neuralgia    2012-PT CONTINUES TO HAVE PAIN RIGHT SIDE OF FACE    Past Surgical History:  Procedure Laterality Date   ABDOMINAL HYSTERECTOMY     BREAST REDUCTION SURGERY     CHOLECYSTECTOMY N/A 05/25/2013   Procedure: LAPAROSCOPIC CHOLECYSTECTOMY WITH INTRAOPERATIVE CHOLANGIOGRAM;  Surgeon: Ralene Ok, MD;  Location: [facility];  Service: General;  Laterality: N/A;   KNEE ARTHROSCOPY Left 12/29/2015   Procedure: ARTHROSCOPY KNEE;  Surgeon: Dorna Leitz, MD;  Location: [place];  Service: Orthopedics;  Laterality: Left;   TONSILLECTOMY      Current Medications: No outpatient medications have been marked as taking for the 05/15/22 encounter (Appointment) with Janina Mayo, MD.     Allergies:   Patient has no known allergies.   Social History   Socioeconomic History   Marital status: Married    Spouse name: Not on file   Number of children: 2   Years of education: Not on file   Highest education level: Some college, no degree  Occupational History   Not on file  Tobacco Use   Smoking status: Never   Smokeless tobacco: Never  Vaping Use   Vaping Use: Never used  Substance and Sexual Activity   Alcohol use: No   Drug use: No    Sexual activity: Not on file  Other Topics Concern   Not on file  Social History Narrative   Lives at home with her husband, parents, and her son   Right handed   Caffeine: mostly water, tea here and there   Social Determinants of Health   Financial Resource Strain: Not on file  Food Insecurity: Not on file  Transportation Needs: Not on file  Physical Activity: Not on file  Stress: Not on file  Social Connections: Not on file     Family History: The patient's ***family history is negative for Migraines.  ROS:   Please see the history of present illness.    *** All other systems reviewed and are negative.  EKGs/Labs/Other Studies Reviewed:    The following studies were reviewed today: ***  EKG:  EKG is *** ordered today.  The ekg ordered today demonstrates ***  Recent Labs: 08/22/2021: ALT 12; BUN 13; Creatinine, Ser 0.92; Hemoglobin 12.2; Platelets 294; Potassium 3.6; Sodium 141  Recent Lipid Panel No results found for: "CHOL", "TRIG", "HDL", "CHOLHDL", "VLDL", "LDLCALC", "LDLDIRECT"   Risk Assessment/Calculations:   {Does this patient have ATRIAL FIBRILLATION?:[phone]}  No BP recorded.  {Refresh Note OR Click here to enter BP  :1}***         Physical Exam:    VS:  There were no vitals taken for  this visit.    Wt Readings from Last 3 Encounters:  10/13/21 (!) 424 lb (192.3 kg)  08/22/21 (!) 420 lb (190.5 kg)  08/01/20 (!) 388 lb (176 kg)     GEN: *** Well nourished, well developed in no acute distress HEENT: Normal NECK: No JVD; No carotid bruits LYMPHATICS: No lymphadenopathy CARDIAC: ***RRR, no murmurs, rubs, gallops RESPIRATORY:  Clear to auscultation without rales, wheezing or rhonchi  ABDOMEN: Soft, non-tender, non-distended MUSCULOSKELETAL:  No edema; No deformity  SKIN: Warm and dry NEUROLOGIC:  Alert and oriented x 3 PSYCHIATRIC:  Normal affect   ASSESSMENT:    No diagnosis found. PLAN:    In order of problems listed above:  Coronary  CTA***      {Are you ordering a CV Procedure (e.g. stress test, cath, DCCV, TEE, etc)?   Press F2        :[id number]}    Medication Adjustments/Labs and Tests Ordered: Current medicines are reviewed at length with the patient today.  Concerns regarding medicines are outlined above.  No orders of the defined types were placed in this encounter.  No orders of the defined types were placed in this encounter.   There are no Patient Instructions on file for this visit.   Signed, Maisie Fus, MD  05/15/2022 7:39 AM    [facility]

## 2022-05-16 ENCOUNTER — Other Ambulatory Visit: Payer: Self-pay

## 2022-05-16 MED ORDER — HYDROCODONE-ACETAMINOPHEN 10-325 MG PO TABS
1.0000 | ORAL_TABLET | Freq: Four times a day (QID) | ORAL | 0 refills | Status: AC | PRN
  Filled 2022-05-16: qty 120, 30d supply, fill #0

## 2022-05-16 MED ORDER — HYDROCODONE-ACETAMINOPHEN 10-325 MG PO TABS
1.0000 | ORAL_TABLET | Freq: Four times a day (QID) | ORAL | 0 refills | Status: DC | PRN
  Filled 2022-06-17: qty 120, 30d supply, fill #0

## 2022-05-30 DIAGNOSIS — F411 Generalized anxiety disorder: Secondary | ICD-10-CM | POA: Diagnosis not present

## 2022-05-30 DIAGNOSIS — F331 Major depressive disorder, recurrent, moderate: Secondary | ICD-10-CM | POA: Diagnosis not present

## 2022-06-07 DIAGNOSIS — G8929 Other chronic pain: Secondary | ICD-10-CM | POA: Diagnosis not present

## 2022-06-07 DIAGNOSIS — M170 Bilateral primary osteoarthritis of knee: Secondary | ICD-10-CM | POA: Diagnosis not present

## 2022-06-07 DIAGNOSIS — M25561 Pain in right knee: Secondary | ICD-10-CM | POA: Diagnosis not present

## 2022-06-07 DIAGNOSIS — M25562 Pain in left knee: Secondary | ICD-10-CM | POA: Diagnosis not present

## 2022-06-10 DIAGNOSIS — F411 Generalized anxiety disorder: Secondary | ICD-10-CM | POA: Diagnosis not present

## 2022-06-10 DIAGNOSIS — F331 Major depressive disorder, recurrent, moderate: Secondary | ICD-10-CM | POA: Diagnosis not present

## 2022-06-12 ENCOUNTER — Ambulatory Visit: Payer: BC Managed Care – PPO

## 2022-06-13 DIAGNOSIS — F331 Major depressive disorder, recurrent, moderate: Secondary | ICD-10-CM | POA: Diagnosis not present

## 2022-06-13 DIAGNOSIS — F411 Generalized anxiety disorder: Secondary | ICD-10-CM | POA: Diagnosis not present

## 2022-06-17 ENCOUNTER — Other Ambulatory Visit: Payer: Self-pay

## 2022-06-17 MED ORDER — HYDROCODONE-ACETAMINOPHEN 10-325 MG PO TABS
1.0000 | ORAL_TABLET | Freq: Four times a day (QID) | ORAL | 0 refills | Status: AC | PRN
  Filled 2022-07-17: qty 120, 30d supply, fill #0

## 2022-06-17 MED ORDER — HYDROCODONE-ACETAMINOPHEN 10-325 MG PO TABS
1.0000 | ORAL_TABLET | Freq: Four times a day (QID) | ORAL | 0 refills | Status: DC | PRN
  Filled 2022-06-17: qty 120, 30d supply, fill #0

## 2022-06-17 MED ORDER — HYDROCODONE-ACETAMINOPHEN 10-325 MG PO TABS
1.0000 | ORAL_TABLET | Freq: Four times a day (QID) | ORAL | 0 refills | Status: AC | PRN
  Filled 2022-08-16: qty 120, 30d supply, fill #0

## 2022-06-18 DIAGNOSIS — G43719 Chronic migraine without aura, intractable, without status migrainosus: Secondary | ICD-10-CM | POA: Diagnosis not present

## 2022-06-19 ENCOUNTER — Other Ambulatory Visit: Payer: Self-pay

## 2022-07-02 DIAGNOSIS — F319 Bipolar disorder, unspecified: Secondary | ICD-10-CM | POA: Diagnosis not present

## 2022-07-02 DIAGNOSIS — R062 Wheezing: Secondary | ICD-10-CM | POA: Diagnosis not present

## 2022-07-08 DIAGNOSIS — F411 Generalized anxiety disorder: Secondary | ICD-10-CM | POA: Diagnosis not present

## 2022-07-08 DIAGNOSIS — F331 Major depressive disorder, recurrent, moderate: Secondary | ICD-10-CM | POA: Diagnosis not present

## 2022-07-16 ENCOUNTER — Other Ambulatory Visit: Payer: Self-pay

## 2022-07-17 ENCOUNTER — Ambulatory Visit: Payer: BC Managed Care – PPO

## 2022-07-17 ENCOUNTER — Other Ambulatory Visit: Payer: Self-pay

## 2022-07-17 DIAGNOSIS — M6281 Muscle weakness (generalized): Secondary | ICD-10-CM

## 2022-07-17 NOTE — Therapy (Signed)
OUTPATIENT PHYSICAL THERAPY WHEELCHAIR EVALUATION   Patient Name: Phyllis Calhoun MRN: [id number] DOB:1976/07/13, 46 y.o., female Today's Date: 07/17/2022  END OF SESSION:  PT End of Session - 07/17/22 1204     Visit Number 1    Number of Visits 1    PT Start Time 1100    PT Stop Time 1120    PT Time Calculation (min) 20 min    Activity Tolerance Patient tolerated treatment well    Behavior During Therapy WFL for tasks assessed/performed             Past Medical History:  Diagnosis Date   Arthritis    both legs   Gallstones    HX OF SEVERAL GALLBLADDER ATTACKS   Headache(784.0)    PT STATES MIGRAINES FROM TRIGEMINAL NEURALGIA   Sleep apnea    sleep study confirmed   Trigeminal neuralgia    2012-PT CONTINUES TO HAVE PAIN RIGHT SIDE OF FACE   Past Surgical History:  Procedure Laterality Date   ABDOMINAL HYSTERECTOMY     BREAST REDUCTION SURGERY     CHOLECYSTECTOMY N/A 05/25/2013   Procedure: LAPAROSCOPIC CHOLECYSTECTOMY WITH INTRAOPERATIVE CHOLANGIOGRAM;  Surgeon: Axel Filler, MD;  Location: [facility];  Service: General;  Laterality: N/A;   KNEE ARTHROSCOPY Left 12/29/2015   Procedure: ARTHROSCOPY KNEE;  Surgeon: Jodi Geralds, MD;  Location: [facility];  Service: Orthopedics;  Laterality: Left;   TONSILLECTOMY     Patient Active Problem List   Diagnosis Date Noted   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   Loud snoring 11/05/2018   OSA (obstructive sleep apnea) 11/05/2018   Chest pain 01/20/2016   Trigeminal neuralgia of right side of face 01/20/2016   Shortness of breath 01/20/2016   Acute medial meniscus tear of left knee 12/29/2015   Acute lateral meniscus tear of left knee 12/29/2015   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 Farris Has  REFERRING PROVIDER: Farris Has, MD  THERAPY DIAG:  Muscle weakness (generalized)  Rationale for Evaluation and Treatment  Rehabilitation  SUBJECTIVE:                                                                                                                                                                                           SUBJECTIVE STATEMENT: Pt present or wheelchair evaluation. 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 After the surgery, she had lot of swelling in her brain for which she had spinal tap done 2 weeks after that. Since then she has been having back pain and  she had radioablation surgery done to nerves in back to relieve some pain. Back surgery didn't help and they are exploring further options to relieve her back pain. Pt also reports 7 years of chronic bil knee pain. Patient has had multiple cortisone shots in both knees. shots were about a month ago in her knees. Pt also had debridement of her L knee for meniscus. Pt is also exploring weight loss surgery. Pt reports she lives on 2nd floor apartment and doesn't have a way of getting wheelchair in her apartment.  Due to patient's inability get power wheelchair in her house, patient will not qualify for power wheelchair. Pt is in process of moving in future. Pt was recommended to reach back out to her PCP for new w/c order when she moves into new house/apartments that will be accessible with power wheelchair. Pt verbalized understanding.             Kerrie Pleasure, PT 07/17/2022, 12:05 PM    I concur with the above findings and recommendations of the therapist:  Physician name printed:         Physician's signature:      Date:

## 2022-07-18 DIAGNOSIS — M25562 Pain in left knee: Secondary | ICD-10-CM | POA: Diagnosis not present

## 2022-07-18 DIAGNOSIS — E559 Vitamin D deficiency, unspecified: Secondary | ICD-10-CM | POA: Diagnosis not present

## 2022-07-18 DIAGNOSIS — R7303 Prediabetes: Secondary | ICD-10-CM | POA: Diagnosis not present

## 2022-07-18 DIAGNOSIS — G894 Chronic pain syndrome: Secondary | ICD-10-CM | POA: Diagnosis not present

## 2022-07-18 DIAGNOSIS — R079 Chest pain, unspecified: Secondary | ICD-10-CM | POA: Diagnosis not present

## 2022-07-18 DIAGNOSIS — M4726 Other spondylosis with radiculopathy, lumbar region: Secondary | ICD-10-CM | POA: Diagnosis not present

## 2022-07-18 DIAGNOSIS — F419 Anxiety disorder, unspecified: Secondary | ICD-10-CM | POA: Diagnosis not present

## 2022-07-18 DIAGNOSIS — F1120 Opioid dependence, uncomplicated: Secondary | ICD-10-CM | POA: Diagnosis not present

## 2022-07-18 DIAGNOSIS — E88819 Insulin resistance, unspecified: Secondary | ICD-10-CM | POA: Diagnosis not present

## 2022-07-25 DIAGNOSIS — F411 Generalized anxiety disorder: Secondary | ICD-10-CM | POA: Diagnosis not present

## 2022-07-25 DIAGNOSIS — F331 Major depressive disorder, recurrent, moderate: Secondary | ICD-10-CM | POA: Diagnosis not present

## 2022-07-31 DIAGNOSIS — M1711 Unilateral primary osteoarthritis, right knee: Secondary | ICD-10-CM | POA: Diagnosis not present

## 2022-07-31 DIAGNOSIS — G8929 Other chronic pain: Secondary | ICD-10-CM | POA: Diagnosis not present

## 2022-07-31 DIAGNOSIS — M25561 Pain in right knee: Secondary | ICD-10-CM | POA: Diagnosis not present

## 2022-08-06 DIAGNOSIS — F411 Generalized anxiety disorder: Secondary | ICD-10-CM | POA: Diagnosis not present

## 2022-08-06 DIAGNOSIS — F331 Major depressive disorder, recurrent, moderate: Secondary | ICD-10-CM | POA: Diagnosis not present

## 2022-08-13 DIAGNOSIS — G8929 Other chronic pain: Secondary | ICD-10-CM | POA: Diagnosis not present

## 2022-08-13 DIAGNOSIS — F1120 Opioid dependence, uncomplicated: Secondary | ICD-10-CM | POA: Diagnosis not present

## 2022-08-14 DIAGNOSIS — M170 Bilateral primary osteoarthritis of knee: Secondary | ICD-10-CM | POA: Diagnosis not present

## 2022-08-14 DIAGNOSIS — M25561 Pain in right knee: Secondary | ICD-10-CM | POA: Diagnosis not present

## 2022-08-14 DIAGNOSIS — G8929 Other chronic pain: Secondary | ICD-10-CM | POA: Diagnosis not present

## 2022-08-15 DIAGNOSIS — F411 Generalized anxiety disorder: Secondary | ICD-10-CM | POA: Diagnosis not present

## 2022-08-15 DIAGNOSIS — F331 Major depressive disorder, recurrent, moderate: Secondary | ICD-10-CM | POA: Diagnosis not present

## 2022-08-16 ENCOUNTER — Other Ambulatory Visit: Payer: Self-pay

## 2022-08-20 DIAGNOSIS — Z6841 Body Mass Index (BMI) 40.0 and over, adult: Secondary | ICD-10-CM | POA: Diagnosis not present

## 2022-08-20 DIAGNOSIS — F54 Psychological and behavioral factors associated with disorders or diseases classified elsewhere: Secondary | ICD-10-CM | POA: Diagnosis not present

## 2022-08-20 DIAGNOSIS — Z7189 Other specified counseling: Secondary | ICD-10-CM | POA: Diagnosis not present

## 2022-08-22 DIAGNOSIS — Z136 Encounter for screening for cardiovascular disorders: Secondary | ICD-10-CM | POA: Diagnosis not present

## 2022-08-22 DIAGNOSIS — Z6841 Body Mass Index (BMI) 40.0 and over, adult: Secondary | ICD-10-CM | POA: Diagnosis not present

## 2022-08-22 DIAGNOSIS — R7303 Prediabetes: Secondary | ICD-10-CM | POA: Diagnosis not present

## 2022-08-27 DIAGNOSIS — G8929 Other chronic pain: Secondary | ICD-10-CM | POA: Diagnosis not present

## 2022-08-27 DIAGNOSIS — M170 Bilateral primary osteoarthritis of knee: Secondary | ICD-10-CM | POA: Diagnosis not present

## 2022-08-27 DIAGNOSIS — M25561 Pain in right knee: Secondary | ICD-10-CM | POA: Diagnosis not present

## 2022-08-29 DIAGNOSIS — F331 Major depressive disorder, recurrent, moderate: Secondary | ICD-10-CM | POA: Diagnosis not present

## 2022-08-29 DIAGNOSIS — F411 Generalized anxiety disorder: Secondary | ICD-10-CM | POA: Diagnosis not present

## 2022-09-03 DIAGNOSIS — F331 Major depressive disorder, recurrent, moderate: Secondary | ICD-10-CM | POA: Diagnosis not present

## 2022-09-03 DIAGNOSIS — F411 Generalized anxiety disorder: Secondary | ICD-10-CM | POA: Diagnosis not present

## 2022-09-06 DIAGNOSIS — G5131 Clonic hemifacial spasm, right: Secondary | ICD-10-CM | POA: Diagnosis not present

## 2022-09-06 DIAGNOSIS — G43719 Chronic migraine without aura, intractable, without status migrainosus: Secondary | ICD-10-CM | POA: Diagnosis not present

## 2022-09-06 DIAGNOSIS — G500 Trigeminal neuralgia: Secondary | ICD-10-CM | POA: Diagnosis not present

## 2022-09-10 DIAGNOSIS — M25562 Pain in left knee: Secondary | ICD-10-CM | POA: Diagnosis not present

## 2022-09-10 DIAGNOSIS — G8929 Other chronic pain: Secondary | ICD-10-CM | POA: Diagnosis not present

## 2022-09-10 DIAGNOSIS — M170 Bilateral primary osteoarthritis of knee: Secondary | ICD-10-CM | POA: Diagnosis not present

## 2022-09-13 ENCOUNTER — Other Ambulatory Visit: Payer: Self-pay

## 2022-09-13 DIAGNOSIS — B9689 Other specified bacterial agents as the cause of diseases classified elsewhere: Secondary | ICD-10-CM | POA: Diagnosis not present

## 2022-09-13 DIAGNOSIS — J019 Acute sinusitis, unspecified: Secondary | ICD-10-CM | POA: Diagnosis not present

## 2022-09-13 MED ORDER — MELOXICAM 15 MG PO TABS
15.0000 mg | ORAL_TABLET | Freq: Every day | ORAL | 2 refills | Status: AC
  Filled 2022-09-13: qty 30, 30d supply, fill #0
  Filled 2022-10-14 (×2): qty 30, 30d supply, fill #1
  Filled 2022-11-13 (×2): qty 30, 30d supply, fill #2

## 2022-09-13 MED ORDER — HYDROCODONE-ACETAMINOPHEN 10-325 MG PO TABS
1.0000 | ORAL_TABLET | Freq: Four times a day (QID) | ORAL | 0 refills | Status: DC | PRN
  Filled 2022-09-16: qty 120, 30d supply, fill #0

## 2022-09-16 ENCOUNTER — Other Ambulatory Visit: Payer: Self-pay

## 2022-09-17 DIAGNOSIS — M25562 Pain in left knee: Secondary | ICD-10-CM | POA: Diagnosis not present

## 2022-09-17 DIAGNOSIS — M1712 Unilateral primary osteoarthritis, left knee: Secondary | ICD-10-CM | POA: Diagnosis not present

## 2022-09-17 DIAGNOSIS — G8929 Other chronic pain: Secondary | ICD-10-CM | POA: Diagnosis not present

## 2022-09-24 DIAGNOSIS — G8929 Other chronic pain: Secondary | ICD-10-CM | POA: Diagnosis not present

## 2022-09-24 DIAGNOSIS — M25562 Pain in left knee: Secondary | ICD-10-CM | POA: Diagnosis not present

## 2022-09-24 DIAGNOSIS — M1712 Unilateral primary osteoarthritis, left knee: Secondary | ICD-10-CM | POA: Diagnosis not present

## 2022-09-25 ENCOUNTER — Ambulatory Visit: Payer: BC Managed Care – PPO

## 2022-10-01 DIAGNOSIS — F331 Major depressive disorder, recurrent, moderate: Secondary | ICD-10-CM | POA: Diagnosis not present

## 2022-10-01 DIAGNOSIS — F411 Generalized anxiety disorder: Secondary | ICD-10-CM | POA: Diagnosis not present

## 2022-10-08 DIAGNOSIS — Z13220 Encounter for screening for lipoid disorders: Secondary | ICD-10-CM | POA: Diagnosis not present

## 2022-10-08 DIAGNOSIS — Z0000 Encounter for general adult medical examination without abnormal findings: Secondary | ICD-10-CM | POA: Diagnosis not present

## 2022-10-08 DIAGNOSIS — R609 Edema, unspecified: Secondary | ICD-10-CM | POA: Diagnosis not present

## 2022-10-08 DIAGNOSIS — E559 Vitamin D deficiency, unspecified: Secondary | ICD-10-CM | POA: Diagnosis not present

## 2022-10-08 DIAGNOSIS — R7303 Prediabetes: Secondary | ICD-10-CM | POA: Diagnosis not present

## 2022-10-10 DIAGNOSIS — F411 Generalized anxiety disorder: Secondary | ICD-10-CM | POA: Diagnosis not present

## 2022-10-10 DIAGNOSIS — F331 Major depressive disorder, recurrent, moderate: Secondary | ICD-10-CM | POA: Diagnosis not present

## 2022-10-14 ENCOUNTER — Other Ambulatory Visit: Payer: Self-pay

## 2022-10-14 ENCOUNTER — Ambulatory Visit (HOSPITAL_BASED_OUTPATIENT_CLINIC_OR_DEPARTMENT_OTHER): Payer: BC Managed Care – PPO | Attending: Family Medicine | Admitting: Physical Therapy

## 2022-10-14 DIAGNOSIS — M170 Bilateral primary osteoarthritis of knee: Secondary | ICD-10-CM | POA: Insufficient documentation

## 2022-10-14 MED ORDER — HYDROCODONE-ACETAMINOPHEN 10-325 MG PO TABS
1.0000 | ORAL_TABLET | Freq: Four times a day (QID) | ORAL | 0 refills | Status: DC | PRN
  Filled 2022-12-12: qty 120, 30d supply, fill #0

## 2022-10-14 MED ORDER — HYDROCODONE-ACETAMINOPHEN 10-325 MG PO TABS
1.0000 | ORAL_TABLET | Freq: Four times a day (QID) | ORAL | 0 refills | Status: AC | PRN
  Filled 2022-10-16 – 2022-11-14 (×2): qty 120, 30d supply, fill #0

## 2022-10-15 ENCOUNTER — Other Ambulatory Visit: Payer: Self-pay

## 2022-10-15 ENCOUNTER — Other Ambulatory Visit (HOSPITAL_COMMUNITY): Payer: Self-pay

## 2022-10-16 ENCOUNTER — Other Ambulatory Visit: Payer: Self-pay

## 2022-10-16 DIAGNOSIS — Z6841 Body Mass Index (BMI) 40.0 and over, adult: Secondary | ICD-10-CM | POA: Diagnosis not present

## 2022-10-16 DIAGNOSIS — R635 Abnormal weight gain: Secondary | ICD-10-CM | POA: Diagnosis not present

## 2022-10-16 MED ORDER — WEGOVY 0.25 MG/0.5ML ~~LOC~~ SOAJ
SUBCUTANEOUS | 0 refills | Status: AC
  Filled 2022-10-16 – 2023-02-10 (×5): qty 2, 28d supply, fill #0

## 2022-10-16 MED ORDER — WEGOVY 0.5 MG/0.5ML ~~LOC~~ SOAJ
SUBCUTANEOUS | 0 refills | Status: AC
  Filled 2022-11-13: qty 2, 28d supply, fill #0
  Filled 2023-01-09: qty 2, fill #0

## 2022-10-17 ENCOUNTER — Other Ambulatory Visit: Payer: Self-pay

## 2022-10-17 DIAGNOSIS — F331 Major depressive disorder, recurrent, moderate: Secondary | ICD-10-CM | POA: Diagnosis not present

## 2022-10-17 DIAGNOSIS — F411 Generalized anxiety disorder: Secondary | ICD-10-CM | POA: Diagnosis not present

## 2022-10-23 NOTE — Therapy (Signed)
OUTPATIENT PHYSICAL THERAPY LOWER EXTREMITY EVALUATION   Patient Name: Phyllis Calhoun MRN: [id number] DOB:[birth year], 47 y.o., female Today's Date: 10/24/2022  END OF SESSION:  PT End of Session - 10/24/22 1339     Visit Number 1    Number of Visits 12    Date for PT Re-Evaluation 12/19/22    Authorization Type BCBS    PT Start Time 1203    PT Stop Time 1245    PT Time Calculation (min) 42 min    Activity Tolerance Patient tolerated treatment well    Behavior During Therapy WFL for tasks assessed/performed             Past Medical History:  Diagnosis Date   Arthritis    both legs   Gallstones    HX OF SEVERAL GALLBLADDER ATTACKS   Headache(784.0)    PT STATES MIGRAINES FROM TRIGEMINAL NEURALGIA   Sleep apnea    sleep study confirmed   Trigeminal neuralgia    2012-PT CONTINUES TO HAVE PAIN RIGHT SIDE OF FACE   Past Surgical History:  Procedure Laterality Date   ABDOMINAL HYSTERECTOMY     BREAST REDUCTION SURGERY     CHOLECYSTECTOMY N/A 05/25/2013   Procedure: LAPAROSCOPIC CHOLECYSTECTOMY WITH INTRAOPERATIVE CHOLANGIOGRAM;  Surgeon: Ralene Ok, MD;  Location: [facility];  Service: General;  Laterality: N/A;   KNEE ARTHROSCOPY Left 12/29/2015   Procedure: ARTHROSCOPY KNEE;  Surgeon: Dorna Leitz, MD;  Location: [place];  Service: Orthopedics;  Laterality: Left;   TONSILLECTOMY     Patient Active Problem List   Diagnosis Date Noted   Hypoventilation associated with obesity syndrome ([place]) 11/05/2018   Loud snoring 11/05/2018   OSA (obstructive sleep apnea) 11/05/2018   Chest pain 01/20/2016   Trigeminal neuralgia of right side of face 01/20/2016   Shortness of breath 01/20/2016   Acute medial meniscus tear of left knee 12/29/2015   Acute lateral meniscus tear of left knee 12/29/2015   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   Super obesity 12/29/2015    PCP: Saintclair Halsted, FNP  REFERRING PROVIDER: Saintclair Halsted, FNP   REFERRING DIAG: M17.0  (ICD-10-CM) - Bilateral primary osteoarthritis of knee   THERAPY DIAG:  Chronic pain of both knees  Muscle weakness (generalized)  Difficulty in walking, not elsewhere classified  Rationale for Evaluation and Treatment: Rehabilitation  ONSET DATE: >5 yrs  SUBJECTIVE:   SUBJECTIVE STATEMENT: Doing bariatric treatment. Wants TKR working on losing weight. Aug 2022 surgery for trigeminal nueraliga.  Had complications and is still recovering. Using cane.  Has had back surgery 2023 lumbar spine. Knee gel injection x 3 each knees over last couple of months without improvement. Knees become weak with standing then back will start to hurt. Pt reports doing water aerobics back in 2020 lost some weight. Pt reports waiting for appt for ablation in knee.   PERTINENT HISTORY:  PAIN:  Are you having pain? Yes: NPRS scale: current 7/10; worst 7/10 least 5/10 Pain location: knees Pain description: achy when I sleep Aggravating factors: lying, standing, walking x 3 minutes Relieving factors: sitting, resting, heat  PRECAUTIONS: Knee and Fall  WEIGHT BEARING RESTRICTIONS: No  FALLS:  Has patient fallen in last 6 months? No  LIVING ENVIRONMENT: Lives with: lives with their family, lives with their spouse, and mother and father Lives in: House/apartment Stairs: Yes: Internal: 16 steps; on right going up Has following equipment at home: Single point cane and Walker - 2 wheeled  OCCUPATION: disabled  PLOF: Requires  assistive device for independence and Needs assistance with ADLs  PATIENT GOALS: feel better about myself/life, decrease pain  NEXT MD VISIT: next month  OBJECTIVE:   DIAGNOSTIC FINDINGS: none recent  PATIENT SURVEYS:  FOTO Primary measure 26% goal 41%  COGNITION: Overall cognitive status: Within functional limits for tasks assessed     SENSATION: WFL    MUSCLE LENGTH:  POSTURE: No Significant postural limitations identified. Difficult to assess due to body  habitus  PALPATION: No TTP  LOWER EXTREMITY ROM:  Active ROM Right eval Left eval  Hip flexion    Hip extension    Hip abduction    Hip adduction    Hip internal rotation    Hip external rotation    Knee flexion 100 88  Knee extension 0 -5  Ankle dorsiflexion    Ankle plantarflexion    Ankle inversion    Ankle eversion     (Blank rows = not tested)  LOWER EXTREMITY MMT:  MMT Right eval Left eval  Hip flexion 3+ 3+  Hip extension    Hip abduction    Hip adduction    Hip internal rotation    Hip external rotation    Knee flexion 4 4  Knee extension 5 5  Ankle dorsiflexion    Ankle plantarflexion    Ankle inversion    Ankle eversion     (Blank rows = not tested)    FUNCTIONAL TESTS:  Timed up and go (TUG): 21.32   GAIT: Distance walked: 150 ft Assistive device utilized: Single point cane Level of assistance: Modified independence Comments: antalgic, increased lateral displacement due to decreased knee flex bilaterally, decreased time left in stance   TODAY'S TREATMENT:                                                                                                                              Eval Objective testing Pt edu    PATIENT EDUCATION:  Education details: Discussed eval findings, rehab rationale, aquatic program progression/POC and pools in area. Patient is in agreement  Person educated: Patient Education method: Theatre stage manager Education comprehension: verbalized understanding  HOME EXERCISE PROGRAM: Aquatic to be assigned  ASSESSMENT:  CLINICAL IMPRESSION: Patient is a 47 y.o. f who was seen today for physical therapy evaluation and treatment for OA bilat knees.  Pt presents by herself transported to therapy pool via wc.  She amb with cane from parking lot into facility. Pt with long med Hx see above.  She is in process of losing weight to become eligible to have bariatric surgery and TKR. She reports high pain in knees (and LB)  daily with the sensation that they will buckle under her after walking or standing >3 minutes.  MMT indicated hips weaker than knees.  ROM slightly limited in lle knee.  Difficulty to assess hips and LB due to body habitus.  Gait is antalgic but gait speed and cadence wfl.  She will  benefit from skilled PT intervention for strengthening, gait and balance retraining with intent to DC to Indep completion of aquatic HEP at pool she will gain membership for long term management of chronic condition.  She will also benefit from 2 sessions of land based PT for instruction on HEP  OBJECTIVE IMPAIRMENTS: decreased activity tolerance, decreased mobility, difficulty walking, decreased ROM, decreased strength, improper body mechanics, obesity, and pain.   ACTIVITY LIMITATIONS: carrying, lifting, bending, sitting, standing, squatting, sleeping, stairs, and transfers  PARTICIPATION LIMITATIONS: meal prep, cleaning, laundry, shopping, community activity, occupation, and yard work  PERSONAL FACTORS: Fitness, Time since onset of injury/illness/exacerbation, and 3+ comorbidities: see above chart  are also affecting patient's functional outcome.   REHAB POTENTIAL: Good  CLINICAL DECISION MAKING: Evolving/moderate complexity  EVALUATION COMPLEXITY: Moderate   GOALS: Goals reviewed with patient? Yes  SHORT TERM GOALS: Target date: 11/21/22 Pt will tolerate full aquatic sessions consistently without increase in pain and with improving function to demonstrate good toleration and effectiveness of intervention.   Baseline: Goal status: INITIAL  2.  Pt to gain access to pool in order to begin completing aquatic HEP for increased progression of function Baseline: none Goal status: INITIAL  3.  Pt will report decrease in minimal pain to 3/10 or< for improved toleration to activity Baseline: 5/10 Goal status: INITIAL  4.  Pt will improve L knee flex up to or > 100d and extension to 0d for improved  gait. Baseline: see chart Goal status: INITIAL    LONG TERM GOALS: Target date: 12/19/22  Pt to meet stated Foto Goal 41% to demonstrate improved perception of physical ability Baseline: 26 % Goal status: INITIAL  2.  Pt will be indep with final HEP's (land and aquatic as appropriate) for continued management of condition  Baseline: none Goal status: INITIAL  3.  Pt will improve on Tug test to <or= 14s to demonstrate improvement in lower extremity function, mobility and decreased fall risk. Baseline: 21.32 Goal status: INITIAL  4.  Pt will report improvement in ability to stand up to or > 10 minutes Baseline: 3 min Goal status: INITIAL  5.  Pt will improve hip and knee flex strength up to 1 full grade to demonstrate improved overall physical function  Baseline: hips 3+; knee flex 4/5 Goal status: INITIAL   PLAN:  PT FREQUENCY: 1-2x/week  PT DURATION: 8 weeks/12 visits allowing for scheduling conflicts  PLANNED INTERVENTIONS: Therapeutic exercises, Therapeutic activity, Neuromuscular re-education, Balance training, Gait training, Patient/Family education, Self Care, Joint mobilization, Stair training, DME instructions, Aquatic Therapy, Dry Needling, Moist heat, Taping, Ionotophoresis 4mg /ml Dexamethasone, Manual therapy, and Re-evaluation  PLAN FOR NEXT SESSION: Aquatic therapy for general strengthening, balance, ROM/stretching and gait training.Jenita Seashore based for instruction on HEP for LE strengthening   Stanton Kidney Tharon Aquas) Terrionna Bridwell MPT 10/24/2022, 1:42 PM

## 2022-10-24 ENCOUNTER — Encounter (HOSPITAL_BASED_OUTPATIENT_CLINIC_OR_DEPARTMENT_OTHER): Payer: Self-pay | Admitting: Physical Therapy

## 2022-10-24 ENCOUNTER — Other Ambulatory Visit: Payer: Self-pay

## 2022-10-24 ENCOUNTER — Ambulatory Visit (HOSPITAL_BASED_OUTPATIENT_CLINIC_OR_DEPARTMENT_OTHER): Payer: BC Managed Care – PPO | Admitting: Physical Therapy

## 2022-10-24 DIAGNOSIS — R262 Difficulty in walking, not elsewhere classified: Secondary | ICD-10-CM

## 2022-10-24 DIAGNOSIS — M170 Bilateral primary osteoarthritis of knee: Secondary | ICD-10-CM | POA: Diagnosis not present

## 2022-10-24 DIAGNOSIS — M6281 Muscle weakness (generalized): Secondary | ICD-10-CM

## 2022-10-24 DIAGNOSIS — G8929 Other chronic pain: Secondary | ICD-10-CM

## 2022-11-06 DIAGNOSIS — J019 Acute sinusitis, unspecified: Secondary | ICD-10-CM | POA: Diagnosis not present

## 2022-11-06 DIAGNOSIS — B9689 Other specified bacterial agents as the cause of diseases classified elsewhere: Secondary | ICD-10-CM | POA: Diagnosis not present

## 2022-11-06 DIAGNOSIS — J45901 Unspecified asthma with (acute) exacerbation: Secondary | ICD-10-CM | POA: Diagnosis not present

## 2022-11-08 ENCOUNTER — Encounter (HOSPITAL_BASED_OUTPATIENT_CLINIC_OR_DEPARTMENT_OTHER): Payer: Self-pay | Admitting: Physical Therapy

## 2022-11-10 NOTE — Therapy (Signed)
OUTPATIENT PHYSICAL THERAPY LOWER EXTREMITY EVALUATION   Patient Name: Phyllis Calhoun MRN: [id number] DOB:May 12, 1976, 47 y.o., female Today's Date: 11/11/2022  END OF SESSION:  PT End of Session - 11/11/22 0828     Visit Number 2    Number of Visits 12    Date for PT Re-Evaluation 12/19/22    Authorization Type BCBS    PT Start Time 0815    PT Stop Time 0900    PT Time Calculation (min) 45 min    Activity Tolerance Patient tolerated treatment well    Behavior During Therapy WFL for tasks assessed/performed             Past Medical History:  Diagnosis Date   Arthritis    both legs   Gallstones    HX OF SEVERAL GALLBLADDER ATTACKS   Headache(784.0)    PT STATES MIGRAINES FROM TRIGEMINAL NEURALGIA   Sleep apnea    sleep study confirmed   Trigeminal neuralgia    2012-PT CONTINUES TO HAVE PAIN RIGHT SIDE OF FACE   Past Surgical History:  Procedure Laterality Date   ABDOMINAL HYSTERECTOMY     BREAST REDUCTION SURGERY     CHOLECYSTECTOMY N/A 05/25/2013   Procedure: LAPAROSCOPIC CHOLECYSTECTOMY WITH INTRAOPERATIVE CHOLANGIOGRAM;  Surgeon: Axel Filler, MD;  Location: [facility];  Service: General;  Laterality: N/A;   KNEE ARTHROSCOPY Left 12/29/2015   Procedure: ARTHROSCOPY KNEE;  Surgeon: Jodi Geralds, MD;  Location: [facility];  Service: Orthopedics;  Laterality: Left;   TONSILLECTOMY     Patient Active Problem List   Diagnosis Date Noted   Hypoventilation associated with obesity syndrome 11/05/2018   Loud snoring 11/05/2018   OSA (obstructive sleep apnea) 11/05/2018   Chest pain 01/20/2016   Trigeminal neuralgia of right side of face 01/20/2016   Shortness of breath 01/20/2016   Acute medial meniscus tear of left knee 12/29/2015   Acute lateral meniscus tear of left knee 12/29/2015   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   Super obesity 12/29/2015    PCP: Camie Patience, FNP  REFERRING PROVIDER: Camie Patience, FNP   REFERRING DIAG: M17.0 (ICD-10-CM) -  Bilateral primary osteoarthritis of knee   THERAPY DIAG:  Chronic pain of both knees  Muscle weakness (generalized)  Difficulty in walking, not elsewhere classified  Rationale for Evaluation and Treatment: Rehabilitation  ONSET DATE: >5 yrs  SUBJECTIVE:   SUBJECTIVE STATEMENT: "Very first visit.  I am a little nervous"   Doing bariatric treatment. Wants TKR working on losing weight. Aug 2022 surgery for trigeminal nueraliga.  Had complications and is still recovering. Using cane.  Has had back surgery 2023 lumbar spine. Knee gel injection x 3 each knees over last couple of months without improvement. Knees become weak with standing then back will start to hurt. Pt reports doing water aerobics back in 2020 lost some weight. Pt reports waiting for appt for ablation in knee.   PERTINENT HISTORY:  PAIN:  Are you having pain? Yes: NPRS scale: current 8/10; worst 7/10 least 5/10 Pain location: knees Pain description: achy when I sleep Aggravating factors: lying, standing, walking x 3 minutes Relieving factors: sitting, resting, heat  PRECAUTIONS: Knee and Fall  WEIGHT BEARING RESTRICTIONS: No  FALLS:  Has patient fallen in last 6 months? No  LIVING ENVIRONMENT: Lives with: lives with their family, lives with their spouse, and mother and father Lives in: House/apartment Stairs: Yes: Internal: 16 steps; on right going up Has following equipment at home: Single point cane and  Walker - 2 wheeled  OCCUPATION: disabled  PLOF: Requires assistive device for independence and Needs assistance with ADLs  PATIENT GOALS: feel better about myself/life, decrease pain  NEXT MD VISIT: next month  OBJECTIVE:   DIAGNOSTIC FINDINGS: none recent  PATIENT SURVEYS:  FOTO Primary measure 26% goal 41%  COGNITION: Overall cognitive status: Within functional limits for tasks assessed     SENSATION: WFL    MUSCLE LENGTH:  POSTURE: No Significant postural limitations identified.  Difficult to assess due to body habitus  PALPATION: No TTP  LOWER EXTREMITY ROM:  Active ROM Right eval Left eval  Hip flexion    Hip extension    Hip abduction    Hip adduction    Hip internal rotation    Hip external rotation    Knee flexion 100 88  Knee extension 0 -5  Ankle dorsiflexion    Ankle plantarflexion    Ankle inversion    Ankle eversion     (Blank rows = not tested)  LOWER EXTREMITY MMT:  MMT Right eval Left eval  Hip flexion 3+ 3+  Hip extension    Hip abduction    Hip adduction    Hip internal rotation    Hip external rotation    Knee flexion 4 4  Knee extension 5 5  Ankle dorsiflexion    Ankle plantarflexion    Ankle inversion    Ankle eversion     (Blank rows = not tested)    FUNCTIONAL TESTS:  Timed up and go (TUG): 21.32   GAIT: Distance walked: 150 ft Assistive device utilized: Single point cane Level of assistance: Modified independence Comments: antalgic, increased lateral displacement due to decreased knee flex bilaterally, decreased time left in stance   TODAY'S TREATMENT:                                                                                                                              Pt seen for aquatic therapy today.  Treatment took place in water 3.5-4.75 ft in depth at the [facility] pool. Temp of water was 91.  Pt entered/exited the pool via stair using step to pattern with hand rail.  Intro to setting Walking forward and back use support on white barbell gradually submerging from 3.6 to 4.3 *Yellow noodle support anteriorly under arms hand on wall: cycling *UE support yellow noodle: df; pf; high knee marching; add/abd, mini squats in 4.3 x 10-12 reps *SLS 4.3 use support yellow hand noodle *Seated on bench feet on blue step: LAQ x12; cycling  - STS ue support yellow noodle .  Cues for immediate standing balance x 10 gaining position 7/10 x's *Side stepping x 6 lengths.  Pt requires the buoyancy  and hydrostatic pressure of water for support, and to offload joints by unweighting joint load by at least 50 % in navel deep water and by at least 75-80% in chest to neck deep water.  Viscosity of the  water is needed for resistance of strengthening. Water current perturbations provides challenge to standing balance requiring increased core activation.     PATIENT EDUCATION:  Education details: Discussed eval findings, rehab rationale, aquatic program progression/POC and pools in area. Patient is in agreement  Person educated: Patient Education method: Chief Technology Officer Education comprehension: verbalized understanding  HOME EXERCISE PROGRAM: Aquatic to be assigned  ASSESSMENT:  CLINICAL IMPRESSION: Pt able enters pool via steps. She demonstrates indep and safety in setting with therapist instructing  from deck. She is directed through  exercises focused on gentle movement of bilateral knees with decreased loading as tolerated.  She does complain of right shin numbness requiring a seated rest period which does pass.  Pt with difficulty exiting pool via steps as gravity/loading increases.  She is instructed to complete slowly to allow for water to drain off of her clothes. VC for stair climbing leading with RLE as L knee with > pain than right. She is a good candidate for aqutic intervention to progress towards meeting goals.  Initial clinical impression. Patient is a 47 y.o. f who was seen today for physical therapy evaluation and treatment for OA bilat knees.  Pt presents by herself transported to therapy pool via wc.  She amb with cane from parking lot into facility. Pt with long med Hx see above.  She is in process of losing weight to become eligible to have bariatric surgery and TKR. She reports high pain in knees (and LB) daily with the sensation that they will buckle under her after walking or standing >3 minutes.  MMT indicated hips weaker than knees.  ROM slightly limited in lle  knee.  Difficulty to assess hips and LB due to body habitus.  Gait is antalgic but gait speed and cadence wfl.  She will benefit from skilled PT intervention for strengthening, gait and balance retraining with intent to DC to Indep completion of aquatic HEP at pool she will gain membership for long term management of chronic condition.  She will also benefit from 2 sessions of land based PT for instruction on HEP  OBJECTIVE IMPAIRMENTS: decreased activity tolerance, decreased mobility, difficulty walking, decreased ROM, decreased strength, improper body mechanics, obesity, and pain.   ACTIVITY LIMITATIONS: carrying, lifting, bending, sitting, standing, squatting, sleeping, stairs, and transfers  PARTICIPATION LIMITATIONS: meal prep, cleaning, laundry, shopping, community activity, occupation, and yard work  PERSONAL FACTORS: Fitness, Time since onset of injury/illness/exacerbation, and 3+ comorbidities: see above chart  are also affecting patient's functional outcome.   REHAB POTENTIAL: Good  CLINICAL DECISION MAKING: Evolving/moderate complexity  EVALUATION COMPLEXITY: Moderate   GOALS: Goals reviewed with patient? Yes  SHORT TERM GOALS: Target date: 11/21/22 Pt will tolerate full aquatic sessions consistently without increase in pain and with improving function to demonstrate good toleration and effectiveness of intervention.   Baseline: Goal status: INITIAL  2.  Pt to gain access to pool in order to begin completing aquatic HEP for increased progression of function Baseline: none Goal status: INITIAL  3.  Pt will report decrease in minimal pain to 3/10 or< for improved toleration to activity Baseline: 5/10 Goal status: INITIAL  4.  Pt will improve L knee flex up to or > 100d and extension to 0d for improved gait. Baseline: see chart Goal status: INITIAL    LONG TERM GOALS: Target date: 12/19/22  Pt to meet stated Foto Goal 41% to demonstrate improved perception of physical  ability Baseline: 26 % Goal status: INITIAL  2.  Pt will be indep with final HEP's (land and aquatic as appropriate) for continued management of condition  Baseline: none Goal status: INITIAL  3.  Pt will improve on Tug test to <or= 14s to demonstrate improvement in lower extremity function, mobility and decreased fall risk. Baseline: 21.32 Goal status: INITIAL  4.  Pt will report improvement in ability to stand up to or > 10 minutes Baseline: 3 min Goal status: INITIAL  5.  Pt will improve hip and knee flex strength up to 1 full grade to demonstrate improved overall physical function  Baseline: hips 3+; knee flex 4/5 Goal status: INITIAL   PLAN:  PT FREQUENCY: 1-2x/week  PT DURATION: 8 weeks/12 visits allowing for scheduling conflicts  PLANNED INTERVENTIONS: Therapeutic exercises, Therapeutic activity, Neuromuscular re-education, Balance training, Gait training, Patient/Family education, Self Care, Joint mobilization, Stair training, DME instructions, Aquatic Therapy, Dry Needling, Moist heat, Taping, Ionotophoresis 4mg /ml Dexamethasone, Manual therapy, and Re-evaluation  PLAN FOR NEXT SESSION: Aquatic therapy for general strengthening, balance, ROM/stretching and gait training.Hurley Cisco based for instruction on HEP for LE strengthening   Corrie Dandy Tomma Lightning) Sye Schroepfer MPT 11/11/2022, 8:36 AM

## 2022-11-11 ENCOUNTER — Encounter (HOSPITAL_BASED_OUTPATIENT_CLINIC_OR_DEPARTMENT_OTHER): Payer: Self-pay | Admitting: Physical Therapy

## 2022-11-11 ENCOUNTER — Ambulatory Visit (HOSPITAL_BASED_OUTPATIENT_CLINIC_OR_DEPARTMENT_OTHER): Payer: BC Managed Care – PPO | Attending: Family Medicine | Admitting: Physical Therapy

## 2022-11-11 ENCOUNTER — Ambulatory Visit (HOSPITAL_BASED_OUTPATIENT_CLINIC_OR_DEPARTMENT_OTHER): Payer: Self-pay | Admitting: Physical Therapy

## 2022-11-11 DIAGNOSIS — M25561 Pain in right knee: Secondary | ICD-10-CM | POA: Insufficient documentation

## 2022-11-11 DIAGNOSIS — R262 Difficulty in walking, not elsewhere classified: Secondary | ICD-10-CM | POA: Diagnosis not present

## 2022-11-11 DIAGNOSIS — M25562 Pain in left knee: Secondary | ICD-10-CM | POA: Diagnosis not present

## 2022-11-11 DIAGNOSIS — G8929 Other chronic pain: Secondary | ICD-10-CM | POA: Diagnosis not present

## 2022-11-11 DIAGNOSIS — M6281 Muscle weakness (generalized): Secondary | ICD-10-CM | POA: Insufficient documentation

## 2022-11-13 ENCOUNTER — Other Ambulatory Visit: Payer: Self-pay

## 2022-11-13 ENCOUNTER — Encounter (HOSPITAL_BASED_OUTPATIENT_CLINIC_OR_DEPARTMENT_OTHER): Payer: Self-pay | Admitting: Physical Therapy

## 2022-11-13 ENCOUNTER — Ambulatory Visit (HOSPITAL_BASED_OUTPATIENT_CLINIC_OR_DEPARTMENT_OTHER): Payer: BC Managed Care – PPO | Admitting: Physical Therapy

## 2022-11-13 DIAGNOSIS — G8929 Other chronic pain: Secondary | ICD-10-CM

## 2022-11-13 DIAGNOSIS — R262 Difficulty in walking, not elsewhere classified: Secondary | ICD-10-CM

## 2022-11-13 DIAGNOSIS — M25562 Pain in left knee: Secondary | ICD-10-CM | POA: Diagnosis not present

## 2022-11-13 DIAGNOSIS — M6281 Muscle weakness (generalized): Secondary | ICD-10-CM

## 2022-11-13 DIAGNOSIS — M25561 Pain in right knee: Secondary | ICD-10-CM | POA: Diagnosis not present

## 2022-11-13 NOTE — Therapy (Signed)
OUTPATIENT PHYSICAL THERAPY LOWER EXTREMITY TREATMENT   Patient Name: Phyllis Calhoun MRN: [id number] DOB:06-16-1976, 47 y.o., female Today's Date: 11/13/2022  END OF SESSION:  PT End of Session - 11/13/22 0815     Visit Number 3    Number of Visits 12    Date for PT Re-Evaluation 12/19/22    Authorization Type BCBS    PT Start Time [phone]    PT Stop Time 0900    PT Time Calculation (min) 44 min    Activity Tolerance Patient tolerated treatment well    Behavior During Therapy WFL for tasks assessed/performed             Past Medical History:  Diagnosis Date   Arthritis    both legs   Gallstones    HX OF SEVERAL GALLBLADDER ATTACKS   Headache(784.0)    PT STATES MIGRAINES FROM TRIGEMINAL NEURALGIA   Sleep apnea    sleep study confirmed   Trigeminal neuralgia    2012-PT CONTINUES TO HAVE PAIN RIGHT SIDE OF FACE   Past Surgical History:  Procedure Laterality Date   ABDOMINAL HYSTERECTOMY     BREAST REDUCTION SURGERY     CHOLECYSTECTOMY N/A 05/25/2013   Procedure: LAPAROSCOPIC CHOLECYSTECTOMY WITH INTRAOPERATIVE CHOLANGIOGRAM;  Surgeon: Axel Filler, MD;  Location: [facility];  Service: General;  Laterality: N/A;   KNEE ARTHROSCOPY Left 12/29/2015   Procedure: ARTHROSCOPY KNEE;  Surgeon: Jodi Geralds, MD;  Location: [facility];  Service: Orthopedics;  Laterality: Left;   TONSILLECTOMY     Patient Active Problem List   Diagnosis Date Noted   Hypoventilation associated with obesity syndrome 11/05/2018   Loud snoring 11/05/2018   OSA (obstructive sleep apnea) 11/05/2018   Chest pain 01/20/2016   Trigeminal neuralgia of right side of face 01/20/2016   Shortness of breath 01/20/2016   Acute medial meniscus tear of left knee 12/29/2015   Acute lateral meniscus tear of left knee 12/29/2015   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   Super obesity 12/29/2015    PCP: Camie Patience, FNP  REFERRING PROVIDER: Camie Patience, FNP   REFERRING DIAG: M17.0 (ICD-10-CM) -  Bilateral primary osteoarthritis of knee   THERAPY DIAG:  Chronic pain of both knees  Muscle weakness (generalized)  Difficulty in walking, not elsewhere classified  Rationale for Evaluation and Treatment: Rehabilitation  ONSET DATE: >5 yrs  SUBJECTIVE:   SUBJECTIVE STATEMENT: "I was sore after last session in the front and the back of my legs" pointing to calf area.   Doing bariatric treatment. Wants TKR working on losing weight. Aug 2022 surgery for trigeminal nueraliga.  Had complications and is still recovering. Using cane.  Has had back surgery 2023 lumbar spine. Knee gel injection x 3 each knees over last couple of months without improvement. Knees become weak with standing then back will start to hurt. Pt reports doing water aerobics back in 2020 lost some weight. Pt reports waiting for appt for ablation in knee.   PERTINENT HISTORY:  PAIN:  Are you having pain? Yes: NPRS scale: current 8/10; worst 7/10 least 5/10 Pain location: knees Pain description: achy when I sleep Aggravating factors: lying, standing, walking x 3 minutes Relieving factors: sitting, resting, heat  PRECAUTIONS: Knee and Fall  WEIGHT BEARING RESTRICTIONS: No  FALLS:  Has patient fallen in last 6 months? No  LIVING ENVIRONMENT: Lives with: lives with their family, lives with their spouse, and mother and father Lives in: House/apartment Stairs: Yes: Internal: 16 steps; on right going  up Has following equipment at home: Single point cane and Walker - 2 wheeled  OCCUPATION: disabled  PLOF: Requires assistive device for independence and Needs assistance with ADLs  PATIENT GOALS: feel better about myself/life, decrease pain  NEXT MD VISIT: next month  OBJECTIVE:   DIAGNOSTIC FINDINGS: none recent  PATIENT SURVEYS:  FOTO Primary measure 26% goal 41%  COGNITION: Overall cognitive status: Within functional limits for tasks assessed     SENSATION: WFL    MUSCLE LENGTH:  POSTURE: No  Significant postural limitations identified. Difficult to assess due to body habitus  PALPATION: No TTP  LOWER EXTREMITY ROM:  Active ROM Right eval Left eval  Hip flexion    Hip extension    Hip abduction    Hip adduction    Hip internal rotation    Hip external rotation    Knee flexion 100 88  Knee extension 0 -5  Ankle dorsiflexion    Ankle plantarflexion    Ankle inversion    Ankle eversion     (Blank rows = not tested)  LOWER EXTREMITY MMT:  MMT Right eval Left eval  Hip flexion 3+ 3+  Hip extension    Hip abduction    Hip adduction    Hip internal rotation    Hip external rotation    Knee flexion 4 4  Knee extension 5 5  Ankle dorsiflexion    Ankle plantarflexion    Ankle inversion    Ankle eversion     (Blank rows = not tested)    FUNCTIONAL TESTS:  Timed up and go (TUG): 21.32   GAIT: Distance walked: 150 ft Assistive device utilized: Single point cane Level of assistance: Modified independence Comments: antalgic, increased lateral displacement due to decreased knee flex bilaterally, decreased time left in stance   TODAY'S TREATMENT:                                                                                                                              Pt seen for aquatic therapy today.  Treatment took place in water 3.5-4.75 ft in depth at the [facility] pool. Temp of water was 91.  Pt entered/exited the pool via stair using step to pattern with hand rail.   Walking forward and back use support on white barbell gradually submerging from 3.6 to 4.3 *Seated on lift: LAQ and cycling *walking forward and back *Standing UE support barbell: forward, backward jogging; side skipping  - hip flex with knee kick out; 1/2 diamond; pf then PF; hip add/abd; hip flex;  cues for weight shifting and adb bracing for improved balance   - min-squats weight through heels. Cues for focus on target ms as completing. *STS ue support barbell .  Cues  for immediate standing balance x 10 gaining position 7/10 x's   Pt requires the buoyancy and hydrostatic pressure of water for support, and to offload joints by unweighting joint load by at least 50 %  in navel deep water and by at least 75-80% in chest to neck deep water.  Viscosity of the water is needed for resistance of strengthening. Water current perturbations provides challenge to standing balance requiring increased core activation.     PATIENT EDUCATION:  Education details: Discussed eval findings, rehab rationale, aquatic program progression/POC and pools in area. Patient is in agreement  Person educated: Patient Education method: [employer] Technology [employer] Education comprehension: verbalized understanding  HOME EXERCISE PROGRAM: Aquatic to be assigned  ASSESSMENT:  CLINICAL IMPRESSION: She demonstrates indep and safety in setting with therapist instructing  from deck.   Pt reports "something feeling caught" in left knee with initiation of walking submerged.  Relieved with seated cycling.  She had good response to last session other than some gastroc and anterior tib soreness.  She tolerates progression of program well requiring intermittent walking for recovery and VC and demonstration for proper execution. Cues also needed for proper swing with knee and hip flexed.  She is limited some by her body habitus with completion. Goals ongoing      Initial clinical impression. Patient is a 47 y.o. f who was seen today for physical therapy evaluation and treatment for OA bilat knees.  Pt presents by herself transported to therapy pool via wc.  She amb with cane from parking lot into facility. Pt with long med Hx see above.  She is in process of losing weight to become eligible to have bariatric surgery and TKR. She reports high pain in knees (and LB) daily with the sensation that they will buckle under her after walking or standing >3 minutes.  MMT indicated hips weaker than knees.  ROM  slightly limited in lle knee.  Difficulty to assess hips and LB due to body habitus.  Gait is antalgic but gait speed and cadence wfl.  She will benefit from skilled PT intervention for strengthening, gait and balance retraining with intent to DC to Indep completion of aquatic HEP at pool she will gain membership for long term management of chronic condition.  She will also benefit from 2 sessions of land based PT for instruction on HEP  OBJECTIVE IMPAIRMENTS: decreased activity tolerance, decreased mobility, difficulty walking, decreased ROM, decreased strength, improper body mechanics, obesity, and pain.   ACTIVITY LIMITATIONS: carrying, lifting, bending, sitting, standing, squatting, sleeping, stairs, and transfers  PARTICIPATION LIMITATIONS: meal prep, cleaning, laundry, shopping, community activity, occupation, and yard work  PERSONAL FACTORS: Fitness, Time since onset of injury/illness/exacerbation, and 3+ comorbidities: see above chart  are also affecting patient's functional outcome.   REHAB POTENTIAL: Good  CLINICAL DECISION MAKING: Evolving/moderate complexity  EVALUATION COMPLEXITY: Moderate   GOALS: Goals reviewed with patient? Yes  SHORT TERM GOALS: Target date: 11/21/22 Pt will tolerate full aquatic sessions consistently without increase in pain and with improving function to demonstrate good toleration and effectiveness of intervention.   Baseline: Goal status: INITIAL  2.  Pt to gain access to pool in order to begin completing aquatic HEP for increased progression of function Baseline: none Goal status: INITIAL  3.  Pt will report decrease in minimal pain to 3/10 or< for improved toleration to activity Baseline: 5/10 Goal status: INITIAL  4.  Pt will improve L knee flex up to or > 100d and extension to 0d for improved gait. Baseline: see chart Goal status: INITIAL    LONG TERM GOALS: Target date: 12/19/22  Pt to meet stated Foto Goal 41% to demonstrate improved  perception of physical ability Baseline: 26 %  Goal status: INITIAL  2.  Pt will be indep with final HEP's (land and aquatic as appropriate) for continued management of condition  Baseline: none Goal status: INITIAL  3.  Pt will improve on Tug test to <or= 14s to demonstrate improvement in lower extremity function, mobility and decreased fall risk. Baseline: 21.32 Goal status: INITIAL  4.  Pt will report improvement in ability to stand up to or > 10 minutes Baseline: 3 min Goal status: INITIAL  5.  Pt will improve hip and knee flex strength up to 1 full grade to demonstrate improved overall physical function  Baseline: hips 3+; knee flex 4/5 Goal status: INITIAL   PLAN:  PT FREQUENCY: 1-2x/week  PT DURATION: 8 weeks/12 visits allowing for scheduling conflicts  PLANNED INTERVENTIONS: Therapeutic exercises, Therapeutic activity, Neuromuscular re-education, Balance training, Gait training, Patient/Family education, Self Care, Joint mobilization, Stair training, DME instructions, Aquatic Therapy, Dry Needling, Moist heat, Taping, Ionotophoresis 4mg /ml Dexamethasone, Manual therapy, and Re-evaluation  PLAN FOR NEXT SESSION: Aquatic therapy for general strengthening, balance, ROM/stretching and gait training.[employer] based for instruction on HEP for LE strengthening   Corrie Dandy Tomma Lightning) Edgard Debord MPT 11/13/2022, 10:49 AM

## 2022-11-14 ENCOUNTER — Other Ambulatory Visit: Payer: Self-pay

## 2022-11-15 ENCOUNTER — Other Ambulatory Visit: Payer: Self-pay

## 2022-11-20 ENCOUNTER — Ambulatory Visit (HOSPITAL_BASED_OUTPATIENT_CLINIC_OR_DEPARTMENT_OTHER): Payer: BC Managed Care – PPO | Admitting: Physical Therapy

## 2022-11-20 ENCOUNTER — Encounter (HOSPITAL_BASED_OUTPATIENT_CLINIC_OR_DEPARTMENT_OTHER): Payer: Self-pay | Admitting: Physical Therapy

## 2022-11-20 DIAGNOSIS — G8929 Other chronic pain: Secondary | ICD-10-CM | POA: Diagnosis not present

## 2022-11-20 DIAGNOSIS — M6281 Muscle weakness (generalized): Secondary | ICD-10-CM | POA: Diagnosis not present

## 2022-11-20 DIAGNOSIS — R262 Difficulty in walking, not elsewhere classified: Secondary | ICD-10-CM | POA: Diagnosis not present

## 2022-11-20 DIAGNOSIS — M25562 Pain in left knee: Secondary | ICD-10-CM | POA: Diagnosis not present

## 2022-11-20 DIAGNOSIS — M25561 Pain in right knee: Secondary | ICD-10-CM | POA: Diagnosis not present

## 2022-11-20 NOTE — Therapy (Signed)
OUTPATIENT PHYSICAL THERAPY LOWER EXTREMITY TREATMENT   Patient Name: Phyllis Calhoun MRN: [id number] DOB:1976/05/24, 47 y.o., female Today's Date: 11/20/2022  END OF SESSION:  PT End of Session - 11/20/22 1039     Visit Number 4    Number of Visits 12    Date for PT Re-Evaluation 12/19/22    Authorization Type BCBS    PT Start Time 1031    PT Stop Time 1115    PT Time Calculation (min) 44 min    Activity Tolerance Patient tolerated treatment well    Behavior During Therapy WFL for tasks assessed/performed             Past Medical History:  Diagnosis Date   Arthritis    both legs   Gallstones    HX OF SEVERAL GALLBLADDER ATTACKS   Headache(784.0)    PT STATES MIGRAINES FROM TRIGEMINAL NEURALGIA   Sleep apnea    sleep study confirmed   Trigeminal neuralgia    2012-PT CONTINUES TO HAVE PAIN RIGHT SIDE OF FACE   Past Surgical History:  Procedure Laterality Date   ABDOMINAL HYSTERECTOMY     BREAST REDUCTION SURGERY     CHOLECYSTECTOMY N/A 05/25/2013   Procedure: LAPAROSCOPIC CHOLECYSTECTOMY WITH INTRAOPERATIVE CHOLANGIOGRAM;  Surgeon: Axel Filler, MD;  Location: [facility];  Service: General;  Laterality: N/A;   KNEE ARTHROSCOPY Left 12/29/2015   Procedure: ARTHROSCOPY KNEE;  Surgeon: Jodi Geralds, MD;  Location: [facility];  Service: Orthopedics;  Laterality: Left;   TONSILLECTOMY     Patient Active Problem List   Diagnosis Date Noted   Hypoventilation associated with obesity syndrome 11/05/2018   Loud snoring 11/05/2018   OSA (obstructive sleep apnea) 11/05/2018   Chest pain 01/20/2016   Trigeminal neuralgia of right side of face 01/20/2016   Shortness of breath 01/20/2016   Acute medial meniscus tear of left knee 12/29/2015   Acute lateral meniscus tear of left knee 12/29/2015   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   Super obesity 12/29/2015    PCP: Camie Patience, FNP  REFERRING PROVIDER: Camie Patience, FNP   REFERRING DIAG: M17.0 (ICD-10-CM) -  Bilateral primary osteoarthritis of knee   THERAPY DIAG:  Chronic pain of both knees  Muscle weakness (generalized)  Difficulty in walking, not elsewhere classified  Rationale for Evaluation and Treatment: Rehabilitation  ONSET DATE: >5 yrs  SUBJECTIVE:   SUBJECTIVE STATEMENT: "Knees have been real sore over the past few days.   Doing bariatric treatment. Wants TKR working on losing weight. Aug 2022 surgery for trigeminal nueraliga.  Had complications and is still recovering. Using cane.  Has had back surgery 2023 lumbar spine. Knee gel injection x 3 each knees over last couple of months without improvement. Knees become weak with standing then back will start to hurt. Pt reports doing water aerobics back in 2020 lost some weight. Pt reports waiting for appt for ablation in knee.   PERTINENT HISTORY:  PAIN:  Are you having pain? Yes: NPRS scale: current 7/10; worst 7/10 least 5/10 Pain location: knees Pain description: achy when I sleep Aggravating factors: lying, standing, walking x 3 minutes Relieving factors: sitting, resting, heat  PRECAUTIONS: Knee and Fall  WEIGHT BEARING RESTRICTIONS: No  FALLS:  Has patient fallen in last 6 months? No  LIVING ENVIRONMENT: Lives with: lives with their family, lives with their spouse, and mother and father Lives in: House/apartment Stairs: Yes: Internal: 16 steps; on right going up Has following equipment at home: Single point cane  and Walker - 2 wheeled  OCCUPATION: disabled  PLOF: Requires assistive device for independence and Needs assistance with ADLs  PATIENT GOALS: feel better about myself/life, decrease pain  NEXT MD VISIT: next month  OBJECTIVE:   DIAGNOSTIC FINDINGS: none recent  PATIENT SURVEYS:  FOTO Primary measure 26% goal 41%  COGNITION: Overall cognitive status: Within functional limits for tasks assessed     SENSATION: WFL    MUSCLE LENGTH:  POSTURE: No Significant postural limitations  identified. Difficult to assess due to body habitus  PALPATION: No TTP  LOWER EXTREMITY ROM:  Active ROM Right eval Left eval  Hip flexion    Hip extension    Hip abduction    Hip adduction    Hip internal rotation    Hip external rotation    Knee flexion 100 88  Knee extension 0 -5  Ankle dorsiflexion    Ankle plantarflexion    Ankle inversion    Ankle eversion     (Blank rows = not tested)  LOWER EXTREMITY MMT:  MMT Right eval Left eval  Hip flexion 3+ 3+  Hip extension    Hip abduction    Hip adduction    Hip internal rotation    Hip external rotation    Knee flexion 4 4  Knee extension 5 5  Ankle dorsiflexion    Ankle plantarflexion    Ankle inversion    Ankle eversion     (Blank rows = not tested)    FUNCTIONAL TESTS:  Timed up and go (TUG): 21.32   GAIT: Distance walked: 150 ft Assistive device utilized: Single point cane Level of assistance: Modified independence Comments: antalgic, increased lateral displacement due to decreased knee flex bilaterally, decreased time left in stance   TODAY'S TREATMENT:                                                                                                                              Pt seen for aquatic therapy today.  Treatment took place in water 3.5-4.75 ft in depth at the [facility] pool. Temp of water was 91.  Pt entered/exited the pool via stair using step to pattern with hand rail.   Walking forward and back use support on white barbell gradually submerged 4.3 *Suspended up UE support on wall: cycling; add/abd *Standing UE support barbell: forward, backward jogging; side skipping  -  DF then PF; hip add/abd; hip flex;  cues for weight shifting and adb bracing for improved balance  *Standing ue support on wall: 1/2 diamond; hip flex/ext  - min-squats weight through heels.  *STS without ue support.  Cues for immediate standing balance x 10 gaining position 10/10x *standing L  stretch   Pt requires the buoyancy and hydrostatic pressure of water for support, and to offload joints by unweighting joint load by at least 50 % in navel deep water and by at least 75-80% in chest to neck deep water.  Viscosity of  the water is needed for resistance of strengthening. Water current perturbations provides challenge to standing balance requiring increased core activation.  STG's addressed   PATIENT EDUCATION:  Education details: Discussed eval findings, rehab rationale, aquatic program progression/POC and pools in area. Patient is in agreement  Person educated: Patient Education method: Chief Technology Officer Education comprehension: verbalized understanding  HOME EXERCISE PROGRAM: Aquatic to be assigned  ASSESSMENT:  CLINICAL IMPRESSION: She demonstrates indep and safety in setting with therapist instructing  from deck.   Pt reports knee discomfort over the past week.  Tried some of the water exercises at home and was unable to complete due to pain. She will be instructed on land based exercises at first land based appt next visit.  Improved immediate standing balance with weight shift STS completing onto water step without UE support and without LOB. She does need extra ue support with standing exercises today due to left sided slight LBP with lifting rle.  Modified as needed for execution of exercises.  Knee pain decreases throughout session to ~5/10. She reports intent to gain membership soon at pool. Lowest pain continues to be ~5/10. ROM measurements to be (please) measured at land based session (next).      Initial clinical impression. Patient is a 47 y.o. f who was seen today for physical therapy evaluation and treatment for OA bilat knees.  Pt presents by herself transported to therapy pool via wc.  She amb with cane from parking lot into facility. Pt with long med Hx see above.  She is in process of losing weight to become eligible to have bariatric surgery and  TKR. She reports high pain in knees (and LB) daily with the sensation that they will buckle under her after walking or standing >3 minutes.  MMT indicated hips weaker than knees.  ROM slightly limited in lle knee.  Difficulty to assess hips and LB due to body habitus.  Gait is antalgic but gait speed and cadence wfl.  She will benefit from skilled PT intervention for strengthening, gait and balance retraining with intent to DC to Indep completion of aquatic HEP at pool she will gain membership for long term management of chronic condition.  She will also benefit from 2 sessions of land based PT for instruction on HEP  OBJECTIVE IMPAIRMENTS: decreased activity tolerance, decreased mobility, difficulty walking, decreased ROM, decreased strength, improper body mechanics, obesity, and pain.   ACTIVITY LIMITATIONS: carrying, lifting, bending, sitting, standing, squatting, sleeping, stairs, and transfers  PARTICIPATION LIMITATIONS: meal prep, cleaning, laundry, shopping, community activity, occupation, and yard work  PERSONAL FACTORS: Fitness, Time since onset of injury/illness/exacerbation, and 3+ comorbidities: see above chart  are also affecting patient's functional outcome.   REHAB POTENTIAL: Good  CLINICAL DECISION MAKING: Evolving/moderate complexity  EVALUATION COMPLEXITY: Moderate   GOALS: Goals reviewed with patient? Yes  SHORT TERM GOALS: Target date: 11/21/22 Pt will tolerate full aquatic sessions consistently without increase in pain and with improving function to demonstrate good toleration and effectiveness of intervention.   Baseline: Goal status:Met 11/20/22  2.  Pt to gain access to pool in order to begin completing aquatic HEP for increased progression of function Baseline: none Goal status: in progress 11/20/22  3.  Pt will report decrease in minimal pain to 3/10 or< for improved toleration to activity Baseline: 5/10 Goal status: In progress 11/20/22  4.  Pt will improve L  knee flex up to or > 100d and extension to 0d for improved gait. Baseline: see chart  Goal status: INITIAL    LONG TERM GOALS: Target date: 12/19/22  Pt to meet stated Foto Goal 41% to demonstrate improved perception of physical ability Baseline: 26 % Goal status: INITIAL  2.  Pt will be indep with final HEP's (land and aquatic as appropriate) for continued management of condition  Baseline: none Goal status: INITIAL  3.  Pt will improve on Tug test to <or= 14s to demonstrate improvement in lower extremity function, mobility and decreased fall risk. Baseline: 21.32 Goal status: INITIAL  4.  Pt will report improvement in ability to stand up to or > 10 minutes Baseline: 3 min Goal status: INITIAL  5.  Pt will improve hip and knee flex strength up to 1 full grade to demonstrate improved overall physical function  Baseline: hips 3+; knee flex 4/5 Goal status: INITIAL   PLAN:  PT FREQUENCY: 1-2x/week  PT DURATION: 8 weeks/12 visits allowing for scheduling conflicts  PLANNED INTERVENTIONS: Therapeutic exercises, Therapeutic activity, Neuromuscular re-education, Balance training, Gait training, Patient/Family education, Self Care, Joint mobilization, Stair training, DME instructions, Aquatic Therapy, Dry Needling, Moist heat, Taping, Ionotophoresis /ml Dexamethasone, Manual therapy, and Re-evaluation  PLAN FOR NEXT SESSION: Aquatic therapy for general strengthening, balance, ROM/stretching and gait training.[employer] based for instruction on HEP for LE strengthening   Corrie Dandy Tomma Lightning) Chino Sardo MPT 11/20/2022, 10:41 AM

## 2022-11-21 ENCOUNTER — Ambulatory Visit (HOSPITAL_BASED_OUTPATIENT_CLINIC_OR_DEPARTMENT_OTHER): Payer: BC Managed Care – PPO | Admitting: Physical Therapy

## 2022-11-21 ENCOUNTER — Encounter (HOSPITAL_BASED_OUTPATIENT_CLINIC_OR_DEPARTMENT_OTHER): Payer: Self-pay | Admitting: Physical Therapy

## 2022-11-21 DIAGNOSIS — R262 Difficulty in walking, not elsewhere classified: Secondary | ICD-10-CM

## 2022-11-21 DIAGNOSIS — G8929 Other chronic pain: Secondary | ICD-10-CM

## 2022-11-21 DIAGNOSIS — M25561 Pain in right knee: Secondary | ICD-10-CM | POA: Diagnosis not present

## 2022-11-21 DIAGNOSIS — M6281 Muscle weakness (generalized): Secondary | ICD-10-CM

## 2022-11-21 DIAGNOSIS — M25562 Pain in left knee: Secondary | ICD-10-CM | POA: Diagnosis not present

## 2022-11-21 NOTE — Therapy (Signed)
OUTPATIENT PHYSICAL THERAPY LOWER EXTREMITY TREATMENT   Patient Name: Phyllis Calhoun MRN: [id number] DOB:[birth year], 47 y.o., female Today's Date: 11/20/2022  END OF SESSION:  PT End of Session - 11/20/22 1039     Visit Number 4    Number of Visits 12    Date for PT Re-Evaluation 12/19/22    Authorization Type [employer]    PT Start Time 1031    PT Stop Time 1115    PT Time Calculation (min) 44 min    Activity Tolerance Patient tolerated treatment well    Behavior During Therapy WFL for tasks assessed/performed             Past Medical History:  Diagnosis Date   Arthritis    both legs   Gallstones    HX OF SEVERAL GALLBLADDER ATTACKS   Headache(784.0)    PT STATES MIGRAINES FROM TRIGEMINAL NEURALGIA   Sleep apnea    sleep study confirmed   Trigeminal neuralgia    2012-PT CONTINUES TO HAVE PAIN RIGHT SIDE OF FACE   Past Surgical History:  Procedure Laterality Date   ABDOMINAL HYSTERECTOMY     BREAST REDUCTION SURGERY     CHOLECYSTECTOMY N/A 05/25/2013   Procedure: LAPAROSCOPIC CHOLECYSTECTOMY WITH INTRAOPERATIVE CHOLANGIOGRAM;  Surgeon: Axel Filler, MD;  Location: [facility];  Service: General;  Laterality: N/A;   KNEE ARTHROSCOPY Left 12/29/2015   Procedure: ARTHROSCOPY KNEE;  Surgeon: Jodi Geralds, MD;  Location: [facility];  Service: Orthopedics;  Laterality: Left;   TONSILLECTOMY     Patient Active Problem List   Diagnosis Date Noted   Hypoventilation associated with obesity syndrome 11/05/2018   Loud snoring 11/05/2018   OSA (obstructive sleep apnea) 11/05/2018   Chest pain 01/20/2016   Trigeminal neuralgia of right side of face 01/20/2016   Shortness of breath 01/20/2016   Acute medial meniscus tear of left knee 12/29/2015   Acute lateral meniscus tear of left knee 12/29/2015   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   Super obesity 12/29/2015    PCP: Camie Patience, FNP  REFERRING PROVIDER: Camie Patience, FNP   REFERRING DIAG: M17.0 (ICD-10-CM) -  Bilateral primary osteoarthritis of knee   THERAPY DIAG:  Chronic pain of both knees  Muscle weakness (generalized)  Difficulty in walking, not elsewhere classified  Rationale for Evaluation and Treatment: Rehabilitation  ONSET DATE: >5 yrs  SUBJECTIVE:   SUBJECTIVE STATEMENT: Patients reports that she is feeling sore. Her left medial knee is more flared up today. Her initial heart rate at entry is 101 bpm.    Doing bariatric treatment. Wants TKR working on losing weight. Aug 2022 surgery for trigeminal nueraliga.  Had complications and is still recovering. Using cane.  Has had back surgery 2023 lumbar spine. Knee gel injection x 3 each knees over last couple of months without improvement. Knees become weak with standing then back will start to hurt. Pt reports doing water aerobics back in 2020 lost some weight. Pt reports waiting for appt for ablation in knee.   PERTINENT HISTORY:  PAIN:  Are you having pain? Yes: NPRS scale: current 7/10; worst 7/10 least 5/10 Pain location: knees Pain description: achy when I sleep Aggravating factors: lying, standing, walking x 3 minutes Relieving factors: sitting, resting, heat  PRECAUTIONS: Knee and Fall  WEIGHT BEARING RESTRICTIONS: No  FALLS:  Has patient fallen in last 6 months? No  LIVING ENVIRONMENT: Lives with: lives with their family, lives with their spouse, and mother and father Lives in: House/apartment Stairs:  Yes: Internal: 16 steps; on right going up Has following equipment at home: Single point cane and Walker - 2 wheeled  OCCUPATION: disabled  PLOF: Requires assistive device for independence and Needs assistance with ADLs  PATIENT GOALS: feel better about myself/life, decrease pain  NEXT MD VISIT: next month  OBJECTIVE:   DIAGNOSTIC FINDINGS: none recent  PATIENT SURVEYS:  FOTO Primary measure 26% goal 41%  COGNITION: Overall cognitive status: Within functional limits for tasks  assessed     SENSATION: WFL    MUSCLE LENGTH:  POSTURE: No Significant postural limitations identified. Difficult to assess due to body habitus  PALPATION: No TTP  LOWER EXTREMITY ROM:  Active ROM Right eval Left eval  Hip flexion    Hip extension    Hip abduction    Hip adduction    Hip internal rotation    Hip external rotation    Knee flexion 100 88  Knee extension 0 -5  Ankle dorsiflexion    Ankle plantarflexion    Ankle inversion    Ankle eversion     (Blank rows = not tested)  LOWER EXTREMITY MMT:  MMT Right eval Left eval  Hip flexion 3+ 3+  Hip extension    Hip abduction    Hip adduction    Hip internal rotation    Hip external rotation    Knee flexion 4 4  Knee extension 5 5  Ankle dorsiflexion    Ankle plantarflexion    Ankle inversion    Ankle eversion     (Blank rows = not tested)    FUNCTIONAL TESTS:  Timed up and go (TUG): 21.32   GAIT: Distance walked: 150 ft Assistive device utilized: Single point cane Level of assistance: Modified independence Comments: antalgic, increased lateral displacement due to decreased knee flex bilaterally, decreased time left in stance   TODAY'S TREATMENT:        4/18 Nu step 243 total steps 2x2 mins interval HR initial 101 after intervals 105 Ankle pumps 3x15 Quad sets 3x15 Seated hamstring stretch 3x30sec with black strap Ball stretch x5 forward, left and right with red ball                                                                                                                           Last   Pt seen for aquatic therapy today.  Treatment took place in water 3.5-4.75 ft in depth at the [facility] pool. Temp of water was 91.  Pt entered/exited the pool via stair using step to pattern with hand rail.   Walking forward and back use support on white barbell gradually submerged 4.3 *Suspended up UE support on wall: cycling; add/abd *Standing UE support barbell: forward,  backward jogging; side skipping  -  DF then PF; hip add/abd; hip flex;  cues for weight shifting and adb bracing for improved balance  *Standing ue support on wall: 1/2 diamond; hip flex/ext  - min-squats weight through heels.  *STS  without ue support.  Cues for immediate standing balance x 10 gaining position 10/10x *standing L stretch   Pt requires the buoyancy and hydrostatic pressure of water for support, and to offload joints by unweighting joint load by at least 50 % in navel deep water and by at least 75-80% in chest to neck deep water.  Viscosity of the water is needed for resistance of strengthening. Water current perturbations provides challenge to standing balance requiring increased core activation.  STG's addressed   PATIENT EDUCATION:  Education details: Discussed eval findings, rehab rationale, aquatic program progression/POC and pools in area. Patient is in agreement  Person educated: Patient Education method: Chief Technology Officer Education comprehension: verbalized understanding  HOME EXERCISE PROGRAM: Aquatic to be assigned  ASSESSMENT:  CLINICAL IMPRESSION:  Patient tolerated treatment well. Patient initial heart rate at entry was 101 bpm. After 2x interval we increased her heart rate to 106 bpm. Patient was able to get 243 steps. Patient was able to performed ankle pumps, quad sets in an elevated seated position. Patient was also given a stretch for her hamstring and mid back. Patient was advised of doing ankle pumps seated or lying down. Will continue to add exercise to patient as tolerated.     She demonstrates indep and safety in setting with therapist instructing  from deck.   Pt reports knee discomfort over the past week.  Tried some of the water exercises at home and was unable to complete due to pain. She will be instructed on land based exercises at first land based appt next visit.  Improved immediate standing balance with weight shift STS  completing onto water step without UE support and without LOB. She does need extra ue support with standing exercises today due to left sided slight LBP with lifting rle.  Modified as needed for execution of exercises.  Knee pain decreases throughout session to ~5/10. She reports intent to gain membership soon at pool. Lowest pain continues to be ~5/10. ROM measurements to be (please) measured at land based session (next).      Initial clinical impression. Patient is a 47 y.o. f who was seen today for physical therapy evaluation and treatment for OA bilat knees.  Pt presents by herself transported to therapy pool via wc.  She amb with cane from parking lot into facility. Pt with long med Hx see above.  She is in process of losing weight to become eligible to have bariatric surgery and TKR. She reports high pain in knees (and LB) daily with the sensation that they will buckle under her after walking or standing >3 minutes.  MMT indicated hips weaker than knees.  ROM slightly limited in lle knee.  Difficulty to assess hips and LB due to body habitus.  Gait is antalgic but gait speed and cadence wfl.  She will benefit from skilled PT intervention for strengthening, gait and balance retraining with intent to DC to Indep completion of aquatic HEP at pool she will gain membership for long term management of chronic condition.  She will also benefit from 2 sessions of land based PT for instruction on HEP  OBJECTIVE IMPAIRMENTS: decreased activity tolerance, decreased mobility, difficulty walking, decreased ROM, decreased strength, improper body mechanics, obesity, and pain.   ACTIVITY LIMITATIONS: carrying, lifting, bending, sitting, standing, squatting, sleeping, stairs, and transfers  PARTICIPATION LIMITATIONS: meal prep, cleaning, laundry, shopping, community activity, occupation, and yard work  PERSONAL FACTORS: Fitness, Time since onset of injury/illness/exacerbation, and 3+ comorbidities: see above  chart  are also affecting patient's functional outcome.   REHAB POTENTIAL: Good  CLINICAL DECISION MAKING: Evolving/moderate complexity  EVALUATION COMPLEXITY: Moderate   GOALS: Goals reviewed with patient? Yes  SHORT TERM GOALS: Target date: 11/21/22 Pt will tolerate full aquatic sessions consistently without increase in pain and with improving function to demonstrate good toleration and effectiveness of intervention.   Baseline: Goal status:Met 11/20/22  2.  Pt to gain access to pool in order to begin completing aquatic HEP for increased progression of function Baseline: none Goal status: in progress 11/20/22  3.  Pt will report decrease in minimal pain to 3/10 or< for improved toleration to activity Baseline: 5/10 Goal status: In progress 11/20/22  4.  Pt will improve L knee flex up to or > 100d and extension to 0d for improved gait. Baseline: see chart Goal status: INITIAL    LONG TERM GOALS: Target date: 12/19/22  Pt to meet stated Foto Goal 41% to demonstrate improved perception of physical ability Baseline: 26 % Goal status: INITIAL  2.  Pt will be indep with final HEP's (land and aquatic as appropriate) for continued management of condition  Baseline: none Goal status: INITIAL  3.  Pt will improve on Tug test to <or= 14s to demonstrate improvement in lower extremity function, mobility and decreased fall risk. Baseline: 21.32 Goal status: INITIAL  4.  Pt will report improvement in ability to stand up to or > 10 minutes Baseline: 3 min Goal status: INITIAL  5.  Pt will improve hip and knee flex strength up to 1 full grade to demonstrate improved overall physical function  Baseline: hips 3+; knee flex 4/5 Goal status: INITIAL   PLAN:  PT FREQUENCY: 1-2x/week  PT DURATION: 8 weeks/12 visits allowing for scheduling conflicts  PLANNED INTERVENTIONS: Therapeutic exercises, Therapeutic activity, Neuromuscular re-education, Balance training, Gait training,  Patient/Family education, Self Care, Joint mobilization, Stair training, DME instructions, Aquatic Therapy, Dry Needling, Moist heat, Taping, Ionotophoresis /ml Dexamethasone, Manual therapy, and Re-evaluation  PLAN FOR NEXT SESSION: Consider LAQ, clamshells, and standing marches.    Lorayne Bender PT DPT   11/20/2022, 10:41 AM  Cristal Ford SPT   I have reviewed and concur with this student's documentation.  During this treatment session, the therapist was present, participating in and directing the treatment.

## 2022-11-25 ENCOUNTER — Ambulatory Visit (HOSPITAL_BASED_OUTPATIENT_CLINIC_OR_DEPARTMENT_OTHER): Payer: BC Managed Care – PPO | Admitting: Physical Therapy

## 2022-11-25 ENCOUNTER — Encounter (HOSPITAL_BASED_OUTPATIENT_CLINIC_OR_DEPARTMENT_OTHER): Payer: Self-pay | Admitting: Physical Therapy

## 2022-11-25 DIAGNOSIS — R262 Difficulty in walking, not elsewhere classified: Secondary | ICD-10-CM | POA: Diagnosis not present

## 2022-11-25 DIAGNOSIS — M6281 Muscle weakness (generalized): Secondary | ICD-10-CM | POA: Diagnosis not present

## 2022-11-25 DIAGNOSIS — M25561 Pain in right knee: Secondary | ICD-10-CM | POA: Diagnosis not present

## 2022-11-25 DIAGNOSIS — G8929 Other chronic pain: Secondary | ICD-10-CM | POA: Diagnosis not present

## 2022-11-25 DIAGNOSIS — M25562 Pain in left knee: Secondary | ICD-10-CM | POA: Diagnosis not present

## 2022-11-25 NOTE — Therapy (Signed)
OUTPATIENT PHYSICAL THERAPY LOWER EXTREMITY TREATMENT   Patient Name: Phyllis Calhoun MRN: [id number] DOB:1975-11-07, 47 y.o., female Today's Date: 11/25/2022  END OF SESSION:  PT End of Session - 11/25/22 0931     Visit Number 6    Number of Visits 12    Date for PT Re-Evaluation 12/19/22    Authorization Type BC[employer]    PT Start Time [phone]    PT Stop Time 1015    PT Time Calculation (min) 44 min    Activity Tolerance Patient tolerated treatment well    Behavior During Therapy WFL for tasks assessed/performed             Past Medical History:  Diagnosis Date   Arthritis    both legs   Gallstones    HX OF SEVERAL GALLBLADDER ATTACKS   Headache(784.0)    PT STATES MIGRAINES FROM TRIGEMINAL NEURALGIA   Sleep apnea    sleep study confirmed   Trigeminal neuralgia    2012-PT CONTINUES TO HAVE PAIN RIGHT SIDE OF FACE   Past Surgical History:  Procedure Laterality Date   ABDOMINAL HYSTERECTOMY     BREAST REDUCTION SURGERY     CHOLECYSTECTOMY N/A 05/25/2013   Procedure: LAPAROSCOPIC CHOLECYSTECTOMY WITH INTRAOPERATIVE CHOLANGIOGRAM;  Surgeon: Axel Filler, MD;  Location: [facility];  Service: General;  Laterality: N/A;   KNEE ARTHROSCOPY Left 12/29/2015   Procedure: ARTHROSCOPY KNEE;  Surgeon: Jodi Geralds, MD;  Location: [facility];  Service: Orthopedics;  Laterality: Left;   TONSILLECTOMY     Patient Active Problem List   Diagnosis Date Noted   Hypoventilation associated with obesity syndrome 11/05/2018   Loud snoring 11/05/2018   OSA (obstructive sleep apnea) 11/05/2018   Chest pain 01/20/2016   Trigeminal neuralgia of right side of face 01/20/2016   Shortness of breath 01/20/2016   Acute medial meniscus tear of left knee 12/29/2015   Acute lateral meniscus tear of left knee 12/29/2015   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   Super obesity 12/29/2015    PCP: Camie Patience, FNP  REFERRING PROVIDER: Camie Patience, FNP   REFERRING DIAG: M17.0 (ICD-10-CM) -  Bilateral primary osteoarthritis of knee   THERAPY DIAG:  Muscle weakness (generalized)  Difficulty in walking, not elsewhere classified  Chronic pain of both knees  Rationale for Evaluation and Treatment: Rehabilitation  ONSET DATE: >5 yrs  SUBJECTIVE:   SUBJECTIVE STATEMENT: Patients states that she is still feeling sore. Patient reports that her knees and ankles were swollen over the weekend.    Doing bariatric treatment. Wants TKR working on losing weight. Aug 2022 surgery for trigeminal nueraliga.  Had complications and is still recovering. Using cane.  Has had back surgery 2023 lumbar spine. Knee gel injection x 3 each knees over last couple of months without improvement. Knees become weak with standing then back will start to hurt. Pt reports doing water aerobics back in 2020 lost some weight. Pt reports waiting for appt for ablation in knee.   PERTINENT HISTORY:  PAIN:  Are you having pain? Yes: NPRS scale: current 7/10; worst 7/10 least 5/10 Pain location: knees Pain description: achy when I sleep Aggravating factors: lying, standing, walking x 3 minutes Relieving factors: sitting, resting, heat  PRECAUTIONS: Knee and Fall  WEIGHT BEARING RESTRICTIONS: No  FALLS:  Has patient fallen in last 6 months? No  LIVING ENVIRONMENT: Lives with: lives with their family, lives with their spouse, and mother and father Lives in: House/apartment Stairs: Yes: Internal: 16 steps; on  right going up Has following equipment at home: Single point cane and Walker - 2 wheeled  OCCUPATION: disabled  PLOF: Requires assistive device for independence and Needs assistance with ADLs  PATIENT GOALS: feel better about myself/life, decrease pain  NEXT MD VISIT: next month  OBJECTIVE:   DIAGNOSTIC FINDINGS: none recent  PATIENT SURVEYS:  FOTO Primary measure 26% goal 41%  COGNITION: Overall cognitive status: Within functional limits for tasks  assessed     SENSATION: WFL    MUSCLE LENGTH:  POSTURE: No Significant postural limitations identified. Difficult to assess due to body habitus  PALPATION: No TTP  LOWER EXTREMITY ROM:  Active ROM Right eval Left eval  Hip flexion    Hip extension    Hip abduction    Hip adduction    Hip internal rotation    Hip external rotation    Knee flexion 100 88  Knee extension 0 -5  Ankle dorsiflexion    Ankle plantarflexion    Ankle inversion    Ankle eversion     (Blank rows = not tested)  LOWER EXTREMITY MMT:  MMT Right eval Left eval  Hip flexion 3+ 3+  Hip extension    Hip abduction    Hip adduction    Hip internal rotation    Hip external rotation    Knee flexion 4 4  Knee extension 5 5  Ankle dorsiflexion    Ankle plantarflexion    Ankle inversion    Ankle eversion     (Blank rows = not tested)    FUNCTIONAL TESTS:  Timed up and go (TUG): 21.32   GAIT: Distance walked: 150 ft Assistive device utilized: Single point cane Level of assistance: Modified independence Comments: antalgic, increased lateral displacement due to decreased knee flex bilaterally, decreased time left in stance   TODAY'S TREATMENT:        4/22 Nu step 80 steps x3 mins L1 Shoulder diagonal 3x10 each red band Shoulder horizontal abduction 3x10 red band  Bicep curls 3x10 4lbs Hamstring curls red band 3x10 Hamstring stretch with black strap 3x30sec each Ankle pumps 3x10  Heel raise 3x10 Standing weight shift Standing marches   4/18 Nu step 243 total steps 2x2 mins interval HR initial 101 after intervals 105 Ankle pumps 3x15 Quad sets 3x15 Seated hamstring stretch 3x30sec with black strap Ball stretch x5 forward, left and right with red ball                                                                                                                           Last   Pt seen for aquatic therapy today.  Treatment took place in water 3.5-4.75 ft in depth at the  [facility] pool. Temp of water was 91.  Pt entered/exited the pool via stair using step to pattern with hand rail.   Walking forward and back use support on white barbell gradually submerged 4.3 *Suspended up UE support on wall: cycling; add/abd *Standing UE  support barbell: forward, backward jogging; side skipping  -  DF then PF; hip add/abd; hip flex;  cues for weight shifting and adb bracing for improved balance  *Standing ue support on wall: 1/2 diamond; hip flex/ext  - min-squats weight through heels.  *STS without ue support.  Cues for immediate standing balance x 10 gaining position 10/10x *standing L stretch   Pt requires the buoyancy and hydrostatic pressure of water for support, and to offload joints by unweighting joint load by at least 50 % in navel deep water and by at least 75-80% in chest to neck deep water.  Viscosity of the water is needed for resistance of strengthening. Water current perturbations provides challenge to standing balance requiring increased core activation.  STG's addressed   PATIENT EDUCATION:  Education details: Discussed eval findings, rehab rationale, aquatic program progression/POC and pools in area. Patient is in agreement  Person educated: Patient Education method: Chief Technology Officer Education comprehension: verbalized understanding  HOME EXERCISE PROGRAM: Aquatic to be assigned  ASSESSMENT:  CLINICAL IMPRESSION:  Patient continue to report with pain in bilateral medial knees. Patient was limited on nu step, patient was only able to do 3 mins on level for 80 total steps. Patient HEP was updated and encouraged to keep up with 4-5 exercise consistently. Program is designed for both upper and lower body exercise. Patient has no more land visits schedule, patient was given option to continue water therapy and revisit land therapy in a few weeks.    She demonstrates indep and safety in setting with therapist instructing  from  deck.   Pt reports knee discomfort over the past week.  Tried some of the water exercises at home and was unable to complete due to pain. She will be instructed on land based exercises at first land based appt next visit.  Improved immediate standing balance with weight shift STS completing onto water step without UE support and without LOB. She does need extra ue support with standing exercises today due to left sided slight LBP with lifting rle.  Modified as needed for execution of exercises.  Knee pain decreases throughout session to ~5/10. She reports intent to gain membership soon at pool. Lowest pain continues to be ~5/10. ROM measurements to be (please) measured at land based session (next).      Initial clinical impression. Patient is a 47 y.o. f who was seen today for physical therapy evaluation and treatment for OA bilat knees.  Pt presents by herself transported to therapy pool via wc.  She amb with cane from parking lot into facility. Pt with long med Hx see above.  She is in process of losing weight to become eligible to have bariatric surgery and TKR. She reports high pain in knees (and LB) daily with the sensation that they will buckle under her after walking or standing >3 minutes.  MMT indicated hips weaker than knees.  ROM slightly limited in lle knee.  Difficulty to assess hips and LB due to body habitus.  Gait is antalgic but gait speed and cadence wfl.  She will benefit from skilled PT intervention for strengthening, gait and balance retraining with intent to DC to Indep completion of aquatic HEP at pool she will gain membership for long term management of chronic condition.  She will also benefit from 2 sessions of land based PT for instruction on HEP  OBJECTIVE IMPAIRMENTS: decreased activity tolerance, decreased mobility, difficulty walking, decreased ROM, decreased strength, improper body mechanics, obesity, and pain.  ACTIVITY LIMITATIONS: carrying, lifting, bending, sitting,  standing, squatting, sleeping, stairs, and transfers  PARTICIPATION LIMITATIONS: meal prep, cleaning, laundry, shopping, community activity, occupation, and yard work  PERSONAL FACTORS: Fitness, Time since onset of injury/illness/exacerbation, and 3+ comorbidities: see above chart  are also affecting patient's functional outcome.   REHAB POTENTIAL: Good  CLINICAL DECISION MAKING: Evolving/moderate complexity  EVALUATION COMPLEXITY: Moderate   GOALS: Goals reviewed with patient? Yes  SHORT TERM GOALS: Target date: 11/21/22 Pt will tolerate full aquatic sessions consistently without increase in pain and with improving function to demonstrate good toleration and effectiveness of intervention.   Baseline: Goal status:Met 11/20/22  2.  Pt to gain access to pool in order to begin completing aquatic HEP for increased progression of function Baseline: none Goal status: in progress 11/20/22  3.  Pt will report decrease in minimal pain to 3/10 or< for improved toleration to activity Baseline: 5/10 Goal status: In progress 11/20/22  4.  Pt will improve L knee flex up to or > 100d and extension to 0d for improved gait. Baseline: see chart Goal status: INITIAL    LONG TERM GOALS: Target date: 12/19/22  Pt to meet stated Foto Goal 41% to demonstrate improved perception of physical ability Baseline: 26 % Goal status: INITIAL  2.  Pt will be indep with final HEP's (land and aquatic as appropriate) for continued management of condition  Baseline: none Goal status: INITIAL  3.  Pt will improve on Tug test to <or= 14s to demonstrate improvement in lower extremity function, mobility and decreased fall risk. Baseline: 21.32 Goal status: INITIAL  4.  Pt will report improvement in ability to stand up to or > 10 minutes Baseline: 3 min Goal status: INITIAL  5.  Pt will improve hip and knee flex strength up to 1 full grade to demonstrate improved overall physical function  Baseline: hips 3+;  knee flex 4/5 Goal status: INITIAL   PLAN:  PT FREQUENCY: 1-2x/week  PT DURATION: 8 weeks/12 visits allowing for scheduling conflicts  PLANNED INTERVENTIONS: Therapeutic exercises, Therapeutic activity, Neuromuscular re-education, Balance training, Gait training, Patient/Family education, Self Care, Joint mobilization, Stair training, DME instructions, Aquatic Therapy, Dry Needling, Moist heat, Taping, Ionotophoresis 4mg /ml Dexamethasone, Manual therapy, and Re-evaluation  PLAN FOR NEXT SESSION: Patient has no more land appointments, will schedule    Lorayne Bender PT DPT   11/25/2022, 11:34 AM  Cristal Ford SPT   I have reviewed and concur with this student's documentation.  During this treatment session, the therapist was present, participating in and directing the treatment.

## 2022-11-28 ENCOUNTER — Encounter (HOSPITAL_BASED_OUTPATIENT_CLINIC_OR_DEPARTMENT_OTHER): Payer: Self-pay | Admitting: Physical Therapy

## 2022-11-28 ENCOUNTER — Ambulatory Visit (HOSPITAL_BASED_OUTPATIENT_CLINIC_OR_DEPARTMENT_OTHER): Payer: BC Managed Care – PPO | Admitting: Physical Therapy

## 2022-11-28 DIAGNOSIS — M6281 Muscle weakness (generalized): Secondary | ICD-10-CM

## 2022-11-28 DIAGNOSIS — R262 Difficulty in walking, not elsewhere classified: Secondary | ICD-10-CM

## 2022-11-28 DIAGNOSIS — G8929 Other chronic pain: Secondary | ICD-10-CM | POA: Diagnosis not present

## 2022-11-28 DIAGNOSIS — M25562 Pain in left knee: Secondary | ICD-10-CM | POA: Diagnosis not present

## 2022-11-28 DIAGNOSIS — M25561 Pain in right knee: Secondary | ICD-10-CM | POA: Diagnosis not present

## 2022-11-28 NOTE — Therapy (Addendum)
OUTPATIENT PHYSICAL THERAPY LOWER EXTREMITY TREATMENT   Patient Name: Phyllis Calhoun MRN: [id number] DOB:[birth year], 47 y.o., female Today's Date: 12/02/2022  END OF SESSION:  PT End of Session -     Visit Number 7   Number of Visits 12   Date for PT Re-Evaluation    Authorization Type    PT Start Time 1447    PT Stop Time 1530   PT Time Calculation (min) 44 min   Activity Tolerance Tolerated well   Behavior During Therapy  WFL      Past Medical History:  Diagnosis Date   Arthritis    both legs   Gallstones    HX OF SEVERAL GALLBLADDER ATTACKS   Headache(784.0)    PT STATES MIGRAINES FROM TRIGEMINAL NEURALGIA   Sleep apnea    sleep study confirmed   Trigeminal neuralgia    2012-PT CONTINUES TO HAVE PAIN RIGHT SIDE OF FACE   Past Surgical History:  Procedure Laterality Date   ABDOMINAL HYSTERECTOMY     BREAST REDUCTION SURGERY     CHOLECYSTECTOMY N/A 05/25/2013   Procedure: LAPAROSCOPIC CHOLECYSTECTOMY WITH INTRAOPERATIVE CHOLANGIOGRAM;  Surgeon: Axel Filler, MD;  Location: [facility];  Service: General;  Laterality: N/A;   KNEE ARTHROSCOPY Left 12/29/2015   Procedure: ARTHROSCOPY KNEE;  Surgeon: Jodi Geralds, MD;  Location: [facility];  Service: Orthopedics;  Laterality: Left;   TONSILLECTOMY     Patient Active Problem List   Diagnosis Date Noted   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   Loud snoring 11/05/2018   OSA (obstructive sleep apnea) 11/05/2018   Chest pain 01/20/2016   Trigeminal neuralgia of right side of face 01/20/2016   Shortness of breath 01/20/2016   Acute medial meniscus tear of left knee 12/29/2015   Acute lateral meniscus tear of left knee 12/29/2015   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   Super obesity 12/29/2015    PCP: Camie Patience, FNP  REFERRING PROVIDER: Camie Patience, FNP   REFERRING DIAG: M17.0 (ICD-10-CM) - Bilateral primary osteoarthritis of knee   THERAPY DIAG:  Muscle weakness  (generalized)  Difficulty in walking, not elsewhere classified  Chronic pain of both knees  Rationale for Evaluation and Treatment: Rehabilitation  ONSET DATE: >5 yrs  SUBJECTIVE:   SUBJECTIVE STATEMENT: Pt reports left knee cracked then popped while she getting dressed in the locker room today. It knocked her back and sat her down.   Doing bariatric treatment. Wants TKR working on losing weight. Aug 2022 surgery for trigeminal nueraliga.  Had complications and is still recovering. Using cane.  Has had back surgery 2023 lumbar spine. Knee gel injection x 3 each knees over last couple of months without improvement. Knees become weak with standing then back will start to hurt. Pt reports doing water aerobics back in 2020 lost some weight. Pt reports waiting for appt for ablation in knee.   PERTINENT HISTORY:  PAIN:  Are you having pain? Yes: NPRS scale: current 7/10; worst 7/10 least 5/10 Pain location: knees Pain description: achy when I sleep Aggravating factors: lying, standing, walking x 3 minutes Relieving factors: sitting, resting, heat  PRECAUTIONS: Knee and Fall  WEIGHT BEARING RESTRICTIONS: No  FALLS:  Has patient fallen in last 6 months? No  LIVING ENVIRONMENT: Lives with: lives with their family, lives with their spouse, and mother and father Lives in: House/apartment Stairs: Yes: Internal: 16 steps; on right going up Has following equipment at home: Single point cane and Walker - 2 wheeled  OCCUPATION: disabled  PLOF: Requires assistive device for independence and Needs assistance with ADLs  PATIENT GOALS: feel better about myself/life, decrease pain  NEXT MD VISIT: next month  OBJECTIVE:   DIAGNOSTIC FINDINGS: none recent  PATIENT SURVEYS:  FOTO Primary measure 26% goal 41%  COGNITION: Overall cognitive status: Within functional limits for tasks assessed     SENSATION: WFL    MUSCLE LENGTH:  POSTURE: No Significant postural limitations  identified. Difficult to assess due to body habitus  PALPATION: No TTP  LOWER EXTREMITY ROM:  Active ROM Right eval Left eval  Hip flexion    Hip extension    Hip abduction    Hip adduction    Hip internal rotation    Hip external rotation    Knee flexion 100 88  Knee extension 0 -5  Ankle dorsiflexion    Ankle plantarflexion    Ankle inversion    Ankle eversion     (Blank rows = not tested)  LOWER EXTREMITY MMT:  MMT Right eval Left eval  Hip flexion 3+ 3+  Hip extension    Hip abduction    Hip adduction    Hip internal rotation    Hip external rotation    Knee flexion 4 4  Knee extension 5 5  Ankle dorsiflexion    Ankle plantarflexion    Ankle inversion    Ankle eversion     (Blank rows = not tested)    FUNCTIONAL TESTS:  Timed up and go (TUG): 21.32   GAIT: Distance walked: 150 ft Assistive device utilized: Single point cane Level of assistance: Modified independence Comments: antalgic, increased lateral displacement due to decreased knee flex bilaterally, decreased time left in stance   TODAY'S TREATMENT:       4/25  Pt seen for aquatic therapy today.  Treatment took place in water 3.5-4.75 ft in depth at the [facility] pool. Temp of water was 91.  Pt entered/exited the pool via stair using step to pattern with hand rail.   Walking forward and back use support on white barbell gradually submerged 4.3 *Suspended up UE support on wall: cycling; add/abd *Standing UE support barbell: forward, backward jogging; side skipping  - min-squats weight through heels.  *STS without ue support.  Cues for immediate standing balance x 10 gaining position 10/10x *standing L stretch *Holding wall: straight LE circles CW/CCW x 10 each; hip openers x 5 each;  alternating hamstring curls;    Forward/ backward marching with alternating forward punches while holding yellow hand floats.    Pt requires the buoyancy and hydrostatic pressure of water for  support, and to offload joints by unweighting joint load by at least 50 % in navel deep water and by at least 75-80% in chest to neck deep water.  Viscosity of the water is needed for resistance of strengthening. Water current perturbations provides challenge to standing balance requiring                    PATIENT EDUCATION:  Education details: Discussed eval findings, rehab rationale, aquatic program progression/POC and pools in area. Patient is in agreement  Person educated: Patient Education method: Chief Technology Officer Education comprehension: verbalized understanding  HOME EXERCISE PROGRAM: Aquatic to be assigned  ASSESSMENT:  CLINICAL IMPRESSION: Reports poor toleration to land based intervention. Pt edu on OA and its management going forward. She VU.  She has increased left knee pain from incident in locker room.  She is directed though gentle movement  patterns and decompression for pain relief as tolerated. VC for positioning and posture with exercises.  She will continue to benefit from aquatic intervention to progress towards meeting all goals.    She demonstrates indep and safety in setting with therapist instructing  from deck.   Pt reports knee discomfort over the past week.  Tried some of the water exercises at home and was unable to complete due to pain. She will be instructed on land based exercises at first land based appt next visit.  Improved immediate standing balance with weight shift STS completing onto water step without UE support and without LOB. She does need extra ue support with standing exercises today due to left sided slight LBP with lifting rle.  Modified as needed for execution of exercises.  Knee pain decreases throughout session to ~5/10. She reports intent to gain membership soon at pool. Lowest pain continues to be ~5/10. ROM measurements to be (please) measured at land based session (next).      Initial clinical impression. Patient is a 47 y.o.  f who was seen today for physical therapy evaluation and treatment for OA bilat knees.  Pt presents by herself transported to therapy pool via wc.  She amb with cane from parking lot into facility. Pt with long med Hx see above.  She is in process of losing weight to become eligible to have bariatric surgery and TKR. She reports high pain in knees (and LB) daily with the sensation that they will buckle under her after walking or standing >3 minutes.  MMT indicated hips weaker than knees.  ROM slightly limited in lle knee.  Difficulty to assess hips and LB due to body habitus.  Gait is antalgic but gait speed and cadence wfl.  She will benefit from skilled PT intervention for strengthening, gait and balance retraining with intent to DC to Indep completion of aquatic HEP at pool she will gain membership for long term management of chronic condition.  She will also benefit from 2 sessions of land based PT for instruction on HEP  OBJECTIVE IMPAIRMENTS: decreased activity tolerance, decreased mobility, difficulty walking, decreased ROM, decreased strength, improper body mechanics, obesity, and pain.   ACTIVITY LIMITATIONS: carrying, lifting, bending, sitting, standing, squatting, sleeping, stairs, and transfers  PARTICIPATION LIMITATIONS: meal prep, cleaning, laundry, shopping, community activity, occupation, and yard work  PERSONAL FACTORS: Fitness, Time since onset of injury/illness/exacerbation, and 3+ comorbidities: see above chart  are also affecting patient's functional outcome.   REHAB POTENTIAL: Good  CLINICAL DECISION MAKING: Evolving/moderate complexity  EVALUATION COMPLEXITY: Moderate   GOALS: Goals reviewed with patient? Yes  SHORT TERM GOALS: Target date: 11/21/22 Pt will tolerate full aquatic sessions consistently without increase in pain and with improving function to demonstrate good toleration and effectiveness of intervention.   Baseline: Goal status:Met 11/20/22  2.  Pt to gain  access to pool in order to begin completing aquatic HEP for increased progression of function Baseline: none Goal status: in progress 11/20/22  3.  Pt will report decrease in minimal pain to 3/10 or< for improved toleration to activity Baseline: 5/10 Goal status: In progress 11/20/22  4.  Pt will improve L knee flex up to or > 100d and extension to 0d for improved gait. Baseline: see chart Goal status: INITIAL    LONG TERM GOALS: Target date: 12/19/22  Pt to meet stated Foto Goal 41% to demonstrate improved perception of physical ability Baseline: 26 % Goal status: INITIAL  2.  Pt will be  indep with final HEP's (land and aquatic as appropriate) for continued management of condition  Baseline: none Goal status: INITIAL  3.  Pt will improve on Tug test to <or= 14s to demonstrate improvement in lower extremity function, mobility and decreased fall risk. Baseline: 21.32 Goal status: INITIAL  4.  Pt will report improvement in ability to stand up to or > 10 minutes Baseline: 3 min Goal status: INITIAL  5.  Pt will improve hip and knee flex strength up to 1 full grade to demonstrate improved overall physical function  Baseline: hips 3+; knee flex 4/5 Goal status: INITIAL   PLAN:  PT FREQUENCY: 1-2x/week  PT DURATION: 8 weeks/12 visits allowing for scheduling conflicts  PLANNED INTERVENTIONS: Therapeutic exercises, Therapeutic activity, Neuromuscular re-education, Balance training, Gait training, Patient/Family education, Self Care, Joint mobilization, Stair training, DME instructions, Aquatic Therapy, Dry Needling, Moist heat, Taping, Ionotophoresis 4mg /ml Dexamethasone, Manual therapy, and Re-evaluation  PLAN FOR NEXT SESSION: Patient has no more land appointments, will schedule    Corrie Dandy Tomma Lightning) Glena Pharris MPT 12/02/2022, 1:16 PM  Addend Corrie Dandy Tomma Lightning) Nayelli Inglis MPT [id number]

## 2022-12-02 ENCOUNTER — Ambulatory Visit (HOSPITAL_BASED_OUTPATIENT_CLINIC_OR_DEPARTMENT_OTHER): Payer: BC Managed Care – PPO | Admitting: Physical Therapy

## 2022-12-02 ENCOUNTER — Encounter (HOSPITAL_BASED_OUTPATIENT_CLINIC_OR_DEPARTMENT_OTHER): Payer: Self-pay | Admitting: Physical Therapy

## 2022-12-02 DIAGNOSIS — M25562 Pain in left knee: Secondary | ICD-10-CM | POA: Diagnosis not present

## 2022-12-02 DIAGNOSIS — G8929 Other chronic pain: Secondary | ICD-10-CM

## 2022-12-02 DIAGNOSIS — R262 Difficulty in walking, not elsewhere classified: Secondary | ICD-10-CM | POA: Diagnosis not present

## 2022-12-02 DIAGNOSIS — M25561 Pain in right knee: Secondary | ICD-10-CM | POA: Diagnosis not present

## 2022-12-02 DIAGNOSIS — M6281 Muscle weakness (generalized): Secondary | ICD-10-CM

## 2022-12-02 NOTE — Therapy (Signed)
OUTPATIENT PHYSICAL THERAPY LOWER EXTREMITY TREATMENT   Patient Name: Phyllis Calhoun MRN: [id number] DOB:08/07/1975, 47 y.o., female Today's Date: 12/02/2022  END OF SESSION:  PT End of Session - 12/02/22 0934     Visit Number 8    Number of Visits 12    Date for PT Re-Evaluation 12/19/22    Authorization Type BC[employer]    PT Start Time [phone]    PT Stop Time 1030    PT Time Calculation (min) 44 min    Activity Tolerance Patient tolerated treatment well    Behavior During Therapy WFL for tasks assessed/performed             Past Medical History:  Diagnosis Date   Arthritis    both legs   Gallstones    HX OF SEVERAL GALLBLADDER ATTACKS   Headache(784.0)    PT STATES MIGRAINES FROM TRIGEMINAL NEURALGIA   Sleep apnea    sleep study confirmed   Trigeminal neuralgia    2012-PT CONTINUES TO HAVE PAIN RIGHT SIDE OF FACE   Past Surgical History:  Procedure Laterality Date   ABDOMINAL HYSTERECTOMY     BREAST REDUCTION SURGERY     CHOLECYSTECTOMY N/A 05/25/2013   Procedure: LAPAROSCOPIC CHOLECYSTECTOMY WITH INTRAOPERATIVE CHOLANGIOGRAM;  Surgeon: Axel Filler, MD;  Location: [facility];  Service: General;  Laterality: N/A;   KNEE ARTHROSCOPY Left 12/29/2015   Procedure: ARTHROSCOPY KNEE;  Surgeon: Jodi Geralds, MD;  Location: [facility];  Service: Orthopedics;  Laterality: Left;   TONSILLECTOMY     Patient Active Problem List   Diagnosis Date Noted   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   Loud snoring 11/05/2018   OSA (obstructive sleep apnea) 11/05/2018   Chest pain 01/20/2016   Trigeminal neuralgia of right side of face 01/20/2016   Shortness of breath 01/20/2016   Acute medial meniscus tear of left knee 12/29/2015   Acute lateral meniscus tear of left knee 12/29/2015   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   Super obesity 12/29/2015    PCP: Camie Patience, FNP  REFERRING PROVIDER: Camie Patience, FNP   REFERRING DIAG: M17.0  (ICD-10-CM) - Bilateral primary osteoarthritis of knee   THERAPY DIAG:  Muscle weakness (generalized)  Difficulty in walking, not elsewhere classified  Chronic pain of both knees  Rationale for Evaluation and Treatment: Rehabilitation  ONSET DATE: >5 yrs  SUBJECTIVE:   SUBJECTIVE STATEMENT: I walked down here today for the first time   Doing bariatric treatment. Wants TKR working on losing weight. Aug 2022 surgery for trigeminal nueraliga.  Had complications and is still recovering. Using cane.  Has had back surgery 2023 lumbar spine. Knee gel injection x 3 each knees over last couple of months without improvement. Knees become weak with standing then back will start to hurt. Pt reports doing water aerobics back in 2020 lost some weight. Pt reports waiting for appt for ablation in knee.   PERTINENT HISTORY:  PAIN:  Are you having pain? Yes: NPRS scale: current 7/10; worst 7/10 least 5/10 Pain location: knees Pain description: achy when I sleep Aggravating factors: lying, standing, walking x 3 minutes Relieving factors: sitting, resting, heat  PRECAUTIONS: Knee and Fall  WEIGHT BEARING RESTRICTIONS: No  FALLS:  Has patient fallen in last 6 months? No  LIVING ENVIRONMENT: Lives with: lives with their family, lives with their spouse, and mother and father Lives in: House/apartment Stairs: Yes: Internal: 16 steps; on right going up Has following equipment at home: Single point cane  and Walker - 2 wheeled  OCCUPATION: disabled  PLOF: Requires assistive device for independence and Needs assistance with ADLs  PATIENT GOALS: feel better about myself/life, decrease pain  NEXT MD VISIT: next month  OBJECTIVE:   DIAGNOSTIC FINDINGS: none recent  PATIENT SURVEYS:  FOTO Primary measure 26% goal 41%  COGNITION: Overall cognitive status: Within functional limits for tasks assessed     SENSATION: WFL    MUSCLE LENGTH:  POSTURE: No Significant postural limitations  identified. Difficult to assess due to body habitus  PALPATION: No TTP  LOWER EXTREMITY ROM:  Active ROM Right eval Left eval  Hip flexion    Hip extension    Hip abduction    Hip adduction    Hip internal rotation    Hip external rotation    Knee flexion 100 88  Knee extension 0 -5  Ankle dorsiflexion    Ankle plantarflexion    Ankle inversion    Ankle eversion     (Blank rows = not tested)  LOWER EXTREMITY MMT:  MMT Right eval Left eval  Hip flexion 3+ 3+  Hip extension    Hip abduction    Hip adduction    Hip internal rotation    Hip external rotation    Knee flexion 4 4  Knee extension 5 5  Ankle dorsiflexion    Ankle plantarflexion    Ankle inversion    Ankle eversion     (Blank rows = not tested)    FUNCTIONAL TESTS:  Timed up and go (TUG): 21.32   GAIT: Distance walked: 150 ft Assistive device utilized: Single point cane Level of assistance: Modified independence Comments: antalgic, increased lateral displacement due to decreased knee flex bilaterally, decreased time left in stance   TODAY'S TREATMENT:       4/29  Pt seen for aquatic therapy today.  Treatment took place in water 3.5-4.75 ft in depth at the [facility] pool. Temp of water was 91.  Pt entered/exited the pool via stair using step to pattern with hand rail.   Walking forward and back without UE support then with yellow noodle *Suspended up UE support on wall: cycling; add/abd; flutter kicking *Standing UE support yellow HB:   -  DF then PF; hip add/abd; hip flex/ext;  cues for weight shifting and adb bracing for balance UE with green hand bells forward and backward march with ue punches *Standing ue support on wall: hip circles  - mini-squats weight through heels.  *seated cycling; add/abd; flutter  Pt requires the buoyancy and hydrostatic pressure of water for support, and to offload joints by unweighting joint load by at least 50 % in navel deep water and by at  least 75-80% in chest to neck deep water.  Viscosity of the water is needed for resistance of strengthening. Water current perturbations provides challenge to standing balance requiring increased core activation.                  PATIENT EDUCATION:  Education details: Discussed eval findings, rehab rationale, aquatic program progression/POC and pools in area. Patient is in agreement  Person educated: Patient Education method: Chief Technology [employer] Education comprehension: verbalized understanding  HOME EXERCISE PROGRAM: Aquatic to be assigned  ASSESSMENT:  CLINICAL IMPRESSION: Pt reports seemingly increased edema last week after sessions.  Pt instructed to eliminate Jacuzzi (unbilled) after sessions this week to determine if that may be causing.  Pt edu on hydrostatic pressure with improvement on venous return and cardiac function with  submersion. She reports compliance with donning pants in a seated position for safety. She does report knee irritation throughout session today. She tolerates movement fair.  May be due to walking 500 ft from parking lot to setting. Goals ongoing     Initial clinical impression. Patient is a 47 y.o. f who was seen today for physical therapy evaluation and treatment for OA bilat knees.  Pt presents by herself transported to therapy pool via wc.  She amb with cane from parking lot into facility. Pt with long med Hx see above.  She is in process of losing weight to become eligible to have bariatric surgery and TKR. She reports high pain in knees (and LB) daily with the sensation that they will buckle under her after walking or standing >3 minutes.  MMT indicated hips weaker than knees.  ROM slightly limited in lle knee.  Difficulty to assess hips and LB due to body habitus.  Gait is antalgic but gait speed and cadence wfl.  She will benefit from skilled PT intervention for strengthening, gait and balance retraining with intent to DC to Indep completion of  aquatic HEP at pool she will gain membership for long term management of chronic condition.  She will also benefit from 2 sessions of land based PT for instruction on HEP  OBJECTIVE IMPAIRMENTS: decreased activity tolerance, decreased mobility, difficulty walking, decreased ROM, decreased strength, improper body mechanics, obesity, and pain.   ACTIVITY LIMITATIONS: carrying, lifting, bending, sitting, standing, squatting, sleeping, stairs, and transfers  PARTICIPATION LIMITATIONS: meal prep, cleaning, laundry, shopping, community activity, occupation, and yard work  PERSONAL FACTORS: Fitness, Time since onset of injury/illness/exacerbation, and 3+ comorbidities: see above chart  are also affecting patient's functional outcome.   REHAB POTENTIAL: Good  CLINICAL DECISION MAKING: Evolving/moderate complexity  EVALUATION COMPLEXITY: Moderate   GOALS: Goals reviewed with patient? Yes  SHORT TERM GOALS: Target date: 11/21/22 Pt will tolerate full aquatic sessions consistently without increase in pain and with improving function to demonstrate good toleration and effectiveness of intervention.   Baseline: Goal status:Met 11/20/22  2.  Pt to gain access to pool in order to begin completing aquatic HEP for increased progression of function Baseline: none Goal status: in progress 11/20/22  3.  Pt will report decrease in minimal pain to 3/10 or< for improved toleration to activity Baseline: 5/10 Goal status: In progress 11/20/22  4.  Pt will improve L knee flex up to or > 100d and extension to 0d for improved gait. Baseline: see chart Goal status: INITIAL    LONG TERM GOALS: Target date: 12/19/22  Pt to meet stated Foto Goal 41% to demonstrate improved perception of physical ability Baseline: 26 % Goal status: INITIAL  2.  Pt will be indep with final HEP's (land and aquatic as appropriate) for continued management of condition  Baseline: none Goal status: INITIAL  3.  Pt will improve  on Tug test to <or= 14s to demonstrate improvement in lower extremity function, mobility and decreased fall risk. Baseline: 21.32 Goal status: INITIAL  4.  Pt will report improvement in ability to stand up to or > 10 minutes Baseline: 3 min Goal status: INITIAL  5.  Pt will improve hip and knee flex strength up to 1 full grade to demonstrate improved overall physical function  Baseline: hips 3+; knee flex 4/5 Goal status: INITIAL   PLAN:  PT FREQUENCY: 1-2x/week  PT DURATION: 8 weeks/12 visits allowing for scheduling conflicts  PLANNED INTERVENTIONS: Therapeutic exercises, Therapeutic activity, Neuromuscular  re-education, Balance training, Gait training, Patient/Family education, Self Care, Joint mobilization, Stair training, DME instructions, Aquatic Therapy, Dry Needling, Moist heat, Taping, Ionotophoresis 4mg /ml Dexamethasone, Manual therapy, and Re-evaluation  PLAN FOR NEXT SESSION: Patient has no more land appointments, will schedule    Corrie Dandy Tomma Lightning) Yani Lal MPT 12/02/22 1032a

## 2022-12-06 ENCOUNTER — Encounter (HOSPITAL_BASED_OUTPATIENT_CLINIC_OR_DEPARTMENT_OTHER): Payer: Self-pay | Admitting: Physical Therapy

## 2022-12-06 ENCOUNTER — Ambulatory Visit (HOSPITAL_BASED_OUTPATIENT_CLINIC_OR_DEPARTMENT_OTHER): Payer: BC Managed Care – PPO | Attending: Family Medicine | Admitting: Physical Therapy

## 2022-12-06 DIAGNOSIS — M25562 Pain in left knee: Secondary | ICD-10-CM | POA: Insufficient documentation

## 2022-12-06 DIAGNOSIS — R262 Difficulty in walking, not elsewhere classified: Secondary | ICD-10-CM | POA: Diagnosis not present

## 2022-12-06 DIAGNOSIS — G8929 Other chronic pain: Secondary | ICD-10-CM | POA: Diagnosis not present

## 2022-12-06 DIAGNOSIS — M6281 Muscle weakness (generalized): Secondary | ICD-10-CM | POA: Diagnosis not present

## 2022-12-06 DIAGNOSIS — M25561 Pain in right knee: Secondary | ICD-10-CM | POA: Insufficient documentation

## 2022-12-06 NOTE — Therapy (Signed)
OUTPATIENT PHYSICAL THERAPY LOWER EXTREMITY TREATMENT   Patient Name: Phyllis Calhoun MRN: [id number] DOB:1976-01-10, 47 y.o., female Today's Date: 12/06/2022  END OF SESSION:  PT End of Session - 12/06/22 1305     Visit Number 9    Number of Visits 12    Date for PT Re-Evaluation 12/19/22    Authorization Type BCBS    PT Start Time 1030    PT Stop Time 1115    PT Time Calculation (min) 45 min    Activity Tolerance Patient tolerated treatment well    Behavior During Therapy WFL for tasks assessed/performed              Past Medical History:  Diagnosis Date   Arthritis    both legs   Gallstones    HX OF SEVERAL GALLBLADDER ATTACKS   Headache(784.0)    PT STATES MIGRAINES FROM TRIGEMINAL NEURALGIA   Sleep apnea    sleep study confirmed   Trigeminal neuralgia    2012-PT CONTINUES TO HAVE PAIN RIGHT SIDE OF FACE   Past Surgical History:  Procedure Laterality Date   ABDOMINAL HYSTERECTOMY     BREAST REDUCTION SURGERY     CHOLECYSTECTOMY N/A 05/25/2013   Procedure: LAPAROSCOPIC CHOLECYSTECTOMY WITH INTRAOPERATIVE CHOLANGIOGRAM;  Surgeon: Axel Filler, MD;  Location: [facility];  Service: General;  Laterality: N/A;   KNEE ARTHROSCOPY Left 12/29/2015   Procedure: ARTHROSCOPY KNEE;  Surgeon: Jodi Geralds, MD;  Location: [facility];  Service: Orthopedics;  Laterality: Left;   TONSILLECTOMY     Patient Active Problem List   Diagnosis Date Noted   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   Loud snoring 11/05/2018   OSA (obstructive sleep apnea) 11/05/2018   Chest pain 01/20/2016   Trigeminal neuralgia of right side of face 01/20/2016   Shortness of breath 01/20/2016   Acute medial meniscus tear of left knee 12/29/2015   Acute lateral meniscus tear of left knee 12/29/2015   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   Super obesity 12/29/2015    PCP: Camie Patience, FNP  REFERRING PROVIDER: Camie Patience, FNP   REFERRING DIAG: M17.0  (ICD-10-CM) - Bilateral primary osteoarthritis of knee   THERAPY DIAG:  Muscle weakness (generalized)  Difficulty in walking, not elsewhere classified  Chronic pain of both knees  Rationale for Evaluation and Treatment: Rehabilitation  ONSET DATE: >5 yrs  SUBJECTIVE:   SUBJECTIVE STATEMENT: I did not have as much swelling after last session but was very sore afterward.   Doing bariatric treatment. Wants TKR working on losing weight. Aug 2022 surgery for trigeminal nueraliga.  Had complications and is still recovering. Using cane.  Has had back surgery 2023 lumbar spine. Knee gel injection x 3 each knees over last couple of months without improvement. Knees become weak with standing then back will start to hurt. Pt reports doing water aerobics back in 2020 lost some weight. Pt reports waiting for appt for ablation in knee.   PERTINENT HISTORY:  PAIN:  Are you having pain? Yes: NPRS scale: current 7/10; worst 7/10 least 5/10 Pain location: knees Pain description: achy when I sleep Aggravating factors: lying, standing, walking x 3 minutes Relieving factors: sitting, resting, heat  PRECAUTIONS: Knee and Fall  WEIGHT BEARING RESTRICTIONS: No  FALLS:  Has patient fallen in last 6 months? No  LIVING ENVIRONMENT: Lives with: lives with their family, lives with their spouse, and mother and father Lives in: House/apartment Stairs: Yes: Internal: 16 steps; on right going up Has  following equipment at home: Single point cane and Walker - 2 wheeled  OCCUPATION: disabled  PLOF: Requires assistive device for independence and Needs assistance with ADLs  PATIENT GOALS: feel better about myself/life, decrease pain  NEXT MD VISIT: next month  OBJECTIVE:   DIAGNOSTIC FINDINGS: none recent  PATIENT SURVEYS:  FOTO Primary measure 26% goal 41%  COGNITION: Overall cognitive status: Within functional limits for tasks assessed     SENSATION: WFL    MUSCLE LENGTH:  POSTURE:  No Significant postural limitations identified. Difficult to assess due to body habitus  PALPATION: No TTP  LOWER EXTREMITY ROM:  Active ROM Right eval Left eval  Hip flexion    Hip extension    Hip abduction    Hip adduction    Hip internal rotation    Hip external rotation    Knee flexion 100 88  Knee extension 0 -5  Ankle dorsiflexion    Ankle plantarflexion    Ankle inversion    Ankle eversion     (Blank rows = not tested)  LOWER EXTREMITY MMT:  MMT Right eval Left eval  Hip flexion 3+ 3+  Hip extension    Hip abduction    Hip adduction    Hip internal rotation    Hip external rotation    Knee flexion 4 4  Knee extension 5 5  Ankle dorsiflexion    Ankle plantarflexion    Ankle inversion    Ankle eversion     (Blank rows = not tested)    FUNCTIONAL TESTS:  Timed up and go (TUG): 21.32   GAIT: Distance walked: 150 ft Assistive device utilized: Single point cane Level of assistance: Modified independence Comments: antalgic, increased lateral displacement due to decreased knee flex bilaterally, decreased time left in stance   TODAY'S TREATMENT:       5/3  Pt seen for aquatic therapy today.  Treatment took place in water 3.5-4.75 ft in depth at the [facility] pool. Temp of water was 91.  Pt entered/exited the pool via stair using step to pattern with hand rail.   Walking forward and back without UE support then with yellow noodle *Standing without UE support:   -  DF then PF; hip add/abd; hip flex/ext; hip circles cw&ccw *hip hinge reaching with barbell. Cues for purposeful movement with concentration on target muscle groups  - resting in squatted position *grapevine (unable to complete) *SLS rle in 4.0 ft holding x 20 s after several tries  -lle in 4.3 ft with ue support yellow HB x 20 s after several tries. *TrA sets pushing kb down.   Pt requires the buoyancy and hydrostatic pressure of water for support, and to offload joints by  unweighting joint load by at least 50 % in navel deep water and by at least 75-80% in chest to neck deep water.  Viscosity of the water is needed for resistance of strengthening. Water current perturbations provides challenge to standing balance requiring increased core activation.                  PATIENT EDUCATION:  Education details: Discussed eval findings, rehab rationale, aquatic program progression/POC and pools in area. Patient is in agreement  Person educated: Patient Education method: Chief Technology Officer Education comprehension: verbalized understanding  HOME EXERCISE PROGRAM: Aquatic to be assigned  ASSESSMENT:  CLINICAL IMPRESSION: Increased pain after last session due to amb to setting.  Seems to have been too much strain including completing session.  We will  continue to work on walking toleration. Decreased edema also last session as pt did not use Jacuzzi and will not return to its use.  She demonstrates improving indep with exercises submerged initiating walking and beginning exercises as instructed previous sessions. She has good toleration to increased speed with movement engaging le without increase in knee pain in pool. Began DC planning. She continues to benefit from skilled PT intervention to improve strength and ROM of knees, improve toleration to activity looking forward to weight loss and TKR.      Initial clinical impression. Patient is a 47 y.o. f who was seen today for physical therapy evaluation and treatment for OA bilat knees.  Pt presents by herself transported to therapy pool via wc.  She amb with cane from parking lot into facility. Pt with long med Hx see above.  She is in process of losing weight to become eligible to have bariatric surgery and TKR. She reports high pain in knees (and LB) daily with the sensation that they will buckle under her after walking or standing >3 minutes.  MMT indicated hips weaker than knees.  ROM slightly limited in lle  knee.  Difficulty to assess hips and LB due to body habitus.  Gait is antalgic but gait speed and cadence wfl.  She will benefit from skilled PT intervention for strengthening, gait and balance retraining with intent to DC to Indep completion of aquatic HEP at pool she will gain membership for long term management of chronic condition.  She will also benefit from 2 sessions of land based PT for instruction on HEP  OBJECTIVE IMPAIRMENTS: decreased activity tolerance, decreased mobility, difficulty walking, decreased ROM, decreased strength, improper body mechanics, obesity, and pain.   ACTIVITY LIMITATIONS: carrying, lifting, bending, sitting, standing, squatting, sleeping, stairs, and transfers  PARTICIPATION LIMITATIONS: meal prep, cleaning, laundry, shopping, community activity, occupation, and yard work  PERSONAL FACTORS: Fitness, Time since onset of injury/illness/exacerbation, and 3+ comorbidities: see above chart  are also affecting patient's functional outcome.   REHAB POTENTIAL: Good  CLINICAL DECISION MAKING: Evolving/moderate complexity  EVALUATION COMPLEXITY: Moderate   GOALS: Goals reviewed with patient? Yes  SHORT TERM GOALS: Target date: 11/21/22 Pt will tolerate full aquatic sessions consistently without increase in pain and with improving function to demonstrate good toleration and effectiveness of intervention.   Baseline: Goal status:Met 11/20/22  2.  Pt to gain access to pool in order to begin completing aquatic HEP for increased progression of function Baseline: none Goal status: in progress 11/20/22  3.  Pt will report decrease in minimal pain to 3/10 or< for improved toleration to activity Baseline: 5/10 Goal status: In progress 11/20/22  4.  Pt will improve L knee flex up to or > 100d and extension to 0d for improved gait. Baseline: see chart Goal status: INITIAL    LONG TERM GOALS: Target date: 12/19/22  Pt to meet stated Foto Goal 41% to demonstrate  improved perception of physical ability Baseline: 26 % Goal status: INITIAL  2.  Pt will be indep with final HEP's (land and aquatic as appropriate) for continued management of condition  Baseline: none Goal status: INITIAL  3.  Pt will improve on Tug test to <or= 14s to demonstrate improvement in lower extremity function, mobility and decreased fall risk. Baseline: 21.32 Goal status: INITIAL  4.  Pt will report improvement in ability to stand up to or > 10 minutes Baseline: 3 min Goal status: INITIAL  5.  Pt will improve hip and  knee flex strength up to 1 full grade to demonstrate improved overall physical function  Baseline: hips 3+; knee flex 4/5 Goal status: INITIAL   PLAN:  PT FREQUENCY: 1-2x/week  PT DURATION: 8 weeks/12 visits allowing for scheduling conflicts  PLANNED INTERVENTIONS: Therapeutic exercises, Therapeutic activity, Neuromuscular re-education, Balance training, Gait training, Patient/Family education, Self Care, Joint mobilization, Stair training, DME instructions, Aquatic Therapy, Dry Needling, Moist heat, Taping, Ionotophoresis 4mg /ml Dexamethasone, Manual therapy, and Re-evaluation  PLAN FOR NEXT SESSION: Patient has no more land appointments, will schedule    Corrie Dandy Tomma Lightning) Gray Maugeri MPT 12/06/22  112pm

## 2022-12-09 ENCOUNTER — Ambulatory Visit (HOSPITAL_BASED_OUTPATIENT_CLINIC_OR_DEPARTMENT_OTHER): Payer: Medicare Other | Admitting: Physical Therapy

## 2022-12-11 ENCOUNTER — Other Ambulatory Visit: Payer: Self-pay

## 2022-12-12 ENCOUNTER — Other Ambulatory Visit: Payer: Self-pay

## 2022-12-12 MED ORDER — MELOXICAM 15 MG PO TABS
15.0000 mg | ORAL_TABLET | Freq: Every day | ORAL | 2 refills | Status: DC
  Filled 2022-12-12: qty 30, 30d supply, fill #0
  Filled 2023-01-09: qty 30, 30d supply, fill #1
  Filled 2023-02-04: qty 30, 30d supply, fill #2

## 2022-12-13 ENCOUNTER — Other Ambulatory Visit (HOSPITAL_COMMUNITY): Payer: Self-pay

## 2022-12-13 ENCOUNTER — Encounter (HOSPITAL_BASED_OUTPATIENT_CLINIC_OR_DEPARTMENT_OTHER): Payer: Self-pay | Admitting: Physical Therapy

## 2022-12-13 ENCOUNTER — Other Ambulatory Visit: Payer: Self-pay

## 2022-12-13 ENCOUNTER — Ambulatory Visit (HOSPITAL_BASED_OUTPATIENT_CLINIC_OR_DEPARTMENT_OTHER): Payer: BC Managed Care – PPO | Admitting: Physical Therapy

## 2022-12-13 DIAGNOSIS — M6281 Muscle weakness (generalized): Secondary | ICD-10-CM

## 2022-12-13 DIAGNOSIS — R262 Difficulty in walking, not elsewhere classified: Secondary | ICD-10-CM | POA: Diagnosis not present

## 2022-12-13 DIAGNOSIS — G8929 Other chronic pain: Secondary | ICD-10-CM | POA: Diagnosis not present

## 2022-12-13 DIAGNOSIS — M25561 Pain in right knee: Secondary | ICD-10-CM | POA: Diagnosis not present

## 2022-12-13 DIAGNOSIS — M25562 Pain in left knee: Secondary | ICD-10-CM | POA: Diagnosis not present

## 2022-12-13 NOTE — Therapy (Addendum)
OUTPATIENT PHYSICAL THERAPY LOWER EXTREMITY TREATMENT  Progress Note Reporting Period 10/24/22 to 12/13/22  See note below for Objective Data and Assessment of Progress/Goals.     Patient Name: Phyllis Calhoun MRN: [id number] DOB:1975/12/25, 47 y.o., female Today's Date: 12/06/2022  END OF SESSION:  PT End of Session - 12/06/22 1305     Visit Number 9    Number of Visits 12    Date for PT Re-Evaluation 12/19/22    Authorization Type BC[employer]    PT Start Time 1030    PT Stop Time 1115    PT Time Calculation (min) 45 min    Activity Tolerance Patient tolerated treatment well    Behavior During Therapy WFL for tasks assessed/performed              Past Medical History:  Diagnosis Date   Arthritis    both legs   Gallstones    HX OF SEVERAL GALLBLADDER ATTACKS   Headache(784.0)    PT STATES MIGRAINES FROM TRIGEMINAL NEURALGIA   Sleep apnea    sleep study confirmed   Trigeminal neuralgia    2012-PT CONTINUES TO HAVE PAIN RIGHT SIDE OF FACE   Past Surgical History:  Procedure Laterality Date   ABDOMINAL HYSTERECTOMY     BREAST REDUCTION SURGERY     CHOLECYSTECTOMY N/A 05/25/2013   Procedure: LAPAROSCOPIC CHOLECYSTECTOMY WITH INTRAOPERATIVE CHOLANGIOGRAM;  Surgeon: Axel Filler, MD;  Location: [facility];  Service: General;  Laterality: N/A;   KNEE ARTHROSCOPY Left 12/29/2015   Procedure: ARTHROSCOPY KNEE;  Surgeon: Jodi Geralds, MD;  Location: [facility];  Service: Orthopedics;  Laterality: Left;   TONSILLECTOMY     Patient Active Problem List   Diagnosis Date Noted   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   Loud snoring 11/05/2018   OSA (obstructive sleep apnea) 11/05/2018   Chest pain 01/20/2016   Trigeminal neuralgia of right side of face 01/20/2016   Shortness of breath 01/20/2016   Acute medial meniscus tear of left knee 12/29/2015   Acute lateral meniscus tear of left knee 12/29/2015   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   Super  obesity 12/29/2015    PCP: Camie Patience, FNP  REFERRING PROVIDER: Camie Patience, FNP   REFERRING DIAG: M17.0 (ICD-10-CM) - Bilateral primary osteoarthritis of knee   THERAPY DIAG:  Muscle weakness (generalized)  Difficulty in walking, not elsewhere classified  Chronic pain of both knees  Rationale for Evaluation and Treatment: Rehabilitation  ONSET DATE: >5 yrs  SUBJECTIVE:   SUBJECTIVE STATEMENT: Ran out of pain meds so my pain has been up a little.   Doing bariatric treatment. Wants TKR working on losing weight. Aug 2022 surgery for trigeminal nueraliga.  Had complications and is still recovering. Using cane.  Has had back surgery 2023 lumbar spine. Knee gel injection x 3 each knees over last couple of months without improvement. Knees become weak with standing then back will start to hurt. Pt reports doing water aerobics back in 2020 lost some weight. Pt reports waiting for appt for ablation in knee.   PERTINENT HISTORY:  PAIN:  Are you having pain? Yes: NPRS scale: current 6/10; worst 7/10 least 5/10 Pain location: knees Pain description: achy when I sleep Aggravating factors: lying, standing, walking x 3 minutes Relieving factors: sitting, resting, heat  PRECAUTIONS: Knee and Fall  WEIGHT BEARING RESTRICTIONS: No  FALLS:  Has patient fallen in last 6 months? No  LIVING ENVIRONMENT: Lives with: lives with their family, lives with  their spouse, and mother and father Lives in: House/apartment Stairs: Yes: Internal: 16 steps; on right going up Has following equipment at home: Single point cane and Walker - 2 wheeled  OCCUPATION: disabled  PLOF: Requires assistive device for independence and Needs assistance with ADLs  PATIENT GOALS: feel better about myself/life, decrease pain  NEXT MD VISIT: next month  OBJECTIVE:   DIAGNOSTIC FINDINGS: none recent  PATIENT SURVEYS:  FOTO Primary measure 26% goal 41%  COGNITION: Overall cognitive status: Within  functional limits for tasks assessed     SENSATION: WFL    MUSCLE LENGTH:  POSTURE: No Significant postural limitations identified. Difficult to assess due to body habitus  PALPATION: No TTP  LOWER EXTREMITY ROM:  Active ROM Right eval Left eval  Hip flexion    Hip extension    Hip abduction    Hip adduction    Hip internal rotation    Hip external rotation    Knee flexion 100 88  Knee extension 0 -5  Ankle dorsiflexion    Ankle plantarflexion    Ankle inversion    Ankle eversion     (Blank rows = not tested)  LOWER EXTREMITY MMT:  MMT Right eval Left eval Right / left 12/13/22  Hip flexion 3+ 3+ 4/5  Hip extension     Hip abduction     Hip adduction     Hip internal rotation     Hip external rotation     Knee flexion 4 4 5-/5  Knee extension 5 5   Ankle dorsiflexion     Ankle plantarflexion     Ankle inversion     Ankle eversion      (Blank rows = not tested)    FUNCTIONAL TESTS:  Timed up and go (TUG): 21.32  12/13/22: 18.01   GAIT: Distance walked: 150 ft Assistive device utilized: Single point cane Level of assistance: Modified independence Comments: antalgic, increased lateral displacement due to decreased knee flex bilaterally, decreased time left in stance   TODAY'S TREATMENT:       5/10  Pt seen for aquatic therapy today.  Treatment took place in water 3.5-4.75 ft in depth at the [facility] pool. Temp of water was 91.  Pt entered/exited the pool via stair using step to pattern with hand rail.   Walking forward and back without UE support  *prone suspension on wall: hip add/abd and cycling *Standing without UE support:  DF then PF; hip add/abd; hip flex/ext; hip circles cw&ccw *hip hinge reaching with barbell. Cues for purposeful movement with concentration on target muscle groups  - resting in squatted position *supine suspension: using noodles and nekdoodle cycling and hip add/abd *SLS rle in 4.0 ft holding x 20 s after  several tries  -lle in 4.3 ft with ue support yellow HB x 20 s after several tries. *TrA sets pushing kb down.   Pt requires the buoyancy and hydrostatic pressure of water for support, and to offload joints by unweighting joint load by at least 50 % in navel deep water and by at least 75-80% in chest to neck deep water.  Viscosity of the water is needed for resistance of strengthening. Water current perturbations provides challenge to standing balance requiring increased core activation.                  PATIENT EDUCATION:  Education details: Discussed eval findings, rehab rationale, aquatic program progression/POC and pools in area. Patient is in agreement  Person educated: Patient  Education method: Chief Technology Officer Education comprehension: verbalized understanding  HOME EXERCISE PROGRAM: Access Code: [email or web address] URL: [email or web address] Prepared by: Geni Bers  This aquatic home exercise program from [employer] utilizes pictures from land based exercises, but has been adapted prior to lamination and issuance.   Exercises - Hand Buoy Carry  - 7 x weekly - 1 x daily - 3 sets - 10 reps - Seated Straddle on Flotation Forward Breast Stroke Arms and Bicycle Legs  - 7 x weekly - 1 x daily - 3 sets - 10 reps - Sit to Stand  - 7 x weekly - 1 x daily - 3 sets - 10 reps - Flutter Kicking/Windshield Wipers  - 7 x weekly - 1 x daily - 3 sets - 10 reps - Side lunge with hand buoys  - 7 x weekly - 1 x daily - 3 sets - 10 reps - Standing Hip Internal and External Rotation  - 7 x weekly - 1 x daily - 3 sets - 10 reps - Standing Hip Hinge  - 7 x weekly - 1 x daily - 3 sets - 10 reps - Squat  - 7 x weekly - 1 x daily - 3 sets - 10 reps - Standing Hip Abduction  - 7 x weekly - 1 x daily - 3 sets - 10 reps - Standing Hip Flexion Extension at [employer]  - 7 x weekly - 1 x daily - 3 sets - 10 reps - Heel Toe Raises at Pool Wall  - 7 x weekly - 1 x daily - 3 sets - 10 reps -  Standing Hip Circles at [employer]  - 7 x weekly - 1 x daily - 3 sets - 10 reps - Standing 3-Way Leg Reach  - 7 x weekly - 1 x daily - 3 sets - 10 reps - Noodle Step Down  - 7 x weekly - 1 x daily - 3 sets - 10 reps  ASSESSMENT:  CLINICAL IMPRESSION: PN: pt has had an overall decrease in knee pain with the skilled physical therapy intervention.  Her Tug test has improved as well as her strength (see charts above) indicating decreased fall risk and improved functional ability.  Pt trialed land based intervention with poor toleration although she has been assigned a HEP which she reports compliance. She has begun looking for pool access for ability to continue with aquatic HEP that is in process of being created.  I do anticipate she will be ready for dc by end of cert. She continues to benefit from skilled PT intervention to improve strength and ROM of knees, improve toleration to activity looking forward to weight loss and TKR.       Initial clinical impression. Patient is a 47 y.o. f who was seen today for physical therapy evaluation and treatment for OA bilat knees.  Pt presents by herself transported to therapy pool via w[facility].  She amb with cane from parking lot into facility. Pt with long med Hx see above.  She is in process of losing weight to become eligible to have bariatric surgery and TKR. She reports high pain in knees (and LB) daily with the sensation that they will buckle under her after walking or standing >3 minutes.  MMT indicated hips weaker than knees.  ROM slightly limited in lle knee.  Difficulty to assess hips and LB due to body habitus.  Gait is antalgic but gait speed and cadence wfl.  She will benefit from skilled  PT intervention for strengthening, gait and balance retraining with intent to DC to Indep completion of aquatic HEP at pool she will gain membership for long term management of chronic condition.  She will also benefit from 2 sessions of land based PT for instruction on  HEP  OBJECTIVE IMPAIRMENTS: decreased activity tolerance, decreased mobility, difficulty walking, decreased ROM, decreased strength, improper body mechanics, obesity, and pain.   ACTIVITY LIMITATIONS: carrying, lifting, bending, sitting, standing, squatting, sleeping, stairs, and transfers  PARTICIPATION LIMITATIONS: meal prep, cleaning, laundry, shopping, community activity, occupation, and yard work  PERSONAL FACTORS: Fitness, Time since onset of injury/illness/exacerbation, and 3+ comorbidities: see above chart  are also affecting patient's functional outcome.   REHAB POTENTIAL: Good  CLINICAL DECISION MAKING: Evolving/moderate complexity  EVALUATION COMPLEXITY: Moderate   GOALS: Goals reviewed with patient? Yes  SHORT TERM GOALS: Target date: 11/21/22 Pt will tolerate full aquatic sessions consistently without increase in pain and with improving function to demonstrate good toleration and effectiveness of intervention.   Baseline: Goal status:Met 11/20/22  2.  Pt to gain access to pool in order to begin completing aquatic HEP for increased progression of function Baseline: none Goal status: in progress 11/20/22  3.  Pt will report decrease in minimal pain to 3/10 or< for improved toleration to activity Baseline: 5/10 Goal status: In progress 11/20/22  4.  Pt will improve L knee flex up to or > 100d and extension to 0d for improved gait. Baseline: see chart Goal status: INITIAL    LONG TERM GOALS: Target date: 12/19/22  Pt to meet stated Foto Goal 41% to demonstrate improved perception of physical ability Baseline: 26 % Goal status: INITIAL  2.  Pt will be indep with final HEP's (land and aquatic as appropriate) for continued management of condition  Baseline: none Goal status: In progress 12/13/22  3.  Pt will improve on Tug test to <or= 14s to demonstrate improvement in lower extremity function, mobility and decreased fall risk. Baseline: 21.32 Goal status: ongoing  12/13/22  4.  Pt will report improvement in ability to stand up to or > 10 minutes Baseline: 3 min Goal status: INITIAL  5.  Pt will improve hip and knee flex strength up to 1 full grade to demonstrate improved overall physical function  Baseline: hips 3+; knee flex 4/5 Goal status: INITIAL   PLAN:  PT FREQUENCY: 1-2x/week  PT DURATION: 8 weeks/12 visits allowing for scheduling conflicts  PLANNED INTERVENTIONS: Therapeutic exercises, Therapeutic activity, Neuromuscular re-education, Balance training, Gait training, Patient/Family education, Self Care, Joint mobilization, Stair training, DME instructions, Aquatic Therapy, Dry Needling, Moist heat, Taping, Ionotophoresis 4mg /ml Dexamethasone, Manual therapy, and Re-evaluation  PLAN FOR NEXT SESSION: Patient has no more land appointments, will schedule    Corrie Dandy Tomma Lightning) Deshanti Adcox MPT 12/13/22  1045am  Addend Corrie Dandy Tomma Lightning) Marlise Fahr MPT 12/13/22  [id number]

## 2022-12-16 ENCOUNTER — Ambulatory Visit (HOSPITAL_BASED_OUTPATIENT_CLINIC_OR_DEPARTMENT_OTHER): Payer: BC Managed Care – PPO | Admitting: Physical Therapy

## 2022-12-16 ENCOUNTER — Encounter (HOSPITAL_BASED_OUTPATIENT_CLINIC_OR_DEPARTMENT_OTHER): Payer: Self-pay | Admitting: Physical Therapy

## 2022-12-16 DIAGNOSIS — M6281 Muscle weakness (generalized): Secondary | ICD-10-CM

## 2022-12-16 DIAGNOSIS — M25561 Pain in right knee: Secondary | ICD-10-CM | POA: Diagnosis not present

## 2022-12-16 DIAGNOSIS — M25562 Pain in left knee: Secondary | ICD-10-CM | POA: Diagnosis not present

## 2022-12-16 DIAGNOSIS — G8929 Other chronic pain: Secondary | ICD-10-CM

## 2022-12-16 DIAGNOSIS — R262 Difficulty in walking, not elsewhere classified: Secondary | ICD-10-CM

## 2022-12-16 NOTE — Therapy (Signed)
OUTPATIENT PHYSICAL THERAPY LOWER EXTREMITY TREATMENT     Patient Name: Phyllis Calhoun MRN: [id number] DOB:1976/05/03, 47 y.o., female Today's Date: 12/16/2022  END OF SESSION:  PT End of Session - 12/16/22 0946     Visit Number 11    Number of Visits 12    Date for PT Re-Evaluation 12/19/22    Authorization Type BC[employer]    PT Start Time 0940    PT Stop Time 1025    PT Time Calculation (min) 45 min    Activity Tolerance Patient tolerated treatment well    Behavior During Therapy WFL for tasks assessed/performed              Past Medical History:  Diagnosis Date   Arthritis    both legs   Gallstones    HX OF SEVERAL GALLBLADDER ATTACKS   Headache(784.0)    PT STATES MIGRAINES FROM TRIGEMINAL NEURALGIA   Sleep apnea    sleep study confirmed   Trigeminal neuralgia    2012-PT CONTINUES TO HAVE PAIN RIGHT SIDE OF FACE   Past Surgical History:  Procedure Laterality Date   ABDOMINAL HYSTERECTOMY     BREAST REDUCTION SURGERY     CHOLECYSTECTOMY N/A 05/25/2013   Procedure: LAPAROSCOPIC CHOLECYSTECTOMY WITH INTRAOPERATIVE CHOLANGIOGRAM;  Surgeon: Axel Filler, MD;  Location: [facility];  Service: General;  Laterality: N/A;   KNEE ARTHROSCOPY Left 12/29/2015   Procedure: ARTHROSCOPY KNEE;  Surgeon: Jodi Geralds, MD;  Location: [facility];  Service: Orthopedics;  Laterality: Left;   TONSILLECTOMY     Patient Active Problem List   Diagnosis Date Noted   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   Loud snoring 11/05/2018   OSA (obstructive sleep apnea) 11/05/2018   Chest pain 01/20/2016   Trigeminal neuralgia of right side of face 01/20/2016   Shortness of breath 01/20/2016   Acute medial meniscus tear of left knee 12/29/2015   Acute lateral meniscus tear of left knee 12/29/2015   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   Super obesity 12/29/2015    PCP: Camie Patience, FNP  REFERRING PROVIDER: Camie Patience, FNP   REFERRING DIAG: M17.0  (ICD-10-CM) - Bilateral primary osteoarthritis of knee   THERAPY DIAG:  Muscle weakness (generalized)  Difficulty in walking, not elsewhere classified  Chronic pain of both knees  Rationale for Evaluation and Treatment: Rehabilitation  ONSET DATE: >5 yrs  SUBJECTIVE:   SUBJECTIVE STATEMENT: doing well.  No pain right knee coming in today, left 6/10   Doing bariatric treatment. Wants TKR working on losing weight. Aug 2022 surgery for trigeminal nueraliga.  Had complications and is still recovering. Using cane.  Has had back surgery 2023 lumbar spine. Knee gel injection x 3 each knees over last couple of months without improvement. Knees become weak with standing then back will start to hurt. Pt reports doing water aerobics back in 2020 lost some weight. Pt reports waiting for appt for ablation in knee.   PERTINENT HISTORY:  PAIN:  Are you having pain? Yes: NPRS scale: current 6/10; worst 7/10 least 5/10 Pain location: knees Pain description: achy when I sleep Aggravating factors: lying, standing, walking x 3 minutes Relieving factors: sitting, resting, heat  PRECAUTIONS: Knee and Fall  WEIGHT BEARING RESTRICTIONS: No  FALLS:  Has patient fallen in last 6 months? No  LIVING ENVIRONMENT: Lives with: lives with their family, lives with their spouse, and mother and father Lives in: House/apartment Stairs: Yes: Internal: 16 steps; on right going up Has following  equipment at home: Single point cane and Walker - 2 wheeled  OCCUPATION: disabled  PLOF: Requires assistive device for independence and Needs assistance with ADLs  PATIENT GOALS: feel better about myself/life, decrease pain  NEXT MD VISIT: next month  OBJECTIVE:   DIAGNOSTIC FINDINGS: none recent  PATIENT SURVEYS:  FOTO Primary measure 26% goal 41%  COGNITION: Overall cognitive status: Within functional limits for tasks assessed     SENSATION: WFL    MUSCLE LENGTH:  POSTURE: No Significant  postural limitations identified. Difficult to assess due to body habitus  PALPATION: No TTP  LOWER EXTREMITY ROM:  Active ROM Right eval Left eval  Hip flexion    Hip extension    Hip abduction    Hip adduction    Hip internal rotation    Hip external rotation    Knee flexion 100 88  Knee extension 0 -5  Ankle dorsiflexion    Ankle plantarflexion    Ankle inversion    Ankle eversion     (Blank rows = not tested)  LOWER EXTREMITY MMT:  MMT Right eval Left eval Right / left 12/13/22  Hip flexion 3+ 3+ 4/5  Hip extension     Hip abduction     Hip adduction     Hip internal rotation     Hip external rotation     Knee flexion 4 4 5-/5  Knee extension 5 5   Ankle dorsiflexion     Ankle plantarflexion     Ankle inversion     Ankle eversion      (Blank rows = not tested)    FUNCTIONAL TESTS:  Timed up and go (TUG): 21.32  12/13/22: 18.01   GAIT: Distance walked: 150 ft Assistive device utilized: Single point cane Level of assistance: Modified independence Comments: antalgic, increased lateral displacement due to decreased knee flex bilaterally, decreased time left in stance   TODAY'S TREATMENT:       5/13  Pt seen for aquatic therapy today.  Treatment took place in water 3.5-4.75 ft in depth at the [facility] pool. Temp of water was 91.  Pt entered/exited the pool via stair using step to pattern with hand rail.   Exercises completed: - Hand Buoy Carry  - 7 x weekly - 1 x daily - 3 sets - 10 reps - Seated Straddle on Flotation Forward Breast Stroke Arms and Bicycle Legs  - 7 x weekly - 1 x daily - 3 sets - 10 reps - Sit to Stand   - Flutter Kicking/Windshield Wipers   - Side lunge with hand buoys   - Standing Hip Internal and External Rotation   - Standing Hip Hinge   - Squat    - Standing Hip Abduction   - Standing Hip Flexion Extension at [employer]   - Heel Toe Raises at [employer]   - Standing Hip Circles at [employer]   - Standing 3-Way  Leg Reach   - Noodle Step Down     Pt requires the buoyancy and hydrostatic pressure of water for support, and to offload joints by unweighting joint load by at least 50 % in navel deep water and by at least 75-80% in chest to neck deep water.  Viscosity of the water is needed for resistance of strengthening. Water current perturbations provides challenge to standing balance requiring increased core activation.                  PATIENT EDUCATION:  Education details:  Discussed eval findings, rehab rationale, aquatic program progression/POC and pools in area. Patient is in agreement  Person educated: Patient Education method: Explanation and Handouts Education comprehension: verbalized understanding  HOME EXERCISE PROGRAM: Access Code: [email or web address] URL: [email or web address] Prepared by: Geni Bers  This aquatic home exercise program from MedBridge utilizes pictures from land based exercises, but has been adapted prior to lamination and issuance.   Exercises - Hand Buoy Carry  - 7 x weekly - 1 x daily - 3 sets - 10 reps - Seated Straddle on Flotation Forward Breast Stroke Arms and Bicycle Legs  - 7 x weekly - 1 x daily - 3 sets - 10 reps - Sit to Stand  - 7 x weekly - 1 x daily - 3 sets - 10 reps - Flutter Kicking/Windshield Wipers  - 7 x weekly - 1 x daily - 3 sets - 10 reps - Side lunge with hand buoys  - 7 x weekly - 1 x daily - 3 sets - 10 reps - Standing Hip Internal and External Rotation  - 7 x weekly - 1 x daily - 3 sets - 10 reps - Standing Hip Hinge  - 7 x weekly - 1 x daily - 3 sets - 10 reps - Squat  - 7 x weekly - 1 x daily - 3 sets - 10 reps - Standing Hip Abduction  - 7 x weekly - 1 x daily - 3 sets - 10 reps - Standing Hip Flexion Extension at [employer]  - 7 x weekly - 1 x daily - 3 sets - 10 reps - Heel Toe Raises at Pool Wall  - 7 x weekly - 1 x daily - 3 sets - 10 reps - Standing Hip Circles at [employer]  - 7 x weekly - 1 x daily - 3 sets - 10 reps -  Standing 3-Way Leg Reach  - 7 x weekly - 1 x daily - 3 sets - 10 reps - Noodle Step Down  - 7 x weekly - 1 x daily - 3 sets - 10 reps  ASSESSMENT:  CLINICAL IMPRESSION: Pt instructed on created and laminated final aquatic HEP.  Copy issued.  She has gained acces [facility] [place] [facility] and is instructed to complete HEP indep before return so we can address any difficulties or questions.  She VU.  She tolerates fairly well although does report slight increase in right knee irritation as session progresses. She will be ready for DC next session.    PN: pt has had an overall decrease in knee pain with the skilled physical therapy intervention.  Her Tug test has improved as well as her strength (see charts above) indicating decreased fall risk and improved functional ability.  Pt trialed land based intervention with poor toleration although she has been assigned a HEP which she reports compliance. She has begun looking for pool access for ability to continue with aquatic HEP that is in process of being created.  I do anticipate she will be ready for dc by end of cert. She continues to benefit from skilled PT intervention to improve strength and ROM of knees, improve toleration to activity looking forward to weight loss and TKR.       Initial clinical impression. Patient is a 47 y.o. f who was seen today for physical therapy evaluation and treatment for OA bilat knees.  Pt presents by herself transported to therapy pool via wc.  She amb with cane from parking  lot into facility. Pt with long med Hx see above.  She is in process of losing weight to become eligible to have bariatric surgery and TKR. She reports high pain in knees (and LB) daily with the sensation that they will buckle under her after walking or standing >3 minutes.  MMT indicated hips weaker than knees.  ROM slightly limited in lle knee.  Difficulty to assess hips and LB due to body habitus.  Gait is antalgic but gait speed  and cadence wfl.  She will benefit from skilled PT intervention for strengthening, gait and balance retraining with intent to DC to Indep completion of aquatic HEP at pool she will gain membership for long term management of chronic condition.  She will also benefit from 2 sessions of land based PT for instruction on HEP  OBJECTIVE IMPAIRMENTS: decreased activity tolerance, decreased mobility, difficulty walking, decreased ROM, decreased strength, improper body mechanics, obesity, and pain.   ACTIVITY LIMITATIONS: carrying, lifting, bending, sitting, standing, squatting, sleeping, stairs, and transfers  PARTICIPATION LIMITATIONS: meal prep, cleaning, laundry, shopping, community activity, occupation, and yard work  PERSONAL FACTORS: Fitness, Time since onset of injury/illness/exacerbation, and 3+ comorbidities: see above chart  are also affecting patient's functional outcome.   REHAB POTENTIAL: Good  CLINICAL DECISION MAKING: Evolving/moderate complexity  EVALUATION COMPLEXITY: Moderate   GOALS: Goals reviewed with patient? Yes  SHORT TERM GOALS: Target date: 11/21/22 Pt will tolerate full aquatic sessions consistently without increase in pain and with improving function to demonstrate good toleration and effectiveness of intervention.   Baseline: Goal status:Met 11/20/22  2.  Pt to gain access to pool in order to begin completing aquatic HEP for increased progression of function Baseline: none Goal status: in progress 11/20/22  3.  Pt will report decrease in minimal pain to 3/10 or< for improved toleration to activity Baseline: 5/10 Goal status: In progress 11/20/22  4.  Pt will improve L knee flex up to or > 100d and extension to 0d for improved gait. Baseline: see chart Goal status: INITIAL    LONG TERM GOALS: Target date: 12/19/22  Pt to meet stated Foto Goal 41% to demonstrate improved perception of physical ability Baseline: 26 % Goal status: INITIAL  2.  Pt will be  indep with final HEP's (land and aquatic as appropriate) for continued management of condition  Baseline: none Goal status: In progress 12/13/22  3.  Pt will improve on Tug test to <or= 14s to demonstrate improvement in lower extremity function, mobility and decreased fall risk. Baseline: 21.32 Goal status: ongoing 12/13/22  4.  Pt will report improvement in ability to stand up to or > 10 minutes Baseline: 3 min Goal status: INITIAL  5.  Pt will improve hip and knee flex strength up to 1 full grade to demonstrate improved overall physical function  Baseline: hips 3+; knee flex 4/5 Goal status: INITIAL   PLAN:  PT FREQUENCY: 1-2x/week  PT DURATION: 8 weeks/12 visits allowing for scheduling conflicts  PLANNED INTERVENTIONS: Therapeutic exercises, Therapeutic activity, Neuromuscular re-education, Balance training, Gait training, Patient/Family education, Self Care, Joint mobilization, Stair training, DME instructions, Aquatic Therapy, Dry Needling, Moist heat, Taping, Ionotophoresis 4mg /ml Dexamethasone, Manual therapy, and Re-evaluation  PLAN FOR NEXT SESSION: Patient has no more land appointments, will schedule    Corrie Dandy Tomma Lightning) Arden Axon MPT 12/13/22  1045am  Addend Corrie Dandy Tomma Lightning) Delanie Tirrell MPT 12/13/22  [id number]

## 2022-12-17 DIAGNOSIS — R7303 Prediabetes: Secondary | ICD-10-CM | POA: Diagnosis not present

## 2022-12-17 DIAGNOSIS — Z6841 Body Mass Index (BMI) 40.0 and over, adult: Secondary | ICD-10-CM | POA: Diagnosis not present

## 2022-12-20 ENCOUNTER — Ambulatory Visit (HOSPITAL_BASED_OUTPATIENT_CLINIC_OR_DEPARTMENT_OTHER): Payer: BC Managed Care – PPO | Admitting: Physical Therapy

## 2022-12-20 ENCOUNTER — Encounter (HOSPITAL_BASED_OUTPATIENT_CLINIC_OR_DEPARTMENT_OTHER): Payer: Self-pay | Admitting: Physical Therapy

## 2022-12-20 DIAGNOSIS — M6281 Muscle weakness (generalized): Secondary | ICD-10-CM

## 2022-12-20 DIAGNOSIS — M25561 Pain in right knee: Secondary | ICD-10-CM | POA: Diagnosis not present

## 2022-12-20 DIAGNOSIS — R262 Difficulty in walking, not elsewhere classified: Secondary | ICD-10-CM | POA: Diagnosis not present

## 2022-12-20 DIAGNOSIS — G8929 Other chronic pain: Secondary | ICD-10-CM

## 2022-12-20 DIAGNOSIS — M25562 Pain in left knee: Secondary | ICD-10-CM | POA: Diagnosis not present

## 2022-12-20 NOTE — Therapy (Signed)
OUTPATIENT PHYSICAL THERAPY LOWER EXTREMITY  Discharge  PHYSICAL THERAPY DISCHARGE SUMMARY  Visits from Start of Care: 12  Current functional level related to goals / functional outcomes: Pt is indep with AD's   Remaining deficits: Chronic knee pain due to OA   Education / Equipment: Management of condition/ HEP   Patient agrees to discharge. Patient goals were partially met. Patient is being discharged due to maximized rehab potential.       Patient Name: Phyllis Calhoun MRN: [id number] DOB:[birth year], 47 y.o., female Today's Date: 12/20/2022  END OF SESSION:  PT End of Session - 12/20/22 1028     Visit Number 12    Number of Visits 12    Date for PT Re-Evaluation 12/20/22    Authorization Type [employer]    PT Start Time [phone]    PT Stop Time 1025    PT Time Calculation (min) 38 min    Activity Tolerance Patient tolerated treatment well    Behavior During Therapy WFL for tasks assessed/performed              Past Medical History:  Diagnosis Date   Arthritis    both legs   Gallstones    HX OF SEVERAL GALLBLADDER ATTACKS   Headache(784.0)    PT STATES MIGRAINES FROM TRIGEMINAL NEURALGIA   Sleep apnea    sleep study confirmed   Trigeminal neuralgia    2012-PT CONTINUES TO HAVE PAIN RIGHT SIDE OF FACE   Past Surgical History:  Procedure Laterality Date   ABDOMINAL HYSTERECTOMY     BREAST REDUCTION SURGERY     CHOLECYSTECTOMY N/A 05/25/2013   Procedure: LAPAROSCOPIC CHOLECYSTECTOMY WITH INTRAOPERATIVE CHOLANGIOGRAM;  Surgeon: Axel Filler, MD;  Location: [facility];  Service: General;  Laterality: N/A;   KNEE ARTHROSCOPY Left 12/29/2015   Procedure: ARTHROSCOPY KNEE;  Surgeon: Jodi Geralds, MD;  Location: [facility];  Service: Orthopedics;  Laterality: Left;   TONSILLECTOMY     Patient Active Problem List   Diagnosis Date Noted   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   Loud snoring 11/05/2018   OSA (obstructive sleep apnea)  11/05/2018   Chest pain 01/20/2016   Trigeminal neuralgia of right side of face 01/20/2016   Shortness of breath 01/20/2016   Acute medial meniscus tear of left knee 12/29/2015   Acute lateral meniscus tear of left knee 12/29/2015   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   Super obesity 12/29/2015    PCP: Camie Patience, FNP  REFERRING PROVIDER: Camie Patience, FNP   REFERRING DIAG: M17.0 (ICD-10-CM) - Bilateral primary osteoarthritis of knee   THERAPY DIAG:  Muscle weakness (generalized)  Difficulty in walking, not elsewhere classified  Chronic pain of both knees  Rationale for Evaluation and Treatment: Rehabilitation  ONSET DATE: >5 yrs  SUBJECTIVE:   SUBJECTIVE STATEMENT: "Have knee ablation next week   Doing bariatric treatment. Wants TKR working on losing weight. Aug 2022 surgery for trigeminal nueraliga.  Had complications and is still recovering. Using cane.  Has had back surgery 2023 lumbar spine. Knee gel injection x 3 each knees over last couple of months without improvement. Knees become weak with standing then back will start to hurt. Pt reports doing water aerobics back in 2020 lost some weight. Pt reports waiting for appt for ablation in knee.   PERTINENT HISTORY:  PAIN:  Are you having pain? Yes: NPRS scale: current 6/10; worst 7/10 least 5/10 Pain location: knees Pain description: achy when I sleep Aggravating factors: lying,  standing, walking x 3 minutes Relieving factors: sitting, resting, heat  PRECAUTIONS: Knee and Fall  WEIGHT BEARING RESTRICTIONS: No  FALLS:  Has patient fallen in last 6 months? No  LIVING ENVIRONMENT: Lives with: lives with their family, lives with their spouse, and mother and father Lives in: House/apartment Stairs: Yes: Internal: 16 steps; on right going up Has following equipment at home: Single point cane and Walker - 2 wheeled  OCCUPATION: disabled  PLOF: Requires assistive device for independence and Needs  assistance with ADLs  PATIENT GOALS: feel better about myself/life, decrease pain  NEXT MD VISIT: next month  OBJECTIVE:   DIAGNOSTIC FINDINGS: none recent  PATIENT SURVEYS:  FOTO Primary measure 26% goal 41%  COGNITION: Overall cognitive status: Within functional limits for tasks assessed     SENSATION: WFL    MUSCLE LENGTH:  POSTURE: No Significant postural limitations identified. Difficult to assess due to body habitus  PALPATION: No TTP  LOWER EXTREMITY ROM:  Active ROM Right eval Left eval Left 12/20/22  Hip flexion     Hip extension     Hip abduction     Hip adduction     Hip internal rotation     Hip external rotation     Knee flexion 100 88 95  Knee extension 0 -5 0  Ankle dorsiflexion     Ankle plantarflexion     Ankle inversion     Ankle eversion      (Blank rows = not tested)  LOWER EXTREMITY MMT:  MMT Right eval Left eval Right / left 12/13/22 Right /Left 12/20/22  Hip flexion 3+ 3+ 4/5 4+/5  Hip extension      Hip abduction      Hip adduction      Hip internal rotation      Hip external rotation      Knee flexion 4 4 5-/5   Knee extension 5 5    Ankle dorsiflexion      Ankle plantarflexion      Ankle inversion      Ankle eversion       (Blank rows = not tested)    FUNCTIONAL TESTS:  Timed up and go (TUG): 21.32  12/13/22: 18.01   GAIT: Distance walked: 150 ft Assistive device utilized: Single point cane Level of assistance: Modified independence Comments: antalgic, increased lateral displacement due to decreased knee flex bilaterally, decreased time left in stance   TODAY'S TREATMENT:       5/13  Pt seen for aquatic therapy today.  Treatment took place in water 3.5-4.75 ft in depth at the [facility] pool. Temp of water was 91.  Pt entered/exited the pool via stair using step to pattern with hand rail.   Exercises completed: - Hand Buoy Carry  - 7 x weekly - 1 x daily - 3 sets - 10 reps - Seated Straddle on  Flotation Forward Breast Stroke Arms and Bicycle Legs  - 7 x weekly - 1 x daily - 3 sets - 10 reps - Sit to Stand   - Flutter Kicking/Windshield Wipers   - Side lunge with hand buoys   - Standing Hip Internal and External Rotation   - Standing Hip Hinge   - Squat    - Standing Hip Abduction   - Standing Hip Flexion Extension at [employer]   - Heel Toe Raises at [employer]   - Standing Hip Circles at [facility]   - Standing 3-Way Leg Reach   -  Noodle Step Down     Pt requires the buoyancy and hydrostatic pressure of water for support, and to offload joints by unweighting joint load by at least 50 % in navel deep water and by at least 75-80% in chest to neck deep water.  Viscosity of the water is needed for resistance of strengthening. Water current perturbations provides challenge to standing balance requiring increased core activation.                  PATIENT EDUCATION:  Education details: Discussed eval findings, rehab rationale, aquatic program progression/POC and pools in area. Patient is in agreement  Person educated: Patient Education method: Explanation and Handouts Education comprehension: verbalized understanding  HOME EXERCISE PROGRAM: Access Code: [email or web address] URL: [email or web address] Prepared by: Geni Bers  This aquatic home exercise program from Med[employer] utilizes pictures from land based exercises, but has been adapted prior to lamination and issuance.   Exercises - Hand Buoy Carry  - 7 x weekly - 1 x daily - 3 sets - 10 reps - Seated Straddle on Flotation Forward Breast Stroke Arms and Bicycle Legs  - 7 x weekly - 1 x daily - 3 sets - 10 reps - Sit to Stand  - 7 x weekly - 1 x daily - 3 sets - 10 reps - Flutter Kicking/Windshield Wipers  - 7 x weekly - 1 x daily - 3 sets - 10 reps - Side lunge with hand buoys  - 7 x weekly - 1 x daily - 3 sets - 10 reps - Standing Hip Internal and External Rotation  - 7 x weekly - 1 x daily - 3 sets - 10 reps -  Standing Hip Hinge  - 7 x weekly - 1 x daily - 3 sets - 10 reps - Squat  - 7 x weekly - 1 x daily - 3 sets - 10 reps - Standing Hip Abduction  - 7 x weekly - 1 x daily - 3 sets - 10 reps - Standing Hip Flexion Extension at [employer]  - 7 x weekly - 1 x daily - 3 sets - 10 reps - Heel Toe Raises at Pool Wall  - 7 x weekly - 1 x daily - 3 sets - 10 reps - Standing Hip Circles at [employer]  - 7 x weekly - 1 x daily - 3 sets - 10 reps - Standing 3-Way Leg Reach  - 7 x weekly - 1 x daily - 3 sets - 10 reps - Noodle Step Down  - 7 x weekly - 1 x daily - 3 sets - 10 reps  ASSESSMENT:  CLINICAL IMPRESSION: Pt has progressed well towards goals but has not been able to meet them all due to chronic OA in knees, pain and morbid obesity.  She has gained strength, ROM, increased mobility and decreased intensity of pain with skilled aquatic physical therapy intervention.  She has gained access to a pool and demonstrates indep with final HEP.  She did trial land based intervention which she did not tolerate.  She is encouraged to continue with aquatic HEP and work toward decreasing BMI to be a candidate for TKR.  She is highly motivated and has been a joy to work with. I anticipate she will have a decrease in knee pain with the scheduled ablations.  Pt has reached her max potential in this setting.     Initial clinical impression. Patient is a 47 y.o. f who was seen today for physical therapy  evaluation and treatment for OA bilat knees.  Pt presents by herself transported to therapy pool via wc.  She amb with cane from parking lot into facility. Pt with long med Hx see above.  She is in process of losing weight to become eligible to have bariatric surgery and TKR. She reports high pain in knees (and LB) daily with the sensation that they will buckle under her after walking or standing >3 minutes.  MMT indicated hips weaker than knees.  ROM slightly limited in lle knee.  Difficulty to assess hips and LB due to body  habitus.  Gait is antalgic but gait speed and cadence wfl.  She will benefit from skilled PT intervention for strengthening, gait and balance retraining with intent to DC to Indep completion of aquatic HEP at pool she will gain membership for long term management of chronic condition.  She will also benefit from 2 sessions of land based PT for instruction on HEP  OBJECTIVE IMPAIRMENTS: decreased activity tolerance, decreased mobility, difficulty walking, decreased ROM, decreased strength, improper body mechanics, obesity, and pain.   ACTIVITY LIMITATIONS: carrying, lifting, bending, sitting, standing, squatting, sleeping, stairs, and transfers  PARTICIPATION LIMITATIONS: meal prep, cleaning, laundry, shopping, community activity, occupation, and yard work  PERSONAL FACTORS: Fitness, Time since onset of injury/illness/exacerbation, and 3+ comorbidities: see above chart  are also affecting patient's functional outcome.   REHAB POTENTIAL: Good  CLINICAL DECISION MAKING: Evolving/moderate complexity  EVALUATION COMPLEXITY: Moderate   GOALS: Goals reviewed with patient? Yes  SHORT TERM GOALS: Target date: 11/21/22 Pt will tolerate full aquatic sessions consistently without increase in pain and with improving function to demonstrate good toleration and effectiveness of intervention.   Baseline: Goal status:Met 11/20/22  2.  Pt to gain access to pool in order to begin completing aquatic HEP for increased progression of function Baseline: none Goal status: in progress 11/20/22 ;  Met 12/20/22  3.  Pt will report decrease in minimal pain to 3/10 or< for improved toleration to activity Baseline: 5/10 Goal status: In progress 11/20/22:  Met 12/20/22  4.  Pt will improve L knee flex up to or > 100d and extension to 0d for improved gait. Baseline: see chart Goal status: Partially met    LONG TERM GOALS: Target date: 12/19/22  Pt to meet stated Foto Goal 41% to demonstrate improved perception of  physical ability Baseline: 26 % Goal status: deferred  2.  Pt will be indep with final HEP's (land and aquatic as appropriate) for continued management of condition  Baseline: none Goal status: In progress 12/13/22:  Met 12/20/22  3.  Pt will improve on Tug test to <or= 14s to demonstrate improvement in lower extremity function, mobility and decreased fall risk. Baseline: 21.32 Goal status: ongoing 12/13/22: Not met 12/20/22  4.  Pt will report improvement in ability to stand up to or > 10 minutes Baseline: 3 min Goal status: Not Met 12/20/22  >5 minutes  5.  Pt will improve hip and knee flex strength up to 1 full grade to demonstrate improved overall physical function  Baseline: hips 3+; knee flex 4/5 Goal status: Met 12/20/22   PLAN:  PT FREQUENCY: 1   PT DURATION:1day  PLANNED INTERVENTIONS: Therapeutic exercises, Therapeutic activity, Neuromuscular re-education, Balance training, Gait training, Patient/Family education, Self Care, Joint mobilization, Stair training, DME instructions, Aquatic Therapy, Dry Needling, Moist heat, Taping, Ionotophoresis 4mg /ml Dexamethasone, Manual therapy, and Re-evaluation  PLAN FOR NEXT SESSION: Instruct and ensure indep with final HEP/DC  Corrie Dandy (Frankie) Arlethia Basso MPT 12/20/22 202p

## 2022-12-23 DIAGNOSIS — M25562 Pain in left knee: Secondary | ICD-10-CM | POA: Diagnosis not present

## 2022-12-23 DIAGNOSIS — G8929 Other chronic pain: Secondary | ICD-10-CM | POA: Diagnosis not present

## 2022-12-24 DIAGNOSIS — Z7409 Other reduced mobility: Secondary | ICD-10-CM | POA: Diagnosis not present

## 2022-12-24 DIAGNOSIS — M25569 Pain in unspecified knee: Secondary | ICD-10-CM | POA: Diagnosis not present

## 2022-12-24 DIAGNOSIS — M549 Dorsalgia, unspecified: Secondary | ICD-10-CM | POA: Diagnosis not present

## 2022-12-24 DIAGNOSIS — R6889 Other general symptoms and signs: Secondary | ICD-10-CM | POA: Diagnosis not present

## 2022-12-26 DIAGNOSIS — M1712 Unilateral primary osteoarthritis, left knee: Secondary | ICD-10-CM | POA: Diagnosis not present

## 2022-12-26 DIAGNOSIS — R7303 Prediabetes: Secondary | ICD-10-CM | POA: Diagnosis not present

## 2022-12-26 DIAGNOSIS — G894 Chronic pain syndrome: Secondary | ICD-10-CM | POA: Diagnosis not present

## 2023-01-03 DIAGNOSIS — M47816 Spondylosis without myelopathy or radiculopathy, lumbar region: Secondary | ICD-10-CM | POA: Diagnosis not present

## 2023-01-03 DIAGNOSIS — G500 Trigeminal neuralgia: Secondary | ICD-10-CM | POA: Diagnosis not present

## 2023-01-03 DIAGNOSIS — G8929 Other chronic pain: Secondary | ICD-10-CM | POA: Diagnosis not present

## 2023-01-03 DIAGNOSIS — F419 Anxiety disorder, unspecified: Secondary | ICD-10-CM | POA: Diagnosis not present

## 2023-01-06 DIAGNOSIS — Z6841 Body Mass Index (BMI) 40.0 and over, adult: Secondary | ICD-10-CM | POA: Diagnosis not present

## 2023-01-06 DIAGNOSIS — Z713 Dietary counseling and surveillance: Secondary | ICD-10-CM | POA: Diagnosis not present

## 2023-01-07 DIAGNOSIS — Z6841 Body Mass Index (BMI) 40.0 and over, adult: Secondary | ICD-10-CM | POA: Diagnosis not present

## 2023-01-07 DIAGNOSIS — Z7189 Other specified counseling: Secondary | ICD-10-CM | POA: Diagnosis not present

## 2023-01-09 ENCOUNTER — Other Ambulatory Visit: Payer: Self-pay

## 2023-01-10 ENCOUNTER — Other Ambulatory Visit: Payer: Self-pay

## 2023-01-10 DIAGNOSIS — K219 Gastro-esophageal reflux disease without esophagitis: Secondary | ICD-10-CM | POA: Diagnosis not present

## 2023-01-10 MED ORDER — HYDROCODONE-ACETAMINOPHEN 10-325 MG PO TABS
1.0000 | ORAL_TABLET | Freq: Four times a day (QID) | ORAL | 0 refills | Status: DC | PRN
  Filled 2023-01-10: qty 120, 30d supply, fill #0

## 2023-01-27 DIAGNOSIS — G8929 Other chronic pain: Secondary | ICD-10-CM | POA: Diagnosis not present

## 2023-01-27 DIAGNOSIS — M25561 Pain in right knee: Secondary | ICD-10-CM | POA: Diagnosis not present

## 2023-01-27 DIAGNOSIS — M170 Bilateral primary osteoarthritis of knee: Secondary | ICD-10-CM | POA: Diagnosis not present

## 2023-01-27 DIAGNOSIS — M25562 Pain in left knee: Secondary | ICD-10-CM | POA: Diagnosis not present

## 2023-02-04 ENCOUNTER — Other Ambulatory Visit: Payer: Self-pay

## 2023-02-04 MED ORDER — HYDROCODONE-ACETAMINOPHEN 10-325 MG PO TABS
1.0000 | ORAL_TABLET | Freq: Four times a day (QID) | ORAL | 0 refills | Status: AC | PRN
  Filled 2023-03-05: qty 120, 30d supply, fill #0
  Filled ????-??-??: fill #0

## 2023-02-04 MED ORDER — HYDROCODONE-ACETAMINOPHEN 10-325 MG PO TABS
1.0000 | ORAL_TABLET | Freq: Four times a day (QID) | ORAL | 0 refills | Status: AC | PRN
  Filled 2023-04-01: qty 120, 30d supply, fill #0

## 2023-02-05 ENCOUNTER — Other Ambulatory Visit: Payer: Self-pay

## 2023-02-05 MED ORDER — HYDROCODONE-ACETAMINOPHEN 10-325 MG PO TABS
1.0000 | ORAL_TABLET | Freq: Four times a day (QID) | ORAL | 0 refills | Status: AC | PRN
  Filled 2023-02-05: qty 120, 30d supply, fill #0

## 2023-02-10 ENCOUNTER — Other Ambulatory Visit: Payer: Self-pay

## 2023-02-14 ENCOUNTER — Other Ambulatory Visit: Payer: Self-pay

## 2023-02-14 DIAGNOSIS — K219 Gastro-esophageal reflux disease without esophagitis: Secondary | ICD-10-CM | POA: Diagnosis not present

## 2023-02-14 DIAGNOSIS — G894 Chronic pain syndrome: Secondary | ICD-10-CM | POA: Diagnosis not present

## 2023-02-14 DIAGNOSIS — R7303 Prediabetes: Secondary | ICD-10-CM | POA: Diagnosis not present

## 2023-02-14 DIAGNOSIS — A048 Other specified bacterial intestinal infections: Secondary | ICD-10-CM | POA: Diagnosis not present

## 2023-02-14 DIAGNOSIS — Z6841 Body Mass Index (BMI) 40.0 and over, adult: Secondary | ICD-10-CM | POA: Diagnosis not present

## 2023-02-17 DIAGNOSIS — Z7409 Other reduced mobility: Secondary | ICD-10-CM | POA: Diagnosis not present

## 2023-02-17 DIAGNOSIS — Z136 Encounter for screening for cardiovascular disorders: Secondary | ICD-10-CM | POA: Diagnosis not present

## 2023-02-17 DIAGNOSIS — R7303 Prediabetes: Secondary | ICD-10-CM | POA: Diagnosis not present

## 2023-02-17 DIAGNOSIS — Z6841 Body Mass Index (BMI) 40.0 and over, adult: Secondary | ICD-10-CM | POA: Diagnosis not present

## 2023-02-17 DIAGNOSIS — G4733 Obstructive sleep apnea (adult) (pediatric): Secondary | ICD-10-CM | POA: Diagnosis not present

## 2023-02-17 DIAGNOSIS — Z01818 Encounter for other preprocedural examination: Secondary | ICD-10-CM | POA: Diagnosis not present

## 2023-02-20 DIAGNOSIS — R7303 Prediabetes: Secondary | ICD-10-CM | POA: Diagnosis not present

## 2023-02-20 DIAGNOSIS — G894 Chronic pain syndrome: Secondary | ICD-10-CM | POA: Diagnosis not present

## 2023-02-20 DIAGNOSIS — Z6841 Body Mass Index (BMI) 40.0 and over, adult: Secondary | ICD-10-CM | POA: Diagnosis not present

## 2023-02-25 DIAGNOSIS — G500 Trigeminal neuralgia: Secondary | ICD-10-CM | POA: Diagnosis not present

## 2023-02-25 DIAGNOSIS — G5131 Clonic hemifacial spasm, right: Secondary | ICD-10-CM | POA: Diagnosis not present

## 2023-03-05 ENCOUNTER — Other Ambulatory Visit: Payer: Self-pay

## 2023-03-05 MED ORDER — MELOXICAM 15 MG PO TABS
15.0000 mg | ORAL_TABLET | Freq: Every day | ORAL | 2 refills | Status: DC
  Filled 2023-03-05: qty 30, 30d supply, fill #0
  Filled 2023-04-01: qty 30, 30d supply, fill #1

## 2023-03-11 ENCOUNTER — Other Ambulatory Visit: Payer: Self-pay

## 2023-03-23 ENCOUNTER — Emergency Department (HOSPITAL_BASED_OUTPATIENT_CLINIC_OR_DEPARTMENT_OTHER): Payer: BC Managed Care – PPO

## 2023-03-23 ENCOUNTER — Encounter (HOSPITAL_BASED_OUTPATIENT_CLINIC_OR_DEPARTMENT_OTHER): Payer: Self-pay | Admitting: Emergency Medicine

## 2023-03-23 ENCOUNTER — Other Ambulatory Visit: Payer: Self-pay

## 2023-03-23 ENCOUNTER — Emergency Department (HOSPITAL_BASED_OUTPATIENT_CLINIC_OR_DEPARTMENT_OTHER)
Admission: EM | Admit: 2023-03-23 | Discharge: 2023-03-23 | Disposition: A | Discharge status: Home / Self Care | Payer: BC Managed Care – PPO | Attending: Emergency Medicine | Admitting: Emergency Medicine

## 2023-03-23 DIAGNOSIS — R519 Headache, unspecified: Secondary | ICD-10-CM | POA: Diagnosis not present

## 2023-03-23 DIAGNOSIS — R0602 Shortness of breath: Secondary | ICD-10-CM | POA: Insufficient documentation

## 2023-03-23 DIAGNOSIS — R7309 Other abnormal glucose: Secondary | ICD-10-CM | POA: Diagnosis not present

## 2023-03-23 DIAGNOSIS — R059 Cough, unspecified: Secondary | ICD-10-CM | POA: Diagnosis not present

## 2023-03-23 DIAGNOSIS — Z20822 Contact with and (suspected) exposure to covid-19: Secondary | ICD-10-CM | POA: Diagnosis not present

## 2023-03-23 DIAGNOSIS — H6121 Impacted cerumen, right ear: Secondary | ICD-10-CM | POA: Insufficient documentation

## 2023-03-23 DIAGNOSIS — G500 Trigeminal neuralgia: Secondary | ICD-10-CM | POA: Diagnosis not present

## 2023-03-23 DIAGNOSIS — R0981 Nasal congestion: Secondary | ICD-10-CM | POA: Insufficient documentation

## 2023-03-23 LAB — SARS CORONAVIRUS 2 BY RT PCR: SARS Coronavirus 2 by RT PCR: NEGATIVE

## 2023-03-23 LAB — RESP PANEL BY RT-PCR (RSV, FLU A&B, COVID)  RVPGX2
Influenza A by PCR: NEGATIVE
Influenza B by PCR: NEGATIVE
Resp Syncytial Virus by PCR: NEGATIVE
SARS Coronavirus 2 by RT PCR: NEGATIVE

## 2023-03-23 LAB — CBG MONITORING, ED: Glucose-Capillary: 106 mg/dL — ABNORMAL HIGH (ref 70–99)

## 2023-03-23 MED ORDER — KETOROLAC TROMETHAMINE 30 MG/ML IJ SOLN
15.0000 mg | Freq: Once | INTRAMUSCULAR | Status: AC
  Administered 2023-03-23: 15 mg via INTRAMUSCULAR
  Filled 2023-03-23: qty 1

## 2023-03-23 MED ORDER — AMOXICILLIN-POT CLAVULANATE 875-125 MG PO TABS: 1.0000 | ORAL_TABLET | Freq: Two times a day (BID) | ORAL | 0 refills | Status: AC

## 2023-03-23 MED ORDER — GUAIFENESIN ER 600 MG PO TB12: 600.0000 mg | ORAL_TABLET | Freq: Two times a day (BID) | ORAL | 0 refills | Status: AC

## 2023-03-23 MED ORDER — ACETAMINOPHEN 325 MG PO TABS
650.0000 mg | ORAL_TABLET | Freq: Once | ORAL | Status: AC
  Administered 2023-03-23: 650 mg via ORAL
  Filled 2023-03-23: qty 2

## 2023-03-23 NOTE — Discharge Instructions (Signed)
Take the antibiotics as prescribed.  Use Tylenol or Motrin as needed for aches and for fevers.  Follow-up your results later today for results of your flu test.  Your COVID test is negative. Return to the ED with worsening headache, confusion, fever, chest pain, shortness of breath, any other concerns.

## 2023-03-23 NOTE — ED Provider Notes (Signed)
[place] [facility] Provider Note   CSN: [id number] Arrival date & time: 03/23/23  4098     History  Chief Complaint  Patient presents with   Nasal Congestion    Phyllis Calhoun is a 47 y.o. female.  Patient with a history of migraines, trigeminal neuralgia, arthritis presenting with 5-day history of progressively worsening headache as well as nasal congestion and cough.  States headache feels similar to her migraine but is more severe and not improved with her home medications.  Denies thunderclap onset.  States headache is diffuse and radiates to her sinuses.  She is concerned she could have a sinus infection.  Has had nasal congestion and nonproductive cough.  No known fever.  No abdominal pain, nausea, vomiting, chest pain or shortness of breath.  Has had sick contacts at home. Taking over-the-counter flu and sinus remedies without relief.  Headache feels similar to previous migraine but is more severe.  The history is provided by the patient.       Home Medications Prior to Admission medications   Medication Sig Start Date End Date Taking? Authorizing Provider  acetaminophen (TYLENOL) 500 MG tablet Take 1,000 mg by mouth every 6 (six) hours as needed for pain. Pain.    [provider]  amoxicillin-clavulanate (AUGMENTIN) 875-125 MG tablet Take 1 tablet by mouth every 12 (twelve) hours. 08/17/21   Raspet, Noberto Retort, PA-C  diclofenac sodium (VOLTAREN) 1 % GEL APPLY 4 GRAMS 2 TO 3 TIMES DAILY AS NEEDED 10/28/18   [provider]  Fremanezumab-vfrm (AJOVY) 225 MG/1.5ML SOSY Inject 225 mg into the skin every 30 (thirty) days. 11/04/18   Anson Fret, MD  gabapentin (NEURONTIN) 100 MG capsule Take 1 capsule (100 mg total) by mouth 3 (three) times daily. 06/02/19   Lomax, Amy, NP  HYDROcodone-acetaminophen (NORCO) 10-325 MG tablet Take 1 tablet by mouth every 6 (six) hours as needed for moderate pain.    [provider]  HYDROcodone-acetaminophen (NORCO) 10-325 MG tablet Take one tablet by mouth every 6 (six) hours as needed for Pain for up to 30 days. Max Daily Amount: 4 tablets 05/16/22     HYDROcodone-acetaminophen (NORCO) 10-325 MG tablet Take one tablet by mouth every 6 (six) hours as needed for pain for up to 30 days. (max 4 tablets per day) 08/16/22     HYDROcodone-acetaminophen (NORCO) 10-325 MG tablet Take one tablet by mouth every 6 (six) hours as needed for Pain for up to 30 days. DNF 07/17/22 Max Daily Amount: 4 tablets 07/17/22     HYDROcodone-acetaminophen (NORCO) 10-325 MG tablet Take one tablet by mouth every 6 (six) hours as needed for Pain for up to 30 days. Max Daily Amount: 4 tablets 10/16/22     HYDROcodone-acetaminophen (NORCO) 10-325 MG tablet Take 1 tablet by mouth every 6 (six) hours as needed for pain for up to 30 days. Max Daily Amount: 4 tablets 03/11/23     HYDROcodone-acetaminophen (NORCO) 10-325 MG tablet Take 1 tablet by mouth every 6 (six) hours as needed for pain for up to 30 days. Max Daily Amount: 4 tablets 02/09/23     HYDROcodone-acetaminophen (NORCO) 10-325 MG tablet Take one tablet by mouth every 6 (six) hours as needed for Pain for up to 30 days. DNF 02/05/23 Max Daily Amount: 4 tablets 02/05/23     meloxicam (MOBIC) 15 MG tablet Take 1 tablet (15 mg total) by mouth daily. 10/13/21   Franne Forts, DO  meloxicam (MOBIC) 15 MG tablet Take 1 tablet (15 mg total) by mouth daily. 09/13/22     meloxicam (MOBIC) 15 MG tablet Take 1 tablet (15 mg total) by mouth daily. 03/05/23 04/04/23    methocarbamol (ROBAXIN) 750 MG tablet Take 750 mg by mouth every 8 (eight) hours as needed for muscle spasms. 10/16/16   [provider]  ondansetron (ZOFRAN ODT) 4 MG disintegrating tablet Take 1 tablet (4 mg total) by mouth every 8 (eight) hours as needed for nausea or vomiting. 01/18/20   Dahlia Byes A, NP  phentermine (ADIPEX-P) 37.5 MG tablet Take 37.5 mg by mouth daily. 10/25/18   [provider]  Semaglutide-Weight Management (WEGOVY) 0.25 MG/0.5ML SOAJ Inject 0.5 mLs (0.25 mg dose) into the skin once a week for 28 days. 10/16/22     Semaglutide-Weight Management (WEGOVY) 0.5 MG/0.5ML SOAJ Inject 0.5 mg into the skin once a week for 28 days. 10/16/22     SUMAtriptan (IMITREX) 100 MG tablet Take 100 mg by mouth every 2 (two) hours as needed for migraine or headache. May repeat in 2 hours if headache persists or recurs.    [provider]  tiZANidine (ZANAFLEX) 4 MG tablet Take 1 tablet (4 mg total) by mouth every 8 (eight) hours as needed. 06/24/20   Wallis Bamberg, PA-C  topiramate (TOPAMAX) 100 MG tablet Take 1 tablet (100 mg total) by mouth 2 (two) times daily. 01/19/20   Anson Fret, MD  Vitamin D, Ergocalciferol, (DRISDOL) 1.25 MG (50000 UT) CAPS capsule Take 50,000 Units by mouth once a week. 09/14/18   [provider]      Allergies    Patient has no known allergies.    Review of Systems   Review of Systems  Constitutional:  Positive for fatigue. Negative for activity change, appetite change and fever.  HENT:  Positive for congestion, rhinorrhea and sore throat.   Respiratory:  Positive for cough. Negative for chest tightness and shortness of breath.   Cardiovascular:  Negative for chest pain.  Gastrointestinal:  Negative for abdominal pain, nausea and vomiting.  Genitourinary:  Negative for dysuria and hematuria.  Musculoskeletal:  Positive for arthralgias and myalgias.  Skin:  Negative for rash.  Neurological:  Positive for headaches. Negative for dizziness and weakness.   all other systems are negative except as noted in the HPI and PMH.    Physical Exam Updated Vital Signs BP (!) 143/98 (BP Location: Right Arm)   Pulse 87   Temp 98.2 F (36.8 C) (Oral)   Resp 20   Ht 5\' 7"  (1.702 m)   Wt (!) 195 kg   SpO2 100%   BMI 67.35 kg/m  Physical Exam Vitals and nursing note reviewed.  Constitutional:      General: She is not in acute  distress.    Appearance: She is well-developed. She is obese.  HENT:     Head: Normocephalic and atraumatic.     Right Ear: Tympanic membrane normal.     Left Ear: Tympanic membrane normal.     Ears:     Comments: Scattered cerumen on right    Mouth/Throat:     Pharynx: No oropharyngeal exudate.     Comments: No frontal or maxillary sinus tenderness Eyes:     Conjunctiva/sclera: Conjunctivae normal.     Pupils: Pupils are equal, round, and reactive to light.  Neck:     Comments: No meningismus. Cardiovascular:     Rate and Rhythm: Normal rate and regular  rhythm.     Heart sounds: Normal heart sounds. No murmur heard. Pulmonary:     Effort: Pulmonary effort is normal. No respiratory distress.     Breath sounds: Normal breath sounds.  Abdominal:     Palpations: Abdomen is soft.     Tenderness: There is no abdominal tenderness. There is no guarding or rebound.  Musculoskeletal:        General: No tenderness. Normal range of motion.     Cervical back: Normal range of motion and neck supple.  Skin:    General: Skin is warm.  Neurological:     Mental Status: She is alert and oriented to person, place, and time.     Cranial Nerves: No cranial nerve deficit.     Motor: No abnormal muscle tone.     Coordination: Coordination normal.     Comments:  5/5 strength throughout. CN 2-12 intact.Equal grip strength.   Psychiatric:        Behavior: Behavior normal.     ED Results / Procedures / Treatments   Labs (all labs ordered are listed, but only abnormal results are displayed) Labs Reviewed  SARS CORONAVIRUS 2 BY RT PCR  RESP PANEL BY RT-PCR (RSV, FLU A&B, COVID)  [id number]  CBG MONITORING, ED    EKG None  Radiology CT Head Wo Contrast  Addendum Date: 03/23/2023   ADDENDUM REPORT: 03/23/2023 06:34 ADDENDUM: Error in the findings section, the patient's interval craniotomy was on the right. Electronically Signed   By: Tiburcio Pea M.D.   On: 03/23/2023 06:34   Result Date:  03/23/2023 CLINICAL DATA:  Headache, increasing frequency or severity EXAM: CT HEAD WITHOUT CONTRAST TECHNIQUE: Contiguous axial images were obtained from the base of the skull through the vertex without intravenous contrast. RADIATION DOSE REDUCTION: This exam was performed according to the departmental dose-optimization program which includes automated exposure control, adjustment of the mA and/or kV according to patient size and/or use of iterative reconstruction technique. COMPARISON:  09/25/2013 FINDINGS: Brain: No evidence of acute infarction, hemorrhage, hydrocephalus, extra-axial collection or mass lesion/mass effect. Vascular: No hyperdense vessel or unexpected calcification. Skull: Left retromastoid craniotomy since prior. There is chart history of trigeminal neuralgia. Sinuses/Orbits: No acute finding. IMPRESSION: No acute or reversible finding.  No specific cause for headache. Electronically Signed: By: Tiburcio Pea M.D. On: 03/23/2023 05:56   DG Chest 2 View  Result Date: 03/23/2023 CLINICAL DATA:  Shortness of breath and cough EXAM: CHEST - 2 VIEW COMPARISON:  08/22/2021 FINDINGS: Normal heart size and mediastinal contours. Eventration of the right diaphragm. No acute infiltrate or edema. No effusion or pneumothorax. No acute osseous findings. IMPRESSION: No active cardiopulmonary disease. Electronically Signed   By: Tiburcio Pea M.D.   On: 03/23/2023 04:43    Procedures .Ear Cerumen Removal  Date/Time: 03/23/2023 5:57 AM  Performed by: Glynn Octave, MD Authorized by: Glynn Octave, MD   Consent:    Consent obtained:  Verbal   Consent given by:  Patient   Risks, benefits, and alternatives were discussed: yes     Risks discussed:  Bleeding, dizziness, pain, TM perforation, incomplete removal and infection Universal protocol:    Procedure explained and questions answered to patient or proxy's satisfaction: yes     Immediately prior to procedure, a time out was called: yes      Patient identity confirmed:  Verbally with patient Procedure details:    Location:  R ear   Procedure type: irrigation     Procedure outcomes: cerumen  removed   Post-procedure details:    Inspection:  No bleeding and TM intact   Hearing quality:  Improved   Procedure completion:  Tolerated well, no immediate complications     Medications Ordered in ED Medications - No data to display  ED Course/ Medical Decision Making/ A&P                                 Medical Decision Making Amount and/or Complexity of Data Reviewed Radiology: ordered and independent interpretation performed. Decision-making details documented in ED Course. ECG/medicine tests: ordered and independent interpretation performed. Decision-making details documented in ED Course.  Risk OTC drugs. Prescription drug management.   5 days of headache, nasal congestion, cough.  No hypoxia or increased work of breathing.  Nonfocal neurological exam.  No thunderclap onset.  Low suspicion for subarachnoid hemorrhage, meningitis, temporal arteritis  Gradual onset headache.  Nonfocal neurological exam.  No fever.  No meningismus.  COVID test is negative.  Patient given Tylenol and Toradol for her persistent headache.  As headache was persistent, CT scan was obtained.  CT scan negative for hemorrhage.  Does show interval right craniotomy.   Low suspicion for subarachnoid hemorrhage, meningitis, temporal arteritis.  Given her ongoing congestion, facial tenderness and headache will treat for sinusitis with antibiotics and decongestants  Follow-up with her PCP.  Use Tylenol or Motrin as needed for aches and for fevers.  Oral hydration at home.  Return to the ED with worsening headache, confusion, difficulty breathing, chest pain, other concerns.       Final Clinical Impression(s) / ED Diagnoses Final diagnoses:  Sinus headache    Rx / DC Orders ED Discharge Orders     None         Chelesea Weiand, Jeannett Senior,  MD 03/23/23 [phone]

## 2023-03-23 NOTE — ED Triage Notes (Signed)
Nasal congestion x 1 week, HA x 3 days. Pt states +sick contacts with similar sx but unsure what illness. Denies other sx. Ambulatory with cane, but brought to tx room in wheelchair due to distance.

## 2023-03-23 NOTE — ED Notes (Signed)
Pt ambulated to restroom without distress.  

## 2023-03-27 DIAGNOSIS — R7303 Prediabetes: Secondary | ICD-10-CM | POA: Diagnosis not present

## 2023-03-27 DIAGNOSIS — G4733 Obstructive sleep apnea (adult) (pediatric): Secondary | ICD-10-CM | POA: Diagnosis not present

## 2023-03-27 DIAGNOSIS — R569 Unspecified convulsions: Secondary | ICD-10-CM | POA: Diagnosis not present

## 2023-03-27 DIAGNOSIS — Z6841 Body Mass Index (BMI) 40.0 and over, adult: Secondary | ICD-10-CM | POA: Diagnosis not present

## 2023-03-31 DIAGNOSIS — Z6841 Body Mass Index (BMI) 40.0 and over, adult: Secondary | ICD-10-CM | POA: Diagnosis not present

## 2023-04-01 ENCOUNTER — Other Ambulatory Visit: Payer: Self-pay

## 2023-04-02 ENCOUNTER — Other Ambulatory Visit: Payer: Self-pay

## 2023-04-14 ENCOUNTER — Ambulatory Visit (HOSPITAL_BASED_OUTPATIENT_CLINIC_OR_DEPARTMENT_OTHER): Payer: BC Managed Care – PPO | Attending: Family Medicine | Admitting: Physical Therapy

## 2023-04-14 ENCOUNTER — Encounter (HOSPITAL_BASED_OUTPATIENT_CLINIC_OR_DEPARTMENT_OTHER): Payer: Self-pay | Admitting: Physical Therapy

## 2023-04-14 ENCOUNTER — Other Ambulatory Visit: Payer: Self-pay

## 2023-04-14 DIAGNOSIS — M5459 Other low back pain: Secondary | ICD-10-CM | POA: Insufficient documentation

## 2023-04-14 DIAGNOSIS — M25562 Pain in left knee: Secondary | ICD-10-CM | POA: Diagnosis not present

## 2023-04-14 DIAGNOSIS — M25569 Pain in unspecified knee: Secondary | ICD-10-CM | POA: Diagnosis not present

## 2023-04-14 DIAGNOSIS — M25561 Pain in right knee: Secondary | ICD-10-CM | POA: Insufficient documentation

## 2023-04-14 DIAGNOSIS — M6281 Muscle weakness (generalized): Secondary | ICD-10-CM | POA: Diagnosis not present

## 2023-04-14 DIAGNOSIS — G8929 Other chronic pain: Secondary | ICD-10-CM | POA: Insufficient documentation

## 2023-04-14 DIAGNOSIS — M549 Dorsalgia, unspecified: Secondary | ICD-10-CM | POA: Diagnosis not present

## 2023-04-14 NOTE — Therapy (Unsigned)
OUTPATIENT PHYSICAL THERAPY THORACOLUMBAR EVALUATION   Patient Name: Phyllis Calhoun MRN: [id number] DOB:[birth year], 47 y.o., female Today's Date: 04/14/2023  END OF SESSION:   Past Medical History:  Diagnosis Date   Arthritis    both legs   Gallstones    HX OF SEVERAL GALLBLADDER ATTACKS   Headache(784.0)    PT STATES MIGRAINES FROM TRIGEMINAL NEURALGIA   Sleep apnea    sleep study confirmed   Trigeminal neuralgia    2012-PT CONTINUES TO HAVE PAIN RIGHT SIDE OF FACE   Past Surgical History:  Procedure Laterality Date   ABDOMINAL HYSTERECTOMY     BRAIN SURGERY     BREAST REDUCTION SURGERY     CHOLECYSTECTOMY N/A 05/25/2013   Procedure: LAPAROSCOPIC CHOLECYSTECTOMY WITH INTRAOPERATIVE CHOLANGIOGRAM;  Surgeon: Axel Filler, MD;  Location: [facility];  Service: General;  Laterality: N/A;   KNEE ARTHROSCOPY Left 12/29/2015   Procedure: ARTHROSCOPY KNEE;  Surgeon: Jodi Geralds, MD;  Location: [facility] OR;  Service: Orthopedics;  Laterality: Left;   TONSILLECTOMY     Patient Active Problem List   Diagnosis Date Noted   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   Loud snoring 11/05/2018   OSA (obstructive sleep apnea) 11/05/2018   Chest pain 01/20/2016   Trigeminal neuralgia of right side of face 01/20/2016   Shortness of breath 01/20/2016   Acute medial meniscus tear of left knee 12/29/2015   Acute lateral meniscus tear of left knee 12/29/2015   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  ONSET DATE: 2 years ago after surgery, when they damaged the nerve  SUBJECTIVE:                                                                                                                                                                                            SUBJECTIVE STATEMENT: Left knee ablation a couple of months ago and it made my knee worse, weaker for whatever reason.Ablation LB a few weeks later which didn't help either.  Back got worse. Hard to sleep. Pain is not all the time. 3-4 days out of a week sciatic discomfort.  Having gastroc sleeve surgery next week.  PERTINENT HISTORY:  Gastro Sleeve scheduled for Sept 16.  PAIN:  Are you having pain? Yes: NPRS scale: current 7/10; worst 8/10; least 6/10 Pain location: LB across lumbar  area with radiation into left buttock >R  Pain description: ache with radiation into left hip Aggravating factors: walking, lying or sitting ( 30 mins) too long Relieving factors: meds  PRECAUTIONS: None  RED FLAGS: None   WEIGHT BEARING RESTRICTIONS: No  FALLS:  Has patient fallen in last 6 months? No  LIVING ENVIRONMENT: Lives with: lives with their family, lives with their spouse, and mother and father Lives in: House/apartment Stairs: Yes: Internal: 16 steps; on right going up Has following equipment at home: Single point cane and Walker - 2 wheeled  OCCUPATION: disabled  PLOF: Requires assistive device for independence  PATIENT GOALS: feel better mentally and physically  NEXT MD VISIT: 1 week  OBJECTIVE:   DIAGNOSTIC FINDINGS:  None in chart  PATIENT SURVEYS:  FOTO Primary score 26% with goal of 37%    COGNITION: Overall cognitive status: Within functional limits for tasks assessed     SENSATION: WFL  MUSCLE LENGTH:     POSTURE: No Significant postural limitations observed.  Difficult to identify due to body habitus.  PALPATION: Unable to test due to body habotus  LUMBAR ROM:   P!=pain AROM eval  Flexion FT to mid calf P!  Extension 5d  Right lateral flexion 100%  Left lateral flexion 85%  Right rotation   Left rotation    (Blank rows = not tested)  LOWER  EXTREMITY ROM:    WFL.  Some limitations due to body habitus  LOWER EXTREMITY MMT:    MMT Right eval Left eval  Hip flexion 43.9 48.3  Hip extension    Hip abduction 32.4 39.9  Hip adduction    Hip internal rotation    Hip external rotation    Knee flexion    Knee extension 33.7 33.9  Ankle dorsiflexion    Ankle plantarflexion    Ankle inversion    Ankle eversion     (Blank rows = not tested)    FUNCTIONAL TESTS:  Timed up and go (TUG): 21.88 using cane 3 minute walk test: 242ft using rollator  GAIT: Distance walked: 400 ft Assistive device utilized:  rollator Level of assistance: Modified independence Comments: antalig gait R and L; lateral sway, slight circumduction of of hips bilaterally  TODAY'S TREATMENT:                                                                                                                              Eval Objective testing Pt edu    PATIENT EDUCATION:  Education details: Discussed eval findings, rehab rationale, aquatic program progression/POC and pools in area. Patient is in agreement  Person educated: Patient Education method: Chief Technology Officer Education comprehension: verbalized understanding  HOME EXERCISE PROGRAM: Has previous from recent last episode  ASSESSMENT:  CLINICAL IMPRESSION: Patient is a 47 y.o. f who was seen today for physical therapy evaluation and treatment for LBP/dorsalgia.  She is known well to this clinic as she had been dc in May  after episode for OA knees.  She has since undergone and ablation of both areas without reduction of sx, rather verbalizes worsening.  She is scheduled to have bariatric surgery next week.  She will be delayed by at least 1 month before returning to aquatic therapy.  She will benefit from skilled aquatic therapy intervention to improve core strength and lumbosacral mobility, will continue to progress le strengthening and make additions to already established aquatic HEP.  She  does have pool access.  Is considering membership here at [facility] to be able to access therapy pool.  OBJECTIVE IMPAIRMENTS: Abnormal gait, decreased activity tolerance, decreased endurance, decreased mobility, difficulty walking, decreased strength, obesity, and pain.   ACTIVITY LIMITATIONS: carrying, lifting, bending, sitting, standing, squatting, sleeping, stairs, transfers, and locomotion level  PARTICIPATION LIMITATIONS: cleaning, shopping, community activity, and yard work  PERSONAL FACTORS: Fitness, Past/current experiences, Time since onset of injury/illness/exacerbation, and 1 comorbidity: morbid obesity  are also affecting patient's functional outcome.   REHAB POTENTIAL: Good  CLINICAL DECISION MAKING: Evolving/moderate complexity  EVALUATION COMPLEXITY: Moderate   GOALS: Goals reviewed with patient? Yes  SHORT TERM GOALS: Target date: 06/12/20  Pt will tolerate full aquatic sessions consistently without increase in pain and with improving function to demonstrate good toleration and effectiveness of intervention.  Baseline: Goal status: INITIAL  2.  Pt will improve on Tug test to <or=  18s to demonstrate improvement in lower extremity function, mobility and decreased fall risk. Baseline: 21.88 Goal status: INITIAL  3.  Pt will report improvement in sleep waking fewer then 2x nightly due to pain Baseline: multiple Goal status: INITIAL  4.  Pt will report returning to [facility] (Or other pool access) to complete aquatic exercises to demonstrate commitment to manage chronic conditions and lose weight Baseline: stopped going Goal status: INITIAL   LONG TERM GOALS: Target date: 06/11/23  Pt to meet stated Foto Goal of 37% Baseline: 26 Goal status: INITIAL  2.  Pt will be indep with final HEP's (land and aquatic as appropriate) for continued management of condition Baseline:  Goal status: INITIAL  3.  Pt will improve on 3 minute walk test using rollator to >350 to  demonstrate improvement in amb toleration Baseline: 250 ft Goal status: INITIAL  4.  Pt will improve strength in all areas listed by  at least 10 lbs to demonstrate improved overall physical function  Baseline: See chart Goal status: INITIAL  5.  Pt will report decrease in daily pain to 4-5/10 or less to demonstrate improvement inmanagement of chronic condition. Baseline: 7/10 Goal status: INITIAL     PLAN:  PT FREQUENCY: 1x/week  PT DURATION: other: 12 weeks Will be delay of 6 weeks to initiate due to pending bariatric surgery scheduled 04/21/23  PLANNED INTERVENTIONS: Therapeutic exercises, Therapeutic activity, Neuromuscular re-education, Balance training, Gait training, Patient/Family education, Self Care, Joint mobilization, Joint manipulation, Stair training, Orthotic/Fit training, DME instructions, Aquatic Therapy, Dry Needling, Cryotherapy, Moist heat, Taping, Manual therapy, and Re-evaluation.  PLAN FOR NEXT SESSION: Aquatics: Core and LE strengthening. Stretching/ROM lumbosacral area. Progress and add to HEP for pt to complete indep at [facility], PT 04/14/2023, 7:00 AM

## 2023-04-21 DIAGNOSIS — J45909 Unspecified asthma, uncomplicated: Secondary | ICD-10-CM | POA: Diagnosis not present

## 2023-04-21 DIAGNOSIS — Z6841 Body Mass Index (BMI) 40.0 and over, adult: Secondary | ICD-10-CM | POA: Diagnosis not present

## 2023-04-21 DIAGNOSIS — R569 Unspecified convulsions: Secondary | ICD-10-CM | POA: Diagnosis not present

## 2023-04-21 DIAGNOSIS — G8918 Other acute postprocedural pain: Secondary | ICD-10-CM | POA: Diagnosis not present

## 2023-04-21 DIAGNOSIS — G500 Trigeminal neuralgia: Secondary | ICD-10-CM | POA: Diagnosis not present

## 2023-04-21 DIAGNOSIS — G4733 Obstructive sleep apnea (adult) (pediatric): Secondary | ICD-10-CM | POA: Diagnosis not present

## 2023-04-21 DIAGNOSIS — K219 Gastro-esophageal reflux disease without esophagitis: Secondary | ICD-10-CM | POA: Diagnosis not present

## 2023-04-21 DIAGNOSIS — Z79899 Other long term (current) drug therapy: Secondary | ICD-10-CM | POA: Diagnosis not present

## 2023-04-21 DIAGNOSIS — M47819 Spondylosis without myelopathy or radiculopathy, site unspecified: Secondary | ICD-10-CM | POA: Diagnosis not present

## 2023-04-21 DIAGNOSIS — M170 Bilateral primary osteoarthritis of knee: Secondary | ICD-10-CM | POA: Diagnosis not present

## 2023-04-21 DIAGNOSIS — R7303 Prediabetes: Secondary | ICD-10-CM | POA: Diagnosis not present

## 2023-04-21 DIAGNOSIS — Z7982 Long term (current) use of aspirin: Secondary | ICD-10-CM | POA: Diagnosis not present

## 2023-04-28 ENCOUNTER — Other Ambulatory Visit: Payer: Self-pay

## 2023-04-28 DIAGNOSIS — M170 Bilateral primary osteoarthritis of knee: Secondary | ICD-10-CM | POA: Diagnosis not present

## 2023-04-28 DIAGNOSIS — F1120 Opioid dependence, uncomplicated: Secondary | ICD-10-CM | POA: Diagnosis not present

## 2023-04-28 MED ORDER — HYDROCODONE-ACETAMINOPHEN 10-325 MG PO TABS: ORAL_TABLET | ORAL | 0 refills | Status: DC

## 2023-04-28 MED ORDER — HYDROCODONE-ACETAMINOPHEN 10-325 MG PO TABS
ORAL_TABLET | ORAL | 0 refills | Status: AC
Start: 2023-05-01 — End: ?
  Filled 2023-05-02: qty 30, 8d supply, fill #0

## 2023-04-28 MED ORDER — MELOXICAM 15 MG PO TABS
15.0000 mg | ORAL_TABLET | Freq: Every day | ORAL | 2 refills | Status: AC
  Filled 2023-04-28: qty 30, 30d supply, fill #0
  Filled 2023-05-31 – 2023-06-02 (×2): qty 30, 30d supply, fill #1
  Filled 2023-07-01: qty 30, 30d supply, fill #2

## 2023-05-02 ENCOUNTER — Other Ambulatory Visit: Payer: Self-pay

## 2023-05-02 MED ORDER — HYDROCODONE-ACETAMINOPHEN 10-325 MG PO TABS
1.0000 | ORAL_TABLET | Freq: Four times a day (QID) | ORAL | 0 refills | Status: AC | PRN
  Filled 2023-05-31 – 2023-06-05 (×2): qty 120, 30d supply, fill #0

## 2023-05-02 MED ORDER — HYDROCODONE-ACETAMINOPHEN 10-325 MG PO TABS
1.0000 | ORAL_TABLET | Freq: Four times a day (QID) | ORAL | 0 refills | Status: AC | PRN
Start: 2023-05-02 — End: ?
  Filled 2023-05-09: qty 90, 23d supply, fill #0

## 2023-05-02 MED ORDER — HYDROCODONE-ACETAMINOPHEN 10-325 MG PO TABS
1.0000 | ORAL_TABLET | Freq: Four times a day (QID) | ORAL | 0 refills | Status: AC | PRN
Start: 2023-06-30 — End: ?
  Filled 2023-07-01 – 2023-07-04 (×3): qty 120, 30d supply, fill #0

## 2023-05-09 ENCOUNTER — Other Ambulatory Visit (HOSPITAL_COMMUNITY): Payer: Self-pay

## 2023-05-09 ENCOUNTER — Other Ambulatory Visit: Payer: Self-pay

## 2023-05-13 ENCOUNTER — Ambulatory Visit: Payer: BC Managed Care – PPO | Attending: Family Medicine | Admitting: Physical Therapy

## 2023-05-13 DIAGNOSIS — G8929 Other chronic pain: Secondary | ICD-10-CM | POA: Diagnosis not present

## 2023-05-13 DIAGNOSIS — M5459 Other low back pain: Secondary | ICD-10-CM | POA: Insufficient documentation

## 2023-05-13 DIAGNOSIS — M6281 Muscle weakness (generalized): Secondary | ICD-10-CM | POA: Diagnosis not present

## 2023-05-13 DIAGNOSIS — R262 Difficulty in walking, not elsewhere classified: Secondary | ICD-10-CM | POA: Insufficient documentation

## 2023-05-13 DIAGNOSIS — M25561 Pain in right knee: Secondary | ICD-10-CM | POA: Insufficient documentation

## 2023-05-13 DIAGNOSIS — M25562 Pain in left knee: Secondary | ICD-10-CM | POA: Diagnosis not present

## 2023-05-13 NOTE — Therapy (Signed)
OUTPATIENT PHYSICAL THERAPY WHEELCHAIR EVALUATION   Patient Name: Phyllis Calhoun MRN: [id number] DOB:19-Jul-1976, 47 y.o., female Today's Date: 05/13/2023  END OF SESSION:  PT End of Session - 05/13/23 1522     Visit Number 1   w/c eval   Number of Visits 1   w/c eval   Date for PT Re-Evaluation 05/13/23    Authorization Type [employer]    PT Start Time 1522    PT Stop Time 1547   w/c eval   PT Time Calculation (min) 25 min    Activity Tolerance Patient tolerated treatment well    Behavior During Therapy WFL for tasks assessed/performed             Past Medical History:  Diagnosis Date   Arthritis    both legs   Gallstones    HX OF SEVERAL GALLBLADDER ATTACKS   Headache(784.0)    PT STATES MIGRAINES FROM TRIGEMINAL NEURALGIA   Sleep apnea    sleep study confirmed   Trigeminal neuralgia    2012-PT CONTINUES TO HAVE PAIN RIGHT SIDE OF FACE   Past Surgical History:  Procedure Laterality Date   ABDOMINAL HYSTERECTOMY     BRAIN SURGERY     BREAST REDUCTION SURGERY     CHOLECYSTECTOMY N/A 05/25/2013   Procedure: LAPAROSCOPIC CHOLECYSTECTOMY WITH INTRAOPERATIVE CHOLANGIOGRAM;  Surgeon: Axel Filler, MD;  Location: [facility];  Service: General;  Laterality: N/A;   KNEE ARTHROSCOPY Left 12/29/2015   Procedure: ARTHROSCOPY KNEE;  Surgeon: Jodi Geralds, MD;  Location: [facility];  Service: Orthopedics;  Laterality: Left;   TONSILLECTOMY     Patient Active Problem List   Diagnosis Date Noted   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   Loud snoring 11/05/2018   OSA (obstructive sleep apnea) 11/05/2018   Chest pain 01/20/2016   Trigeminal neuralgia of right side of face 01/20/2016   Shortness of breath 01/20/2016   Acute medial meniscus tear of left knee 12/29/2015   Acute lateral meniscus tear of left knee 12/29/2015   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   Super obesity 12/29/2015    PCP: Farris Has, MD  REFERRING PROVIDER: Farris Has, MD  THERAPY DIAG:  Muscle weakness (generalized)  Chronic pain of both knees  Other low back pain  Difficulty in walking, not elsewhere classified  Rationale for Evaluation and Treatment Habilitation  SUBJECTIVE:                                                                                                                                                                                           SUBJECTIVE STATEMENT: Pt presents for wheelchair  evaluation. ***   Interested in power wheelchair  Pt reports no falls and no near falls   Pain trying to stand for extended periods of time Fatigues easily, husband had to help her with dressing and bathing Husband helps with cooking, cleaning  Uses rollator in and outside the home  Can walk about 50 ft before onset of pain, has to bend over        PRECAUTIONS: Fall  RED FLAGS: {PT Red Flags:29287}  WEIGHT BEARING RESTRICTIONS No    OCCUPATION: disability  PLOF:  {PLOF:24004}  PATIENT GOALS: ***         MEDICAL HISTORY:  Primary diagnosis onset: ***     Medical Diagnosis with ICD-10 code: ***   [] Progressive disease  Relevant future surgeries:     Height:  Weight:  Explain recent changes or trends in weight:      History:  Past Medical History:  Diagnosis Date   Arthritis    both legs   Gallstones    HX OF SEVERAL GALLBLADDER ATTACKS   Headache(784.0)    PT STATES MIGRAINES FROM TRIGEMINAL NEURALGIA   Sleep apnea    sleep study confirmed   Trigeminal neuralgia    2012-PT CONTINUES TO HAVE PAIN RIGHT SIDE OF FACE       Cardio Status:  Functional Limitations:   [] Intact  []  Impaired      Respiratory Status:  Functional Limitations:   [] Intact  [] Impaired   [] SOB [] COPD [] O2 Dependent ______LPM  [] Ventilator Dependent  Resp equip:                                                     Objective Measure(s):   Orthotics:   [] Amputee:                                                              [] Prosthesis:        HOME ENVIRONMENT:  [] House [] Condo/town home [x] Apartment [] Asst living [] LTCF         [] Own  [x] Rent   [] Lives alone [x] Lives with others -     husband                        Hours without assistance:   [x] Home is accessible to patient                1st floor apartment                 Storage of wheelchair:  [] In home   [] Other Comments:        COMMUNITY :  TRANSPORTATION:  [] Car [] Child psychotherapist [] Adapted w/c Lift []  Ambulance [] Other:                     [] Sits in wheelchair during transport   Where is w/c stored during transport?  [] [employer]  []  [employer]  r   [] Self-Driver       Drive while in  Biomedical scientist [] yes [] no   Employment and/or school:  Specific requirements pertaining to mobility  Other:  COMMUNICATION:  Verbal Communication  [] WFL [] receptive [] WFL [] expressive [] Understandable  [] Difficult to understand  [] non-communicative  Primary Language:______________ 2nd:_____________  Communication provided by:[] Patient [] Family [] Caregiver [] Translator   [] Uses an augmentative communication device     Manufacturer/Model :                                                                MOBILITY/BALANCE:  Sitting Balance  Standing Balance  Transfers  Ambulation   [] WFL      [] WFL  [] Independent  []  Independent   [] Uses UE for balance in sitting Comments:  [] Uses UE/device for stability Comments:  []  Min assist  []  Ambulates independently with       device:___________________      []  Mod assist  []  Able to ambulate ______ feet        safely/functionally/independently   []  Min assist  []  Min assist  []  Max assist  []  Non-functional ambulator         History/High risk of falls   []  Mod assist  []  Mod assist  []  Dependent  []  Unable to ambulate   []  Max  assist  []  Max assist  Transfer method:[] 1 person [] 2 person [] sliding board [] squat pivot [] stand pivot [] mechanical patient lift  [] other:   []  Unable  []  Unable    Fall  History: # of falls in the past 6 months? *** # of "near" falls in the past 6 months? ***    CURRENT SEATING / MOBILITY:  Current Mobility Device: [] None [] Cane/Walker [] Manual [] Dependent [] Dependent w/ Tilt rScooter  [] Power (type of control):   Manufacturer:  Model:  Serial #:   Size:  Color:  Age:   Purchased by whom:   Current condition of mobility base:    Current seating system:                                                                       Age of seating system:    Describe posture in present seating system:    Is the current mobility meeting medical necessity?:  [] Yes [] No Describe:                                     Ability to complete Mobility-Related Activities of Daily Living (MRADL's) with Current Mobility Device:   Move room to room  [] Independent  [] Min [] Mod [] Max assist  [] Unable  Comments:   Meal prep  [] Independent  [] Min [] Mod [] Max assist  [] Unable    Feeding  [] Independent  [] Min [] Mod [] Max assist  [] Unable    Bathing  [] Independent  [] Min [] Mod [] Max assist  [] Unable    Grooming  [] Independent  [] Min [] Mod [] Max assist  [] Unable    UE dressing  [] Independent  [] Min [] Mod [] Max assist  [] Unable    LE dressing  [] Independent   [] Min [] Mod [] Max assist  [] Unable    Toileting  [] Independent  [] Min [] Mod [] Max  assist  [] Unable    Bowel Mgt: []  Continent []  Incontinent []  Accidents []  Diapers []  Colostomy []  Bowel Program:  Bladder Mgt: []  Continent []  Incontinent []  Accidents []  Diapers []  Urinal []  Intermittent Cath []  Indwelling Cath []  Supra-pubic Cath     Current Mobility Equipment Trialed/ Ruled Out:    Does not meet mobility needs due to:    Loraine Leriche all boxes that indicate inability to use the specific equipment listed     Meets needs for safe  independent functional  ambulation  / mobility    Risk of  Falling or History of Falls    Enviromental limitations      Cognition    Safety concerns with  physical ability    Decreased / limitations  endurance  & strength     Decreased / limitations  motor skills  & coordination    Pain    Pace /  Speed    Cardiac and/or  respiratory condition    Contra - indicated by diagnosis   Cane/Crutches  []   []   []   []   []   []   []   []   []   []   []    Walker / Rollator  []  NA   []   []   []   []   []   []   []   []   []   []   []     Manual Wheelchair [id number]:  []  NA  []   []   []   []   []   []   []   []   []   []   []    Manual W/C (K0005) with power assist  []  NA  []   []   []   []   []   []   []   []   []   []   []    Scooter  []  NA  []   []   []   []   []   []   []   []   []   []   []    Power Wheelchair: standard joystick  []  NA  []   []   []   []   []   []   []   []   []   []   []    Power Wheelchair: alternative controls  []  NA  []   []   []   []   []   []   []   []   []   []   []    Summary:  The least costly alternative for independent functional mobility was found to be:    []  Crutch/Cane  []  Walker []  Manual w/c  []  Manual w/c with power assist   []  Scooter   []  Power w/c std joystick   []  Power w/c alternative control        []  Requires dependent care mobility device   Cabin crew for [employer] skills are adequate for safe mobility equipment operation  []   Yes []   No  Patient is willing and motivated to use recommended mobility equipment  []   Yes []   No       []  Patient is unable to safely operate mobility equipment independently and requires dependent care equipment Comments:           SENSATION and SKIN ISSUES:  Sensation []  Intact  []  Impaired []  Absent []  Hyposensate []  Hypersensate  []  Defensiveness  Location(s) of impairment: sciatic nerve pain on L side with numbness on lateral portion of her leg, also bothers her on the R side sometimes; sometimes; increased sensitivity in feet   Pressure Relief Method(s):  []  Lean side to side to offload (without risk of falling)  []   W/C push up (  4+ times/hour for 15+ seconds) []  Stand up (without risk of falling)    []  Other: (Describe): Effective  pressure relief method(s) above can be performed consistently throughout the day: [] Yes  []  No If not, Why?:  Skin Integrity Risk:       [x]  Low risk           []  Moderate risk            []  High risk  If high risk, explain:   Skin Issues/Skin Integrity  Current skin Issues  []  Yes [x]  No []  Intact  []   Red area   []   Open area  []  Scar tissue  []  At risk from prolonged sitting  Where: History of Skin Issues  []  Yes []  No Where : When: Stage: Hx of skin flap surgeries  []  Yes []  No Where:  When:  Pain: [x]  Yes []  No   Pain Location(s): lower back (lumbar); 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 -  makes it hard to stand for long periods of time        MAT EVALUATION:  Neuro-Muscular Status: (Tone, Reflexive, Responses, etc.)     []   Intact   []  Spasticity:  []  Hypotonicity  []  Fluctuating  []  Muscle Spasms  []  Poor Righting Reactions/Poor Equilibrium Reactions  []  Primal Reflex(s):    Comments:            COMMENTS:    POSTURE:     Comments:  Pelvis Anterior/Posterior:  []  Neutral   [x]  Posterior  []  Anterior  []  Fixed - No movement []  Tendency away from neutral []  Flexible []  Self-correction []  External correction Obliquity (viewed from front)  [x]  WFL []  R Obliquity []  L Obliquity  []  Fixed - No movement []  Tendency away from neutral []  Flexible []  Self-correction []  External correction Rotation  [x]  WFL []  R anterior []  L anterior  []  Fixed - No movement []  Tendency away from neutral []  Flexible []  Self-correction []  External correction Tonal Influence Pelvis:  [x]  Normal []  Flaccid []  Low tone []  Spasticity []  Dystonia []  Pelvis thrust []  Other:    Trunk Anterior/Posterior:  [x]   WFL []  Thoracic kyphosis []  Lumbar lordosis  []  Fixed - No movement []  Tendency away from neutral []  Flexible []  Self-correction []  External correction  [x]  WFL []  Convex to left  []  Convex to right []  S-curve   []  C-curve []  Multiple curves []  Tendency away from neutral []  Flexible []  Self-correction []  External correction Rotation of shoulders and upper trunk:  [x]  Neutral []  Left-anterior []  Right- anterior []  Fixed- no movement []  Tendency away from neutral []  Flexible []  Self correction []  External correction Tonal influence Trunk:  [x]  Normal []  Flaccid []  Low tone []  Spasticity []  Dystonia []  Other:   Head & Neck  [x]  Functional []  Flexed    []  Extended []  Rotated right  []  Rotated left []  Laterally flexed right []  Laterally flexed left []  Cervical hyperextension   [x]  Good head control []  Adequate head control []  Limited head control []  Absent head control Describe tone/movement of head and neck: [facility]     Lower Extremity Measurements: LE ROM:  {AROM/PROM:27142} ROM Right 05/13/2023 Left 05/13/2023  Hip flexion    Hip extension    Hip abduction    Hip adduction    Knee flexion    Knee extension    Ankle dorsiflexion    Ankle plantarflexion     (Blank rows = not tested)  LE MMT:  MMT Right 05/13/2023 Left 05/13/2023  Hip flexion 3 3  Hip extension    Hip abduction    Hip adduction    Knee flexion 3 2-  Knee extension 4 4  Ankle dorsiflexion 4 4  Ankle plantarflexion     (Blank rows = not tested)  Hip positions:  []  Neutral   [x]  Abducted   []  Adducted  []  Subluxed   []  Dislocated   []  Fixed   []  Tendency away from neutral []  Flexible []  Self-correction []  External correction   Hip Windswept:[x]  Neutral  []  Right    []  Left  []  Subluxed   []  Dislocated   []  Fixed   []  Tendency away from neutral []  Flexible []  Self-correction []  External correction  LE Tone: [x]  Normal []  Low tone []  Spasticity []  Flaccid []   Dystonia []  Rocks/Extends at hip []  Thrust into knee extension []  Pushes legs downward into footrest  Foot positioning: ROM Concerns: Dorsiflexed: []  Right   []  Left Plantar flexed: []  Right    []  Left Inversion: []  Right    []  Left Eversion: []  Right    []  Left  LE Edema: []  1+ (Barely detectable impression when finger is pressed into skin) []  2+ (slight indentation. 15 seconds to rebound) []  3+ (deeper indentation. 30 seconds to rebound) []  4+ (>30 seconds to rebound)  UE Measurements:  UPPER EXTREMITY ROM:   {AROM/PROM:27142} ROM Right 05/13/2023 Left 05/13/2023  Shoulder flexion    Shoulder abduction    Shoulder adduction    Elbow flexion    Elbow extension    Wrist flexion    Wrist extension    (Blank rows = not tested)  UPPER EXTREMITY MMT:  MMT Right 05/13/2023 Left 05/13/2023  Shoulder flexion Not assessed due to lifting restriction***   Shoulder abduction    Shoulder adduction    Elbow flexion    Elbow extension    Wrist flexion    Wrist extension    Pinch strength    Grip strength WFL WFL  (Blank rows = not tested)  Shoulder Posture:  Right Tendency towards Left  []   Functional []    []   Elevation []    [x]   Depression [x]    [x]   Protraction [x]    []   Retraction []    []   Internal rotation []    []   External rotation []    []   Subluxed []     UE Tone: []  Normal []  Flaccid []  Low tone []  Spasticity  []  Dystonia []  Other:   UE Edema: []  1+ (Barely detectable impression when finger is pressed into skin) []  2+ (slight indentation. 15 seconds to rebound) []  3+ (deeper indentation. 30 seconds to rebound) []  4+ (>30 seconds to rebound)  Wrist/Hand: Handedness: [x]  Right   []  Left   []  NA: Comments:  Right  Left  []   WNL []    []   Limitations []    []   Contractures []    []   Fisting []    []   Tremors []    []   Weak grasp []    []   Poor dexterity []    []   Hand movement non functional []    []   Paralysis []   [facility] PT Assessment - 05/13/23  1540       Ambulation/Gait   Gait velocity 32.8 ft over 23.43 sec = 1.4 ft/sec      Standardized Balance Assessment   Standardized Balance Assessment Timed Up and Go Test      Timed Up and Go Test   TUG Normal TUG    Normal TUG (seconds) 25.6   with rollator             MOBILITY BASE RECOMMENDATIONS and JUSTIFICATION:  MOBILITY BASE  JUSTIFICATION   Manufacturer:   Games developer:     elite heavy duty                         Color: red Seat Width:  20" Seat Depth:  20"   []  Manual mobility base (continue below)   []  Scooter/POV  [x]  Power mobility base   Number of hours per day spent in above selected mobility base: ***  Typical daily mobility base use Schedule: ***   []  is not a safe, functional ambulator  []  limitation prevents from completing a MRADL(s) within a reasonable time frame    []  limitation places at high risk of morbidity or mortality secondary to  the attempts to perform a    MRADL(s)  []  limitation prevents accomplishing a MRADL(s) entirely  []  provide independent mobility  []  equipment is a lifetime medical need  []  walker or cane inadequate  []  any type manual wheelchair      inadequate  []  scooter/POV inadequate      []  requires dependent mobility          POWER MOBILITY      []  Scooter/POV    []  can safely operate   []  can safely transfer   []  has adequate trunk stability   []  cannot functionally propel  manual wheelchair    [x]  Power mobility base    []  non-ambulatory   []  cannot functionally propel manual wheelchair   []  cannot functionally and safely      operate scooter/POV  []  can safely operate power       wheelchair  []  home is accessible  []  willing to use power wheelchair     Tilt  []  Powered tilt on powered chair  []  Powered tilt on manual chair  []  Manual tilt on manual chair Comments:  []  change position for pressure      []  elief/cannot weight shift   []  change position against      gravitational force on head and       shoulders   []  decrease pain  []  blood pressure management   []  control autonomic dysreflexia  []  decrease respiratory distress  []  management of spasticity  []  management of low tone  []  facilitate postural control   []  rest periods   []  control edema  []  increase sitting tolerance   []  aid with transfers     Recline   []  Power recline on power chair  []  Manual recline on manual chair  Comments:    []  intermittent catheterization  []  manage spasticity  []  accommodate femur to back angle  []  change position for pressure relief/cannot weight shift rhigh risk of pressure sore development  []  tilt alone does not accomplish     effective pressure relief, maximum pressure relief achieved at -      _______ degrees tilt   _______ degrees recline   []  difficult to transfer  to and from bed []  rest periods and sleeping in chair  []  repositioning for transfers  []  bring to full recline for ADL care  []  clothing/diaper changes in chair  []  gravity PEG tube feeding  []  head positioning  []  decrease pain  []  blood pressure management   []  control autonomic dysreflexia  []  decrease respiratory distress  []  user on ventilator     Elevator on mobility base  []  Power wheelchair  []  Scooter  []  increase Indep in transfers   []  increase Indep in ADLs    []  bathroom function and safety  []  kitchen/cooking function and safety  []  shopping  []  raise height for communication at standing level  []  raise height for eye contact which reduces cervical neck strain and pain  []  drive at raised height for safety and navigating crowds  []  Other:   []  Vertical position system  (anterior tilt)     (Drive locks-out)    []  Stand       (Drive enabled)  []  independent weight bearing  []  decrease joint contractures  []  decrease/manage spasticity  []  decrease/manage spasms  []  pressure distribution away from   scapula, sacrum, coccyx, and ischial tuberosity  []  increase digestion and elimination   []   access to counters and cabinets  []  increase reach  []  increase interaction with others at eye level, reduces neck strain  []  increase performance of       MRADL(s)      Power elevating legrest    []  Center mount (Single) 85-170 degrees       []  Standard (Pair) 100-170 degrees  []  position legs at 90 degrees, not available with std power ELR  []  center mount tucks into chair to decrease turning radius in home, not available with std power ELR  []  provide change in position for LE  []  elevate legs during recline    []  maintain placement of feet on      footplate  []  decrease edema  []  improve circulation  []  actuator needed to elevate legrest  []  actuator needed to articulate legrest preventing knees from flexing  []  Increase ground clearance over      curbs  []   STD (pair) independently                     elevate legrest   POWER WHEELCHAIR CONTROLS      Controls/input device  []  Expandable  [x]  Non-expandable  []  Proportional  [x]  Right Hand []  Left Hand  []  Non-proportional/switches/head-array  []  Electrical/proximity         []   Mechanical      Manufacturer:___________________   Type:______joystick__________________ []  provides access for controlling wheelchair  []  programming for accurate control  []  progressive disease/changing condition  []  required for alternative drive      controls       []  lacks motor control to operate  proportional drive control  []  unable to understand proportional controls  []  limited movement/strength  []  extraneous movement / tremors / ataxic / spastic       []  Upgraded electronics controller/harness    []  Single power (tilt or recline)   []  Expandable    []  Non-expandable plus   []  Multi-power (tilt, recline, power legrest, power seat lift, vertical positioning system, stand)  []  allows input device to communicate with drive motors  []  harness provides necessary connections between the controller, input device, and seat functions      []   needed in order to operate power seat functions through joystick/ input device  []  required for alternative drive controls     []  Enhanced display  []  required to connect all alternative drive controls   []  required for upgraded joystick      (lite-throw, heavy duty, micro)  []  Allows user to see in which mode and drive the wheelchair is set; necessary for alternate controls       []  Upgraded tracking electronics  []  correct tracking when on uneven surfaces makes switch driving more efficient and less fatiguing  []  increase safety when driving  []  increase ability to traverse thresholds    []  Safety / reset / mode switches     Type:    []  Used to change modes and stop the wheelchair when driving     [x]  Mount for joystick / input device/switches  [x]  swing away for access or transfers   []  attaches joystick / input device / switches to wheelchair   []  provides for consistent access  []  midline for optimal placement    []  Attendant controlled joystick plus     mount  []  safety  []  long distance driving  []  operation of seat functions  []  compliance with transportation regulations    [x]  Battery  [x]  required to power (power assist / scooter/ power wc / other):   []  Power inverter (24V to 12V)  []  required for ventilator / respiratory equipment / other:     CHAIR OPTIONS MANUAL & POWER      Armrests   [x]  adjustable height []  removable  []  swing away []  fixed  []  flip back  []  reclining  [x]  full length pads []  desk []  tube arms []  gel pads  []  provide support with elbow at 90    []  remove/flip back/swing away for  transfers  []  provide support and positioning of upper body    []  allow to come closer to table top  []  remove for access to tables  []  provide support for w/c tray  []  change of height/angles for variable activities   []  Elbow support / Elbow stop  []  keep elbow positioned on arm pad  []  keep arms from falling off arm pad  during tilt and/or recline   Upper Extremity  Support  []  Arm trough  []   R  []   L  Style:  []  swivel mount []  fixed mount   []  posterior hand support  []   tray  []  full tray  []  joystick cut out  []   R  []   L  Style:  []  decrease gravitational pull on      shoulders  []  provide support to increase UE  function  []  provide hand support in natural    position  []  position flaccid UE  []  decrease subluxation    []  decrease edema       []  manage spasticity   []  provide midline positioning  []  provide work surface  []  placement for AAC/ Computer/ EADL       Hangers/ Legrests   []  ______ degree  []  Elevating []  articulating  []  swing away []  fixed []  lift off  []  heavy duty  []  adjustable knee angle  []  adjustable calf panel   []  longer extension tube              []  provide LE support  []  maintain placement of feet on      footplate   []  accommodate  lower leg length  []  accommodate to hamstring       tightness  []  enable transfers  []  provide change in position for LE's  []  elevate legs during recline    []  decrease edema  []  durability      Foot support   [x]  footplate []  R []  L [x]  flip up           [x]  Depth adjustable   [x]  angle adjustable  []  foot board/one piece    []  provide foot support  []  accommodate to ankle ROM  []  allow foot to go under wheelchair base  []  enable transfers     []  Shoe holders  []  position foot    []  decrease / manage spasticity  []  control position of LE  []  stability    []  safety     []  Ankle strap/heel      loops  []  support foot on foot support  []  decrease extraneous movement  []  provide input to heel   []  protect foot     []  Amputee adapter []  R  []  L     Style:                  Size:  []  Provide support for stump/residual extremity    []  Transportation tie-down  []  to provide crash tested tie-down brackets    []  Crutch/cane holder    []  O2 holder    []  IV hanger   []  Ventilator tray/mount    []  stabilize accessory on wheelchair       Component  Justification     []   Seat cushion      []  accommodate impaired sensation  []  decubitus ulcers present or history  []  unable to shift weight  []  increase pressure distribution  []  prevent pelvic extension  []  custom required "off-the-shelf"    seat cushion will not accommodate deformity  []  stabilize/promote pelvis alignment  []  stabilize/promote femur alignment  []  accommodate obliquity  []  accommodate multiple deformity  []  incontinent/accidents  []  low maintenance     []  seat mounts                 []  fixed []  removable  []  attach seat platform/cushion to wheelchair frame    []  Seat wedge    []  provide increased aggressiveness of seat shape to decrease sliding  down in the seat  []  accommodate ROM        []  Cover replacement   []  protect back or seat cushion  []  incontinent/accidents    []  Solid seat / insert    []  support cushion to prevent      hammocking  []  allows attachment of cushion to mobility base    []  Lateral pelvic/thigh/hip     support (Guides)     []  decrease abduction  []  accommodate pelvis  []  position upper legs  []  accommodate spasticity  []  removable for transfers     []  Lateral pelvic/thigh      supports mounts  []  fixed   []  swing-away   []  removable  []  mounts lateral pelvic/thigh supports     []  mounts lateral pelvic/thigh supports swing-away or removable for transfers    []  Medial thigh support (Pommel)  [] decrease adduction  [] accommodate ROM  []  remove for transfers   []  alignment      []  Medial thigh   []  fixed      support mounts      []  swing-away   []   removable  []  mounts medial thigh supports   []  Mounts medial supports swing- away or removable for transfers       Component  Justification   []  Back       []  provide posterior trunk support []  facilitate tone  []  provide lumbar/sacral support []  accommodate deformity  []  support trunk in midline   []  custom required "off-the-shelf" back support will not accommodate deformity   []  provide lateral trunk  support []  accommodate or decrease tone            []  Back mounts  []  fixed  []  removable  []  attach back rest/cushion to wheelchair frame   []  Lateral trunk      supports  []  R []  L  []  decrease lateral trunk leaning  []  accommodate asymmetry    []  contour for increased contact  []  safety    []  control of tone    []  Lateral trunk      supports mounts  []  fixed  []  swing-away   []  removable  []  mounts lateral trunk supports     []  Mounts lateral trunk supports swing-away or removable for transfers   []  Anterior chest      strap, vest     []  decrease forward movement of shoulder  []  decrease forward movement of trunk  []  safety/stability  []  added abdominal support  []  trunk alignment  []  assistance with shoulder control   []  decrease shoulder elevation    []  Headrest      []  provide posterior head support  []  provide posterior neck support  []  provide lateral head support  []  provide anterior head support  []  support during tilt and recline  []  improve feeding     []  improve respiration  []  placement of switches  []  safety    []  accommodate ROM   []  accommodate tone  []  improve visual orientation   []  Headrest           []  fixed []  removable []  flip down      Mounting hardware   []  swing-away laterals/switches  []  mount headrest   []  mounts headrest flip down or  removable for transfers  []  mount headrest swing-away laterals   []  mount switches     []  Neck Support    []  decrease neck rotation  []  decrease forward neck flexion   Pelvic Positioner    [x]  std hip belt          []  padded hip belt  []  dual pull hip belt  []  four point hip belt  []  stabilize tone  []  decrease falling out of chair  []  prevent excessive extension  []  special pull angle to control      rotation  []  pad for protection over boney   prominence  []  promote comfort    []  Essential needs        bag/pouch   []  medicines []  special food rorthotics []  clothing changes  []  diapers  []   catheter/hygiene []  ostomy supplies   The above equipment has a life- long use expectancy.  Growth and changes in medical and/or functional conditions would be the exceptions.   SUMMARY:  Why mobility device was selected; include why a lower level device is not appropriate: ***  TUG and gait speed***  ASSESSMENT:  CLINICAL IMPRESSION: Patient is a *** y.o. *** who was seen today for physical therapy evaluation and treatment for ***.    OBJECTIVE IMPAIRMENTS {opptimpairments:25111}.  ACTIVITY LIMITATIONS {activitylimitations:27494}  PARTICIPATION LIMITATIONS: {participationrestrictions:25113}  CLINICAL DECISION MAKING: {clinical decision making:25114}  EVALUATION COMPLEXITY: {Evaluation complexity:25115}                                   GOALS: One time visit. No goals established.    PLAN: PT FREQUENCY: one time visit    Peter Congo, PT Peter Congo, PT, DPT, CSRS  05/13/2023, 3:50 PM    I concur with the above findings and recommendations of the therapist:  Physician name printed:         Physician's signature:      Date:

## 2023-05-20 DIAGNOSIS — R7303 Prediabetes: Secondary | ICD-10-CM | POA: Diagnosis not present

## 2023-05-20 DIAGNOSIS — M25562 Pain in left knee: Secondary | ICD-10-CM | POA: Diagnosis not present

## 2023-05-20 DIAGNOSIS — M549 Dorsalgia, unspecified: Secondary | ICD-10-CM | POA: Diagnosis not present

## 2023-05-27 ENCOUNTER — Ambulatory Visit (HOSPITAL_BASED_OUTPATIENT_CLINIC_OR_DEPARTMENT_OTHER): Payer: BC Managed Care – PPO | Attending: Family Medicine | Admitting: Physical Therapy

## 2023-05-27 ENCOUNTER — Encounter (HOSPITAL_BASED_OUTPATIENT_CLINIC_OR_DEPARTMENT_OTHER): Payer: Self-pay | Admitting: Physical Therapy

## 2023-05-27 DIAGNOSIS — R262 Difficulty in walking, not elsewhere classified: Secondary | ICD-10-CM | POA: Diagnosis not present

## 2023-05-27 DIAGNOSIS — M5459 Other low back pain: Secondary | ICD-10-CM | POA: Diagnosis not present

## 2023-05-27 DIAGNOSIS — M25561 Pain in right knee: Secondary | ICD-10-CM | POA: Diagnosis not present

## 2023-05-27 DIAGNOSIS — G8929 Other chronic pain: Secondary | ICD-10-CM | POA: Insufficient documentation

## 2023-05-27 DIAGNOSIS — M6281 Muscle weakness (generalized): Secondary | ICD-10-CM | POA: Insufficient documentation

## 2023-05-27 DIAGNOSIS — M25562 Pain in left knee: Secondary | ICD-10-CM | POA: Diagnosis not present

## 2023-05-27 NOTE — Therapy (Signed)
OUTPATIENT PHYSICAL THERAPY THORACOLUMBAR TREATMENT   Patient Name: Phyllis Calhoun MRN: [id number] DOB:1975/08/19, 47 y.o., female Today's Date: 05/27/2023    PT End of Session - 05/27/23 0745     Visit Number 2    Number of Visits 8    Date for PT Re-Evaluation 06/11/23    Authorization Type BCBS    PT Start Time 0730    PT Stop Time 0810    PT Time Calculation (min) 40 min    Activity Tolerance Patient tolerated treatment well    Behavior During Therapy WFL for tasks assessed/performed                  Past Medical History:  Diagnosis Date   Arthritis    both legs   Gallstones    HX OF SEVERAL GALLBLADDER ATTACKS   Headache(784.0)    PT STATES MIGRAINES FROM TRIGEMINAL NEURALGIA   Sleep apnea    sleep study confirmed   Trigeminal neuralgia    2012-PT CONTINUES TO HAVE PAIN RIGHT SIDE OF FACE   Past Surgical History:  Procedure Laterality Date   ABDOMINAL HYSTERECTOMY     BRAIN SURGERY     BREAST REDUCTION SURGERY     CHOLECYSTECTOMY N/A 05/25/2013   Procedure: LAPAROSCOPIC CHOLECYSTECTOMY WITH INTRAOPERATIVE CHOLANGIOGRAM;  Surgeon: Axel Filler, MD;  Location: [facility];  Service: General;  Laterality: N/A;   KNEE ARTHROSCOPY Left 12/29/2015   Procedure: ARTHROSCOPY KNEE;  Surgeon: Jodi Geralds, MD;  Location: [facility];  Service: Orthopedics;  Laterality: Left;   TONSILLECTOMY     Patient Active Problem List   Diagnosis Date Noted   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   Loud snoring 11/05/2018   OSA (obstructive sleep apnea) 11/05/2018   Chest pain 01/20/2016   Trigeminal neuralgia of right side of face 01/20/2016   Shortness of breath 01/20/2016   Acute medial meniscus tear of left knee 12/29/2015   Acute lateral meniscus tear of left knee 12/29/2015   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   Super obesity 12/29/2015    PCP: Farris Has, MD   REFERRING PROVIDER: Farris Has, MD   REFERRING DIAG:  M54.9  (ICD-10-CM) - Dorsalgia, unspecified  M25.569 (ICD-10-CM) - Pain in unspecified knee    Rationale for Evaluation and Treatment: Rehabilitation  THERAPY DIAG:  Muscle weakness (generalized)  Chronic pain of both knees  Other low back pain  ONSET DATE: 2 years ago after surgery, when they damaged the nerve  SUBJECTIVE:  SUBJECTIVE STATEMENT: Pt reports she has lost 41# since bariatric surgery.  She reports that she was instructed by Dr to walk more, so her knees and back are more sore.  She ambulated to pool with rollator.  She reports she woke up multiple times last night due to pain.   PERTINENT HISTORY:  Gastro Sleeve scheduled for Sept 16.  PAIN:  Are you having pain? Yes: NPRS scale: current 7/10 Pain location: LB across lumbar area with radiation into left buttock >R  Pain description: ache with radiation into left hip Aggravating factors: walking, lying or sitting ( 30 mins) too long Relieving factors: meds  PRECAUTIONS: None  RED FLAGS: None   WEIGHT BEARING RESTRICTIONS: No  FALLS:  Has patient fallen in last 6 months? No  LIVING ENVIRONMENT: Lives with: lives with their family, lives with their spouse, and mother and father Lives in: House/apartment Stairs: Yes: Internal: 16 steps; on right going up Has following equipment at home: Single point cane and Walker - 2 wheeled  OCCUPATION: disabled  PLOF: Requires assistive device for independence  PATIENT GOALS: feel better mentally and physically  NEXT MD VISIT: 1 week  OBJECTIVE:   DIAGNOSTIC FINDINGS:  None in chart  PATIENT SURVEYS:  FOTO Primary score 26% with goal of 37%    COGNITION: Overall cognitive status: Within functional limits for tasks assessed     SENSATION: WFL  MUSCLE LENGTH:     POSTURE:  No Significant postural limitations observed.  Difficult to identify due to body habitus.  PALPATION: Unable to test due to body habotus  LUMBAR ROM:   P!=pain AROM eval  Flexion FT to mid calf P!  Extension 5d  Right lateral flexion 100%  Left lateral flexion 85%  Right rotation   Left rotation    (Blank rows = not tested)  LOWER EXTREMITY ROM:    WFL.  Some limitations due to body habitus  LOWER EXTREMITY MMT:    MMT Right eval Left eval  Hip flexion 43.9 48.3  Hip extension    Hip abduction 32.4 39.9  Hip adduction    Hip internal rotation    Hip external rotation    Knee flexion    Knee extension 33.7 33.9  Ankle dorsiflexion    Ankle plantarflexion    Ankle inversion    Ankle eversion     (Blank rows = not tested)    FUNCTIONAL TESTS:  Timed up and go (TUG): 21.88 using cane 3 minute walk test: 287ft using rollator  GAIT: Distance walked: 400 ft Assistive device utilized:  rollator Level of assistance: Modified independence Comments: antalig gait R and L; lateral sway, slight circumduction of of hips bilaterally  TODAY'S TREATMENT:                                                                                                                              Pt seen for aquatic therapy today.  Treatment took place in  water 3.5-4.75 ft in depth at the [facility] pool. Temp of water was 91.  Pt entered/exited the pool via stairs independently in step-to pattern with bilat rail.  * straddling noodle and UE on yellow hand floats: cycling, cross country ski, alternating hip abdct/ add * holding wall:  alternating single leg clams * UE on yellow hand floats: farmer carry with bilat yellow hand floats under water at side walking forward/ backward, side stepping with arm addct/ abdct,   leg swings into hip abdct/ addct (without touching foot down in between reps- challenge); leg swings into hip flex/ ext;  high knee marching forward / backward with row  motion; bow and arrow x 5 each side (cues for technique), repeated with moving backwards  Pt requires the buoyancy and hydrostatic pressure of water for support, and to offload joints by unweighting joint load by at least 50 % in navel deep water and by at least 75-80% in chest to neck deep water.  Viscosity of the water is needed for resistance of strengthening. Water current perturbations provides challenge to standing balance requiring increased core activation.     PATIENT EDUCATION:  Education details: reacquainting with aquatic therapy Person educated: Patient Education method: Explanation  Education comprehension: verbalized understanding  HOME EXERCISE PROGRAM: Has previous from recent last episode  ASSESSMENT:  CLINICAL IMPRESSION:  Reviewed previous aquatic HEP with pt with good tolerance.  SLS exercise in water with dynamic opposite leg movement provides a good challenge.  She reported minor reduction of knee and back pain during session, to 6/10. Plan to update HEP in upcoming sessions, adding progression of exercises.  Goals are ongoing.     From initial evaluation:  Patient is a 47 y.o. f who was seen today for physical therapy evaluation and treatment for LBP/dorsalgia.  She is known well to this clinic as she had been dc in May after episode for OA knees.  She has since undergone and ablation of both areas without reduction of sx, rather verbalizes worsening.  She is scheduled to have bariatric surgery next week.  She will be delayed by at least 1 month before returning to aquatic therapy.  She will benefit from skilled aquatic therapy intervention to improve core strength and lumbosacral mobility, will continue to progress le strengthening and make additions to already established aquatic HEP.  She does have pool access.  Is considering membership here at [facility] to be able to access therapy pool.  OBJECTIVE IMPAIRMENTS: Abnormal gait, decreased activity tolerance,  decreased endurance, decreased mobility, difficulty walking, decreased strength, obesity, and pain.   ACTIVITY LIMITATIONS: carrying, lifting, bending, sitting, standing, squatting, sleeping, stairs, transfers, and locomotion level  PARTICIPATION LIMITATIONS: cleaning, shopping, community activity, and yard work  PERSONAL FACTORS: Fitness, Past/current experiences, Time since onset of injury/illness/exacerbation, and 1 comorbidity: morbid obesity  are also affecting patient's functional outcome.   REHAB POTENTIAL: Good  CLINICAL DECISION MAKING: Evolving/moderate complexity  EVALUATION COMPLEXITY: Moderate   GOALS: Goals reviewed with patient? Yes  SHORT TERM GOALS: Target date: 06/12/20  Pt will tolerate full aquatic sessions consistently without increase in pain and with improving function to demonstrate good toleration and effectiveness of intervention.  Baseline: Goal status: INITIAL  2.  Pt will improve on Tug test to <or=  18s to demonstrate improvement in lower extremity function, mobility and decreased fall risk. Baseline: 21.88 Goal status: INITIAL  3.  Pt will report improvement in sleep waking fewer then 2x nightly due to pain Baseline: multiple Goal status:  INITIAL  4.  Pt will report returning to [facility] (Or other pool access) to complete aquatic exercises to demonstrate commitment to manage chronic conditions and lose weight Baseline: stopped going Goal status: INITIAL   LONG TERM GOALS: Target date: 06/11/23  Pt to meet stated Foto Goal of 37% Baseline: 26 Goal status: INITIAL  2.  Pt will be indep with final HEP's (land and aquatic as appropriate) for continued management of condition Baseline:  Goal status: INITIAL  3.  Pt will improve on 3 minute walk test using rollator to >350 to demonstrate improvement in amb toleration Baseline: 250 ft Goal status: INITIAL  4.  Pt will improve strength in all areas listed by  at least 10 lbs to demonstrate improved  overall physical function  Baseline: See chart Goal status: INITIAL  5.  Pt will report decrease in daily pain to 4-5/10 or less to demonstrate improvement inmanagement of chronic condition. Baseline: 7/10 Goal status: INITIAL     PLAN:  PT FREQUENCY: 1x/week  PT DURATION: other: 12 weeks Will be delay of 6 weeks to initiate due to pending bariatric surgery scheduled 04/21/23  PLANNED INTERVENTIONS: Therapeutic exercises, Therapeutic activity, Neuromuscular re-education, Balance training, Gait training, Patient/Family education, Self Care, Joint mobilization, Joint manipulation, Stair training, Orthotic/Fit training, DME instructions, Aquatic Therapy, Dry Needling, Cryotherapy, Moist heat, Taping, Manual therapy, and Re-evaluation.  PLAN FOR NEXT SESSION: Aquatics: Core and LE strengthening. Stretching/ROM lumbosacral area. Progress and add to HEP for pt to complete indep at [facility] [place] 05/27/23 8:13 AM [facility] [address] [place], [place], [address] Phone: [phone]   Fax:  [phone]

## 2023-06-02 ENCOUNTER — Other Ambulatory Visit: Payer: Self-pay

## 2023-06-02 MED ORDER — MELOXICAM 15 MG PO TABS
15.0000 mg | ORAL_TABLET | Freq: Every day | ORAL | 2 refills | Status: DC
  Filled 2023-06-02 – 2023-07-28 (×2): qty 30, 30d supply, fill #0
  Filled 2023-08-28: qty 30, 30d supply, fill #1
  Filled 2023-09-27: qty 30, 30d supply, fill #2

## 2023-06-03 ENCOUNTER — Encounter (HOSPITAL_BASED_OUTPATIENT_CLINIC_OR_DEPARTMENT_OTHER): Payer: Self-pay | Admitting: Physical Therapy

## 2023-06-03 ENCOUNTER — Ambulatory Visit (HOSPITAL_BASED_OUTPATIENT_CLINIC_OR_DEPARTMENT_OTHER): Payer: BC Managed Care – PPO | Admitting: Physical Therapy

## 2023-06-03 DIAGNOSIS — G8929 Other chronic pain: Secondary | ICD-10-CM | POA: Diagnosis not present

## 2023-06-03 DIAGNOSIS — R262 Difficulty in walking, not elsewhere classified: Secondary | ICD-10-CM

## 2023-06-03 DIAGNOSIS — M6281 Muscle weakness (generalized): Secondary | ICD-10-CM

## 2023-06-03 DIAGNOSIS — M5459 Other low back pain: Secondary | ICD-10-CM | POA: Diagnosis not present

## 2023-06-03 DIAGNOSIS — M25561 Pain in right knee: Secondary | ICD-10-CM | POA: Diagnosis not present

## 2023-06-03 DIAGNOSIS — M25562 Pain in left knee: Secondary | ICD-10-CM | POA: Diagnosis not present

## 2023-06-03 NOTE — Therapy (Signed)
OUTPATIENT PHYSICAL THERAPY THORACOLUMBAR TREATMENT   Patient Name: Phyllis Calhoun MRN: [id number] DOB:Feb 11, 1976, 47 y.o., female Today's Date: 06/03/2023    PT End of Session - 06/03/23 0750     Visit Number 3    Number of Visits 8    Date for PT Re-Evaluation 06/11/23    Authorization Type BCBS    PT Start Time 0730    PT Stop Time 0811    PT Time Calculation (min) 41 min    Activity Tolerance Patient tolerated treatment well    Behavior During Therapy WFL for tasks assessed/performed                  Past Medical History:  Diagnosis Date   Arthritis    both legs   Gallstones    HX OF SEVERAL GALLBLADDER ATTACKS   Headache(784.0)    PT STATES MIGRAINES FROM TRIGEMINAL NEURALGIA   Sleep apnea    sleep study confirmed   Trigeminal neuralgia    2012-PT CONTINUES TO HAVE PAIN RIGHT SIDE OF FACE   Past Surgical History:  Procedure Laterality Date   ABDOMINAL HYSTERECTOMY     BRAIN SURGERY     BREAST REDUCTION SURGERY     CHOLECYSTECTOMY N/A 05/25/2013   Procedure: LAPAROSCOPIC CHOLECYSTECTOMY WITH INTRAOPERATIVE CHOLANGIOGRAM;  Surgeon: Axel Filler, MD;  Location: [facility];  Service: General;  Laterality: N/A;   KNEE ARTHROSCOPY Left 12/29/2015   Procedure: ARTHROSCOPY KNEE;  Surgeon: Jodi Geralds, MD;  Location: [facility];  Service: Orthopedics;  Laterality: Left;   TONSILLECTOMY     Patient Active Problem List   Diagnosis Date Noted   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   Loud snoring 11/05/2018   OSA (obstructive sleep apnea) 11/05/2018   Chest pain 01/20/2016   Trigeminal neuralgia of right side of face 01/20/2016   Shortness of breath 01/20/2016   Acute medial meniscus tear of left knee 12/29/2015   Acute lateral meniscus tear of left knee 12/29/2015   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   Super obesity 12/29/2015    PCP: Farris Has, MD   REFERRING PROVIDER: Farris Has, MD   REFERRING DIAG:  M54.9  (ICD-10-CM) - Dorsalgia, unspecified  M25.569 (ICD-10-CM) - Pain in unspecified knee    Rationale for Evaluation and Treatment: Rehabilitation  THERAPY DIAG:  No diagnosis found.  ONSET DATE: 2 years ago after surgery, when they damaged the nerve  SUBJECTIVE:  SUBJECTIVE STATEMENT: Pt reports she ran out of pain medicine and will not be able to refill until Friday (3 days away).   PERTINENT HISTORY:  Gastro Sleeve scheduled for Sept 16.  PAIN:  Are you having pain? Yes: NPRS scale: current 6/10 Pain location: LB across lumbar area with radiation into left buttock >R , bilat knees (L>R) Pain description: ache with radiation into left hip Aggravating factors: walking, lying or sitting ( 30 mins) too long Relieving factors: meds  PRECAUTIONS: None  RED FLAGS: None   WEIGHT BEARING RESTRICTIONS: No  FALLS:  Has patient fallen in last 6 months? No  LIVING ENVIRONMENT: Lives with: lives with their family, lives with their spouse, and mother and father Lives in: House/apartment Stairs: Yes: Internal: 16 steps; on right going up Has following equipment at home: Single point cane and Walker - 2 wheeled  OCCUPATION: disabled  PLOF: Requires assistive device for independence  PATIENT GOALS: feel better mentally and physically  NEXT MD VISIT: 1 week  OBJECTIVE:   DIAGNOSTIC FINDINGS:  None in chart  PATIENT SURVEYS:  FOTO Primary score 26% with goal of 37%    COGNITION: Overall cognitive status: Within functional limits for tasks assessed     SENSATION: WFL  MUSCLE LENGTH:     POSTURE: No Significant postural limitations observed.  Difficult to identify due to body habitus.  PALPATION: Unable to test due to body habotus  LUMBAR ROM:   P!=pain AROM eval  Flexion FT to  mid calf P!  Extension 5d  Right lateral flexion 100%  Left lateral flexion 85%  Right rotation   Left rotation    (Blank rows = not tested)  LOWER EXTREMITY ROM:    WFL.  Some limitations due to body habitus  LOWER EXTREMITY MMT:    MMT Right eval Left eval  Hip flexion 43.9 48.3  Hip extension    Hip abduction 32.4 39.9  Hip adduction    Hip internal rotation    Hip external rotation    Knee flexion    Knee extension 33.7 33.9  Ankle dorsiflexion    Ankle plantarflexion    Ankle inversion    Ankle eversion     (Blank rows = not tested)    FUNCTIONAL TESTS:  Timed up and go (TUG): 21.88 using cane 3 minute walk test: 285ft using rollator  GAIT: Distance walked: 400 ft Assistive device utilized:  rollator Level of assistance: Modified independence Comments: antalig gait R and L; lateral sway, slight circumduction of of hips bilaterally  TODAY'S TREATMENT:                                                                                                                              Pt seen for aquatic therapy today.  Treatment took place in water 3.5-4.75 ft in depth at the [facility] pool. Temp of water was 91.  Pt entered/exited the pool via stairs independently in step-to pattern  with bilat rail. * warm up of walking forward/ backwards and side stepping * walking split squats forward/ backward * moving jumping jacks in 4+ ft of water * UE on yellow hand floats:  hip abdct/ addct x 12 each; leg swings into hip flex/ext;  light jumps into hip abdct and light jumps alternating forward kicks * straddling noodle and UE on corner: cycling, cross country ski, alternating hip abdct/ add * quad stretch with foot on 2nd step x 2 LLE, x 1 RLE;  hamstring stretch with foot on 2nd step x 10-15s x 2 each LE * return to walking for recovery * return to vertical decompression in corner with noodle under arms  PATIENT EDUCATION:  Education details: reacquainting with  aquatic therapy Person educated: Patient Education method: Explanation  Education comprehension: verbalized understanding  HOME EXERCISE PROGRAM: Has previous from recent last episode  ASSESSMENT:  CLINICAL IMPRESSION: Pt requested to trial some water exercises she saw on internet; gave feedback on form and suggested less bouncing due to ground reactive force through knees and back.  She reported mild increase in pain in knees and back after completing stretches at stairs; reduced with vertical suspension in deeper water. Plan to update HEP in upcoming session, adding progression of exercises and stretches.  Goals are ongoing. Therapist to assess goals next visit prior to end of POC.     From initial evaluation:  Patient is a 47 y.o. f who was seen today for physical therapy evaluation and treatment for LBP/dorsalgia.  She is known well to this clinic as she had been dc in May after episode for OA knees.  She has since undergone and ablation of both areas without reduction of sx, rather verbalizes worsening.  She is scheduled to have bariatric surgery next week.  She will be delayed by at least 1 month before returning to aquatic therapy.  She will benefit from skilled aquatic therapy intervention to improve core strength and lumbosacral mobility, will continue to progress le strengthening and make additions to already established aquatic HEP.  She does have pool access.  Is considering membership here at [facility] to be able to access therapy pool.  OBJECTIVE IMPAIRMENTS: Abnormal gait, decreased activity tolerance, decreased endurance, decreased mobility, difficulty walking, decreased strength, obesity, and pain.   ACTIVITY LIMITATIONS: carrying, lifting, bending, sitting, standing, squatting, sleeping, stairs, transfers, and locomotion level  PARTICIPATION LIMITATIONS: cleaning, shopping, community activity, and yard work  PERSONAL FACTORS: Fitness, Past/current experiences, Time since  onset of injury/illness/exacerbation, and 1 comorbidity: morbid obesity  are also affecting patient's functional outcome.   REHAB POTENTIAL: Good  CLINICAL DECISION MAKING: Evolving/moderate complexity  EVALUATION COMPLEXITY: Moderate   GOALS: Goals reviewed with patient? Yes  SHORT TERM GOALS: Target date: 06/12/20  Pt will tolerate full aquatic sessions consistently without increase in pain and with improving function to demonstrate good toleration and effectiveness of intervention.  Baseline: Goal status: INITIAL  2.  Pt will improve on Tug test to <or=  18s to demonstrate improvement in lower extremity function, mobility and decreased fall risk. Baseline: 21.88 Goal status: INITIAL  3.  Pt will report improvement in sleep waking fewer then 2x nightly due to pain Baseline: multiple Goal status: INITIAL  4.  Pt will report returning to [facility] (Or other pool access) to complete aquatic exercises to demonstrate commitment to manage chronic conditions and lose weight Baseline: stopped going Goal status: INITIAL   LONG TERM GOALS: Target date: 06/11/23  Pt to meet stated Foto Goal  of 37% Baseline: 26 Goal status: INITIAL  2.  Pt will be indep with final HEP's (land and aquatic as appropriate) for continued management of condition Baseline:  Goal status: INITIAL  3.  Pt will improve on 3 minute walk test using rollator to >350 to demonstrate improvement in amb toleration Baseline: 250 ft Goal status: INITIAL  4.  Pt will improve strength in all areas listed by  at least 10 lbs to demonstrate improved overall physical function  Baseline: See chart Goal status: INITIAL  5.  Pt will report decrease in daily pain to 4-5/10 or less to demonstrate improvement inmanagement of chronic condition. Baseline: 7/10 Goal status: INITIAL     PLAN:  PT FREQUENCY: 1x/week  PT DURATION: other: 12 weeks Will be delay of 6 weeks to initiate due to pending bariatric surgery scheduled  04/21/23  PLANNED INTERVENTIONS: Therapeutic exercises, Therapeutic activity, Neuromuscular re-education, Balance training, Gait training, Patient/Family education, Self Care, Joint mobilization, Joint manipulation, Stair training, Orthotic/Fit training, DME instructions, Aquatic Therapy, Dry Needling, Cryotherapy, Moist heat, Taping, Manual therapy, and Re-evaluation.  PLAN FOR NEXT SESSION: Aquatics: Core and LE strengthening. Stretching/ROM lumbosacral area. Progress and add to HEP for pt to complete indep at [facility] [place] 06/03/23 8:23 AM [facility] [address] [place], [place], [address] Phone: [phone]   Fax:  [phone]

## 2023-06-04 ENCOUNTER — Encounter (HOSPITAL_COMMUNITY): Payer: Self-pay

## 2023-06-04 ENCOUNTER — Other Ambulatory Visit (HOSPITAL_COMMUNITY): Payer: Self-pay

## 2023-06-05 ENCOUNTER — Encounter (HOSPITAL_BASED_OUTPATIENT_CLINIC_OR_DEPARTMENT_OTHER): Payer: Self-pay | Admitting: Physical Therapy

## 2023-06-05 ENCOUNTER — Other Ambulatory Visit: Payer: Self-pay

## 2023-06-05 ENCOUNTER — Ambulatory Visit (HOSPITAL_BASED_OUTPATIENT_CLINIC_OR_DEPARTMENT_OTHER): Payer: BC Managed Care – PPO | Admitting: Physical Therapy

## 2023-06-05 DIAGNOSIS — M5459 Other low back pain: Secondary | ICD-10-CM | POA: Diagnosis not present

## 2023-06-05 DIAGNOSIS — R262 Difficulty in walking, not elsewhere classified: Secondary | ICD-10-CM | POA: Diagnosis not present

## 2023-06-05 DIAGNOSIS — M25562 Pain in left knee: Secondary | ICD-10-CM | POA: Diagnosis not present

## 2023-06-05 DIAGNOSIS — M6281 Muscle weakness (generalized): Secondary | ICD-10-CM

## 2023-06-05 DIAGNOSIS — M25561 Pain in right knee: Secondary | ICD-10-CM | POA: Diagnosis not present

## 2023-06-05 DIAGNOSIS — G8929 Other chronic pain: Secondary | ICD-10-CM

## 2023-06-05 NOTE — Therapy (Addendum)
OUTPATIENT PHYSICAL THERAPY THORACOLUMBAR TREATMENT   Patient Name: Phyllis Calhoun MRN: [id number] DOB:08-23-1975, 47 y.o., female Today's Date: 06/05/2023    PT End of Session - 06/05/23 1418     Visit Number 5    Number of Visits 8    Date for PT Re-Evaluation 06/11/23    Authorization Type BCBS    PT Start Time 1415    PT Stop Time 1500    PT Time Calculation (min) 45 min    Activity Tolerance Patient tolerated treatment well    Behavior During Therapy WFL for tasks assessed/performed                  Past Medical History:  Diagnosis Date   Arthritis    both legs   Gallstones    HX OF SEVERAL GALLBLADDER ATTACKS   Headache(784.0)    PT STATES MIGRAINES FROM TRIGEMINAL NEURALGIA   Sleep apnea    sleep study confirmed   Trigeminal neuralgia    2012-PT CONTINUES TO HAVE PAIN RIGHT SIDE OF FACE   Past Surgical History:  Procedure Laterality Date   ABDOMINAL HYSTERECTOMY     BRAIN SURGERY     BREAST REDUCTION SURGERY     CHOLECYSTECTOMY N/A 05/25/2013   Procedure: LAPAROSCOPIC CHOLECYSTECTOMY WITH INTRAOPERATIVE CHOLANGIOGRAM;  Surgeon: Axel Filler, MD;  Location: [facility];  Service: General;  Laterality: N/A;   KNEE ARTHROSCOPY Left 12/29/2015   Procedure: ARTHROSCOPY KNEE;  Surgeon: Jodi Geralds, MD;  Location: [facility];  Service: Orthopedics;  Laterality: Left;   TONSILLECTOMY     Patient Active Problem List   Diagnosis Date Noted   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   Loud snoring 11/05/2018   OSA (obstructive sleep apnea) 11/05/2018   Chest pain 01/20/2016   Trigeminal neuralgia of right side of face 01/20/2016   Shortness of breath 01/20/2016   Acute medial meniscus tear of left knee 12/29/2015   Acute lateral meniscus tear of left knee 12/29/2015   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   Super obesity 12/29/2015    PCP: Farris Has, MD   REFERRING PROVIDER: Farris Has, MD   REFERRING DIAG:  M54.9  (ICD-10-CM) - Dorsalgia, unspecified  M25.569 (ICD-10-CM) - Pain in unspecified knee    Rationale for Evaluation and Treatment: Rehabilitation  THERAPY DIAG:  Muscle weakness (generalized)  Chronic pain of both knees  Other low back pain  Difficulty in walking, not elsewhere classified  ONSET DATE: 2 years ago after surgery, when they damaged the nerve  SUBJECTIVE:  SUBJECTIVE STATEMENT: Pt reports increase in LB and knee soreness after trying some water exercises she saw on the intranet last session  PERTINENT HISTORY:  Gastro Sleeve scheduled for Sept 16.  PAIN:  Are you having pain? Yes: NPRS scale: current 6-7/10 Pain location: LB across lumbar area with radiation into left buttock >R , bilat knees (L>R) Pain description: ache with radiation into left hip Aggravating factors: walking, lying or sitting ( 30 mins) too long Relieving factors: meds  PRECAUTIONS: None  RED FLAGS: None   WEIGHT BEARING RESTRICTIONS: No  FALLS:  Has patient fallen in last 6 months? No  LIVING ENVIRONMENT: Lives with: lives with their family, lives with their spouse, and mother and father Lives in: House/apartment Stairs: Yes: Internal: 16 steps; on right going up Has following equipment at home: Single point cane and Walker - 2 wheeled  OCCUPATION: disabled  PLOF: Requires assistive device for independence  PATIENT GOALS: feel better mentally and physically  NEXT MD VISIT: 1 week  OBJECTIVE:   DIAGNOSTIC FINDINGS:  None in chart  PATIENT SURVEYS:  FOTO Primary score 26% with goal of 37%    COGNITION: Overall cognitive status: Within functional limits for tasks assessed     SENSATION: WFL  MUSCLE LENGTH:     POSTURE: No Significant postural limitations observed.  Difficult to  identify due to body habitus.  PALPATION: Unable to test due to body habotus  LUMBAR ROM:   P!=pain AROM eval  Flexion FT to mid calf P!  Extension 5d  Right lateral flexion 100%  Left lateral flexion 85%  Right rotation   Left rotation    (Blank rows = not tested)  LOWER EXTREMITY ROM:    WFL.  Some limitations due to body habitus  LOWER EXTREMITY MMT:    MMT Right eval Left eval  Hip flexion 43.9 48.3  Hip extension    Hip abduction 32.4 39.9  Hip adduction    Hip internal rotation    Hip external rotation    Knee flexion    Knee extension 33.7 33.9  Ankle dorsiflexion    Ankle plantarflexion    Ankle inversion    Ankle eversion     (Blank rows = not tested)    FUNCTIONAL TESTS:  Timed up and go (TUG): 21.88 using cane 3 minute walk test: 271ft using rollator  GAIT: Distance walked: 400 ft Assistive device utilized:  rollator Level of assistance: Modified independence Comments: antalig gait R and L; lateral sway, slight circumduction of of hips bilaterally  TODAY'S TREATMENT:                                                                                                                              Pt seen for aquatic therapy today.  Treatment took place in water 3.5-4.75 ft in depth at the [facility] pool. Temp of water was 91.  Pt entered/exited the pool via stairs independently in step-to pattern with  bilat rail.   Exercises - Hand Buoy Carry forward, back and side stepping yellow HB - Standing Hip Hinge  instruction, TC for execution - Side Stepping with Hand Floats  side lunge - Standing Hip Abduction Adduction Ue support HB 2 x 10 - Standing Hip Flexion Extension Ue support HB 2 x 10 - Heel Toe Raises Ue support HB 2x10 - Standing Hip Circles at Pool Wall  x10 R/L cw&ccw - Standing Hip Internal and External Rotation x 10 - Squat 2x10 - Seated Straddle on Flotation cycling-,Flutter Kicking/Windshield Wipers intervals 20 fast  PATIENT  EDUCATION:  Education details: reacquainting with aquatic therapy Person educated: Patient Education method: Explanation  Education comprehension: verbalized understanding  HOME EXERCISE PROGRAM: Access Code: [email or web address] (re-assigned program as previous was not saved from last session) URL: [email or web address] Date: 06/05/2023 Prepared by: Geni Bers  This aquatic home exercise program from Med[employer] utilizes pictures from land based exercises, but has been adapted prior to lamination and issuance.   Exercises - Hand Buoy Carry  - Sit to Stand  - 1 x daily - 1-3 x weekly - 1-3 sets - 10 reps - Standing Hip Hinge  - 1 x daily - 1-3 x weekly - 1-2 sets - 10 reps - Side Stepping with Hand Floats  - 1-3 x weekly - Standing Hip Abduction Adduction at Pool Wall  - 1 x daily - 1-3 x weekly - 1-3 sets - 10 reps - Standing Hip Flexion Extension at [employer]  - 1 x daily - 1-3 x weekly - 1-3 sets - 10 reps - Heel Toe Raises at Pool Wall  - 1 x daily - 1-3 x weekly - 1-3 sets - 10 reps - Standing Hip Circles at [employer]  - 1 x daily - 1-3 x weekly - 1-3 sets - 10 reps - Standing Hip Internal and External Rotation  - 1 x daily - 1-3 x weekly - 1-3 sets - 10 reps - Flutter Kicking/Windshield Wipers  - 1 x daily - 1-3 x weekly - 1-3 sets - 10-20 reps - Squat  - 1 x daily - 1-3 x weekly - 1-2 sets - 10 reps - Seated Straddle on Flotation Forward a Bicycle Legs   ASSESSMENT:  CLINICAL IMPRESSION: Pt reports increase in knee and LBP with exercises she found on the intranet. She is encouraged to be mindful of loading/impact as she is bone on bone in knees.  She VU.  I added and re-created pt aquatic HEP.  She was instructed through tit with tips on energy burn -added intervals with hip add/abd and flex/ext in suspended supine, vertical or sitting. She reports continuing wakingat night up to 3 x but feels it is due to needing a new mattress. Pt is declining land based intervention.   Prefers to continue indep at [facility] pool.  Next session will be last.  Plan on dc.      From initial evaluation:  Patient is a 47 y.o. f who was seen today for physical therapy evaluation and treatment for LBP/dorsalgia.  She is known well to this clinic as she had been dc in May after episode for OA knees.  She has since undergone and ablation of both areas without reduction of sx, rather verbalizes worsening.  She is scheduled to have bariatric surgery next week.  She will be delayed by at least 1 month before returning to aquatic therapy.  She will benefit from skilled aquatic therapy intervention to improve core strength and  lumbosacral mobility, will continue to progress le strengthening and make additions to already established aquatic HEP.  She does have pool access.  Is considering membership here at [facility] to be able to access therapy pool.  OBJECTIVE IMPAIRMENTS: Abnormal gait, decreased activity tolerance, decreased endurance, decreased mobility, difficulty walking, decreased strength, obesity, and pain.   ACTIVITY LIMITATIONS: carrying, lifting, bending, sitting, standing, squatting, sleeping, stairs, transfers, and locomotion level  PARTICIPATION LIMITATIONS: cleaning, shopping, community activity, and yard work  PERSONAL FACTORS: Fitness, Past/current experiences, Time since onset of injury/illness/exacerbation, and 1 comorbidity: morbid obesity  are also affecting patient's functional outcome.   REHAB POTENTIAL: Good  CLINICAL DECISION MAKING: Evolving/moderate complexity  EVALUATION COMPLEXITY: Moderate   GOALS: Goals reviewed with patient? Yes  SHORT TERM GOALS: Target date: 06/12/20  Pt will tolerate full aquatic sessions consistently without increase in pain and with improving function to demonstrate good toleration and effectiveness of intervention.  Baseline: Goal status: Met 06/05/23  2.  Pt will improve on Tug test to <or=  18s to demonstrate improvement in  lower extremity function, mobility and decreased fall risk. Baseline: 21.88 Goal status: INITIAL  3.  Pt will report improvement in sleep waking fewer then 2x nightly due to pain Baseline: multiple Goal status: INITIAL  4.  Pt will report returning to [facility] (Or other pool access) to complete aquatic exercises to demonstrate commitment to manage chronic conditions and lose weight Baseline: stopped going Goal status: Met 06/05/23   LONG TERM GOALS: Target date: 06/13/23  Pt to meet stated Foto Goal of 37% Baseline: 26 Goal status: INITIAL  2.  Pt will be indep with final HEP's (land and aquatic as appropriate) for continued management of condition Baseline:  Goal status: in progress 06/05/23  3.  Pt will improve on 3 minute walk test using rollator to >350 to demonstrate improvement in amb toleration Baseline: 250 ft Goal status: INITIAL  4.  Pt will improve strength in all areas listed by  at least 10 lbs to demonstrate improved overall physical function  Baseline: See chart Goal status: INITIAL  5.  Pt will report decrease in daily pain to 4-5/10 or less to demonstrate improvement inmanagement of chronic condition. Baseline: 7/10 Goal status: INITIAL     PLAN:  PT FREQUENCY: 1x/week  PT DURATION: other: 12 weeks Will be delay of 6 weeks to initiate due to pending bariatric surgery scheduled 04/21/23  PLANNED INTERVENTIONS: Therapeutic exercises, Therapeutic activity, Neuromuscular re-education, Balance training, Gait training, Patient/Family education, Self Care, Joint mobilization, Joint manipulation, Stair training, Orthotic/Fit training, DME instructions, Aquatic Therapy, Dry Needling, Cryotherapy, Moist heat, Taping, Manual therapy, and Re-evaluation.  PLAN FOR NEXT SESSION: Aquatics: Core and LE strengthening. Stretching/ROM lumbosacral area. Progress and add to HEP for pt to complete indep at [facility]) Zahniya Zellars MPT 06/05/23 3:29 PM [facility] [address] [place], [place], [address] Phone: [phone]   Fax:  [phone]  Addend: [address] [place]) Courtany Mcmurphy MPT 06/05/23 3:29 PM [facility] [address] [place], [place], [address] Phone: [phone]   Fax:  [phone]

## 2023-06-06 ENCOUNTER — Other Ambulatory Visit: Payer: Self-pay

## 2023-06-10 DIAGNOSIS — Z1231 Encounter for screening mammogram for malignant neoplasm of breast: Secondary | ICD-10-CM | POA: Diagnosis not present

## 2023-06-10 DIAGNOSIS — R92323 Mammographic fibroglandular density, bilateral breasts: Secondary | ICD-10-CM | POA: Diagnosis not present

## 2023-06-11 ENCOUNTER — Encounter (HOSPITAL_BASED_OUTPATIENT_CLINIC_OR_DEPARTMENT_OTHER): Payer: Self-pay | Admitting: Physical Therapy

## 2023-06-11 ENCOUNTER — Ambulatory Visit (HOSPITAL_BASED_OUTPATIENT_CLINIC_OR_DEPARTMENT_OTHER): Payer: BC Managed Care – PPO | Attending: Family Medicine | Admitting: Physical Therapy

## 2023-06-11 DIAGNOSIS — M25562 Pain in left knee: Secondary | ICD-10-CM | POA: Insufficient documentation

## 2023-06-11 DIAGNOSIS — G8929 Other chronic pain: Secondary | ICD-10-CM | POA: Insufficient documentation

## 2023-06-11 DIAGNOSIS — M25561 Pain in right knee: Secondary | ICD-10-CM | POA: Insufficient documentation

## 2023-06-11 DIAGNOSIS — R262 Difficulty in walking, not elsewhere classified: Secondary | ICD-10-CM | POA: Insufficient documentation

## 2023-06-11 DIAGNOSIS — M5459 Other low back pain: Secondary | ICD-10-CM | POA: Insufficient documentation

## 2023-06-11 DIAGNOSIS — M6281 Muscle weakness (generalized): Secondary | ICD-10-CM | POA: Insufficient documentation

## 2023-06-11 NOTE — Therapy (Signed)
OUTPATIENT PHYSICAL THERAPY THORACOLUMBAR TREATMENT   Patient Name: Phyllis Calhoun MRN: [id number] DOB:26-Aug-1975, 47 y.o., female Today's Date: 06/11/2023    PT End of Session - 06/11/23 0944     Visit Number 6    Number of Visits 8    Date for PT Re-Evaluation 06/11/23    Authorization Type [employer]    PT Start Time 0945    PT Stop Time 1030    PT Time Calculation (min) 45 min    Activity Tolerance Patient tolerated treatment well    Behavior During Therapy WFL for tasks assessed/performed                  Past Medical History:  Diagnosis Date   Arthritis    both legs   Gallstones    HX OF SEVERAL GALLBLADDER ATTACKS   Headache(784.0)    PT STATES MIGRAINES FROM TRIGEMINAL NEURALGIA   Sleep apnea    sleep study confirmed   Trigeminal neuralgia    2012-PT CONTINUES TO HAVE PAIN RIGHT SIDE OF FACE   Past Surgical History:  Procedure Laterality Date   ABDOMINAL HYSTERECTOMY     BRAIN SURGERY     BREAST REDUCTION SURGERY     CHOLECYSTECTOMY N/A 05/25/2013   Procedure: LAPAROSCOPIC CHOLECYSTECTOMY WITH INTRAOPERATIVE CHOLANGIOGRAM;  Surgeon: Axel Filler, MD;  Location: [facility];  Service: General;  Laterality: N/A;   KNEE ARTHROSCOPY Left 12/29/2015   Procedure: ARTHROSCOPY KNEE;  Surgeon: Jodi Geralds, MD;  Location: [facility];  Service: Orthopedics;  Laterality: Left;   TONSILLECTOMY     Patient Active Problem List   Diagnosis Date Noted   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   Loud snoring 11/05/2018   OSA (obstructive sleep apnea) 11/05/2018   Chest pain 01/20/2016   Trigeminal neuralgia of right side of face 01/20/2016   Shortness of breath 01/20/2016   Acute medial meniscus tear of left knee 12/29/2015   Acute lateral meniscus tear of left knee 12/29/2015   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   Super obesity 12/29/2015    PCP: Farris Has, MD   REFERRING PROVIDER: Farris Has, MD   REFERRING DIAG:  M54.9  (ICD-10-CM) - Dorsalgia, unspecified  M25.569 (ICD-10-CM) - Pain in unspecified knee    Rationale for Evaluation and Treatment: Rehabilitation  THERAPY DIAG:  Muscle weakness (generalized)  Chronic pain of both knees  Other low back pain  Difficulty in walking, not elsewhere classified  ONSET DATE: 2 years ago after surgery, when they damaged the nerve  SUBJECTIVE:  SUBJECTIVE STATEMENT: Went to [facility] [place] stayed for about 1 hour.  PERTINENT HISTORY:  Gastro Sleeve scheduled for Sept 16.  PAIN:  Are you having pain? Yes: NPRS scale: current 6/10 LB; knees 7/10 Pain location: LB across lumbar area with radiation into left buttock >R , bilat knees (L>R) Pain description: ache with radiation into left hip Aggravating factors: walking, lying or sitting ( 30 mins) too long Relieving factors: meds  PRECAUTIONS: None  RED FLAGS: None   WEIGHT BEARING RESTRICTIONS: No  FALLS:  Has patient fallen in last 6 months? No  LIVING ENVIRONMENT: Lives with: lives with their family, lives with their spouse, and mother and father Lives in: House/apartment Stairs: Yes: Internal: 16 steps; on right going up Has following equipment at home: Single point cane and Walker - 2 wheeled  OCCUPATION: disabled  PLOF: Requires assistive device for independence  PATIENT GOALS: feel better mentally and physically  NEXT MD VISIT: 1 week  OBJECTIVE:   DIAGNOSTIC FINDINGS:  None in chart  PATIENT SURVEYS:  FOTO Primary score 26% with goal of 37%    COGNITION: Overall cognitive status: Within functional limits for tasks assessed     SENSATION: WFL  MUSCLE LENGTH:     POSTURE: No Significant postural limitations observed.  Difficult to identify due to body habitus.  PALPATION: Unable to test due to  body habotus  LUMBAR ROM:   P!=pain AROM eval  Flexion FT to mid calf P!  Extension 5d  Right lateral flexion 100%  Left lateral flexion 85%  Right rotation   Left rotation    (Blank rows = not tested)  LOWER EXTREMITY ROM:    WFL.  Some limitations due to body habitus  LOWER EXTREMITY MMT:    MMT Right eval Left eval R / L 06/11/23  Hip flexion 43.9 48.3 63.0 / 66.8  Hip extension     Hip abduction 32.4 39.9 44.7 / 48.4  Hip adduction     Hip internal rotation     Hip external rotation     Knee flexion     Knee extension 33.7 33.9 Pt declined due to pain  Ankle dorsiflexion     Ankle plantarflexion     Ankle inversion     Ankle eversion      (Blank rows = not tested)    FUNCTIONAL TESTS:  Timed up and go (TUG): 21.88 using cane 3 minute walk test: 262ft using rollator  06/11/23 TUG:17.01 3  minute walk test (limited due to knee pain)  GAIT: Distance walked: 400 ft Assistive device utilized:  rollator Level of assistance: Modified independence Comments: antalig gait R and L; lateral sway, slight circumduction of of hips bilaterally  TODAY'S TREATMENT:                                                                                                                              Pt seen for aquatic therapy today.  Treatment took place  in water 3.5-4.75 ft in depth at the [facility] pool. Temp of water was 91.  Pt entered/exited the pool via stairs independently in step-to pattern with bilat rail.   Exercises - Hand Buoy Carry forward, back and side stepping yellow HB - Standing Hip Hinge  instruction - Side Stepping with Hand Floats  side lunge - Standing Hip Abduction Adduction Ue support HB 2 x 10 - Standing Hip Flexion Extension Ue support HB 2 x 10 - Seated Straddle on Flotation cycling-Flutter Kicking/Windshield Wipers intervals 20 fast  PATIENT EDUCATION:  Education details: reacquainting with aquatic therapy Person educated:  Patient Education method: Explanation  Education comprehension: verbalized understanding  HOME EXERCISE PROGRAM: Access Code: [email or web address] (re-assigned program as previous was not saved from last session) URL: [email or web address] Date: 06/05/2023 Prepared by: Geni Bers  This aquatic home exercise program from MedBridge utilizes pictures from land based exercises, but has been adapted prior to lamination and issuance.   Exercises - Hand Buoy Carry  - Sit to Stand  - 1 x daily - 1-3 x weekly - 1-3 sets - 10 reps - Standing Hip Hinge  - 1 x daily - 1-3 x weekly - 1-2 sets - 10 reps - Side Stepping with Hand Floats  - 1-3 x weekly - Standing Hip Abduction Adduction at Pool Wall  - 1 x daily - 1-3 x weekly - 1-3 sets - 10 reps - Standing Hip Flexion Extension at [employer]  - 1 x daily - 1-3 x weekly - 1-3 sets - 10 reps - Heel Toe Raises at Pool Wall  - 1 x daily - 1-3 x weekly - 1-3 sets - 10 reps - Standing Hip Circles at [employer]  - 1 x daily - 1-3 x weekly - 1-3 sets - 10 reps - Standing Hip Internal and External Rotation  - 1 x daily - 1-3 x weekly - 1-3 sets - 10 reps - Flutter Kicking/Windshield Wipers  - 1 x daily - 1-3 x weekly - 1-3 sets - 10-20 reps - Squat  - 1 x daily - 1-3 x weekly - 1-2 sets - 10 reps - Seated Straddle on Flotation Forward a Bicycle Legs   ASSESSMENT:  CLINICAL IMPRESSION: Pt reports completing HEP at [facility].  She presents today with program in hand.  She is given further instruction verbal and demonstration on hip hinging which she executes well. All other exercises completed indep.  Pt edu on burning energy for weight loss. Pt continues to be more limited ue to knee pain than LBP which does play roll in goals not met. DC testing complete. Pt has reached her max potential in setting.  She has declined transition to land based intervention.  She will continue with Aquatic HEP.       OBJECTIVE IMPAIRMENTS: Abnormal gait, decreased activity  tolerance, decreased endurance, decreased mobility, difficulty walking, decreased strength, obesity, and pain.   ACTIVITY LIMITATIONS: carrying, lifting, bending, sitting, standing, squatting, sleeping, stairs, transfers, and locomotion level  PARTICIPATION LIMITATIONS: cleaning, shopping, community activity, and yard work  PERSONAL FACTORS: Fitness, Past/current experiences, Time since onset of injury/illness/exacerbation, and 1 comorbidity: morbid obesity  are also affecting patient's functional outcome.   REHAB POTENTIAL: Good  CLINICAL DECISION MAKING: Evolving/moderate complexity  EVALUATION COMPLEXITY: Moderate   GOALS: Goals reviewed with patient? Yes  SHORT TERM GOALS: Target date: 06/12/20  Pt will tolerate full aquatic sessions consistently without increase in pain and with improving function to demonstrate good toleration and effectiveness of  intervention.  Baseline: Goal status: Met 06/05/23  2.  Pt will improve on Tug test to <or=  18s to demonstrate improvement in lower extremity function, mobility and decreased fall risk. Baseline: 21.88 Goal status: Met 17.01 06/11/23  3.  Pt will report improvement in sleep waking fewer then 2x nightly due to pain Baseline: multiple Goal status: Not Met 3 x nightly 06/11/23  4.  Pt will report returning to [facility] (Or other pool access) to complete aquatic exercises to demonstrate commitment to manage chronic conditions and lose weight Baseline: stopped going Goal status: Met 06/05/23   LONG TERM GOALS: Target date: 06/13/23  Pt to meet stated Foto Goal of 37% Baseline: 26 Goal status: Met 06/11/23  2.  Pt will be indep with final HEP's (land and aquatic as appropriate) for continued management of condition Baseline:  Goal status: in progress 06/05/23; Met 06/11/23  3.  Pt will improve on 3 minute walk test using rollator to >350 to demonstrate improvement in amb toleration Baseline: 250 ft Goal status: Not Met (292 ft)   06/11/23  4.  Pt will improve strength in all areas listed by  at least 10 lbs to demonstrate improved overall physical function  Baseline: See chart Goal status: Met 06/11/23  5.  Pt will report decrease in daily pain to 4-5/10 or less to demonstrate improvement inmanagement of chronic condition. Baseline: 7/10 Goal status:Not Met due to pain being chronic 06/11/23     PLAN:  PT FREQUENCY: 1x/week  PT DURATION: other: 12 weeks Will be delay of 6 weeks to initiate due to pending bariatric surgery scheduled 04/21/23  PLANNED INTERVENTIONS: Therapeutic exercises, Therapeutic activity, Neuromuscular re-education, Balance training, Gait training, Patient/Family education, Self Care, Joint mobilization, Joint manipulation, Stair training, Orthotic/Fit training, DME instructions, Aquatic Therapy, Dry Needling, Cryotherapy, Moist heat, Taping, Manual therapy, and Re-evaluation.  PLAN FOR NEXT SESSION: Aquatics: Core and LE strengthening. Stretching/ROM lumbosacral area. Progress and add to HEP for pt to complete indep at [facility]) Yasmeen Manka MPT 06/11/23 9:46 AM [facility] [address] [place], [place], [address] Phone: [phone]   Fax:  [phone]

## 2023-06-12 ENCOUNTER — Ambulatory Visit (HOSPITAL_BASED_OUTPATIENT_CLINIC_OR_DEPARTMENT_OTHER): Payer: Medicare Other | Admitting: Physical Therapy

## 2023-06-19 ENCOUNTER — Ambulatory Visit (HOSPITAL_BASED_OUTPATIENT_CLINIC_OR_DEPARTMENT_OTHER): Payer: Medicare Other | Admitting: Physical Therapy

## 2023-06-24 DIAGNOSIS — G500 Trigeminal neuralgia: Secondary | ICD-10-CM | POA: Diagnosis not present

## 2023-06-24 DIAGNOSIS — G5131 Clonic hemifacial spasm, right: Secondary | ICD-10-CM | POA: Diagnosis not present

## 2023-06-24 DIAGNOSIS — G43719 Chronic migraine without aura, intractable, without status migrainosus: Secondary | ICD-10-CM | POA: Diagnosis not present

## 2023-06-26 ENCOUNTER — Ambulatory Visit (HOSPITAL_BASED_OUTPATIENT_CLINIC_OR_DEPARTMENT_OTHER): Payer: Medicare Other | Admitting: Physical Therapy

## 2023-07-01 ENCOUNTER — Other Ambulatory Visit: Payer: Self-pay

## 2023-07-01 ENCOUNTER — Ambulatory Visit (HOSPITAL_BASED_OUTPATIENT_CLINIC_OR_DEPARTMENT_OTHER): Payer: Medicare Other | Admitting: Physical Therapy

## 2023-07-04 ENCOUNTER — Other Ambulatory Visit: Payer: Self-pay

## 2023-07-28 ENCOUNTER — Other Ambulatory Visit: Payer: Self-pay

## 2023-07-28 MED ORDER — HYDROCODONE-ACETAMINOPHEN 10-325 MG PO TABS
1.0000 | ORAL_TABLET | Freq: Four times a day (QID) | ORAL | 0 refills | Status: AC | PRN
  Filled 2023-08-28 – 2023-08-29 (×3): qty 120, 30d supply, fill #0

## 2023-07-31 ENCOUNTER — Other Ambulatory Visit: Payer: Self-pay

## 2023-07-31 MED ORDER — HYDROCODONE-ACETAMINOPHEN 10-325 MG PO TABS
1.0000 | ORAL_TABLET | Freq: Four times a day (QID) | ORAL | 0 refills | Status: DC | PRN
  Filled 2023-08-01: qty 120, 30d supply, fill #0

## 2023-08-01 ENCOUNTER — Other Ambulatory Visit: Payer: Self-pay

## 2023-08-05 ENCOUNTER — Other Ambulatory Visit: Payer: Self-pay

## 2023-08-28 ENCOUNTER — Other Ambulatory Visit: Payer: Self-pay

## 2023-08-29 ENCOUNTER — Other Ambulatory Visit: Payer: Self-pay

## 2023-08-29 MED ORDER — HYDROCODONE-ACETAMINOPHEN 10-325 MG PO TABS
1.0000 | ORAL_TABLET | Freq: Four times a day (QID) | ORAL | 0 refills | Status: DC | PRN
  Filled 2023-09-27: qty 120, 30d supply, fill #0

## 2023-08-29 MED ORDER — HYDROCODONE-ACETAMINOPHEN 10-325 MG PO TABS
1.0000 | ORAL_TABLET | Freq: Four times a day (QID) | ORAL | 0 refills | Status: AC | PRN
  Filled 2023-10-27: qty 120, 30d supply, fill #0

## 2023-09-17 ENCOUNTER — Encounter: Payer: Self-pay | Admitting: Physical Therapy

## 2023-09-27 ENCOUNTER — Other Ambulatory Visit: Payer: Self-pay

## 2023-09-29 ENCOUNTER — Other Ambulatory Visit: Payer: Self-pay

## 2023-10-23 ENCOUNTER — Other Ambulatory Visit: Payer: Self-pay

## 2023-10-23 MED ORDER — MELOXICAM 15 MG PO TABS
15.0000 mg | ORAL_TABLET | Freq: Every day | ORAL | 0 refills | Status: DC
  Filled 2023-10-23: qty 30, 30d supply, fill #0

## 2023-10-23 MED ORDER — HYDROCODONE-ACETAMINOPHEN 10-325 MG PO TABS
1.0000 | ORAL_TABLET | Freq: Four times a day (QID) | ORAL | 0 refills | Status: AC | PRN
  Filled 2023-11-22 – 2023-11-26 (×4): qty 120, 30d supply, fill #0

## 2023-10-27 ENCOUNTER — Other Ambulatory Visit: Payer: Self-pay

## 2023-11-22 ENCOUNTER — Other Ambulatory Visit (HOSPITAL_BASED_OUTPATIENT_CLINIC_OR_DEPARTMENT_OTHER): Payer: Self-pay

## 2023-11-22 ENCOUNTER — Other Ambulatory Visit: Payer: Self-pay

## 2023-11-25 ENCOUNTER — Other Ambulatory Visit: Payer: Self-pay

## 2023-11-26 ENCOUNTER — Other Ambulatory Visit: Payer: Self-pay

## 2023-11-27 ENCOUNTER — Other Ambulatory Visit: Payer: Self-pay

## 2023-12-01 ENCOUNTER — Other Ambulatory Visit: Payer: Self-pay

## 2023-12-01 MED ORDER — MELOXICAM 15 MG PO TABS
15.0000 mg | ORAL_TABLET | Freq: Every day | ORAL | 2 refills | Status: AC
  Filled 2023-12-01: qty 30, 30d supply, fill #0
  Filled 2023-12-31: qty 30, 30d supply, fill #1

## 2023-12-02 ENCOUNTER — Other Ambulatory Visit: Payer: Self-pay

## 2023-12-23 ENCOUNTER — Other Ambulatory Visit: Payer: Self-pay

## 2023-12-23 MED ORDER — HYDROCODONE-ACETAMINOPHEN 10-325 MG PO TABS
1.0000 | ORAL_TABLET | Freq: Four times a day (QID) | ORAL | 0 refills | Status: AC | PRN
  Filled 2024-01-26: qty 120, 30d supply, fill #0

## 2023-12-23 MED ORDER — HYDROCODONE-ACETAMINOPHEN 10-325 MG PO TABS
1.0000 | ORAL_TABLET | Freq: Four times a day (QID) | ORAL | 0 refills | Status: AC | PRN
Start: 2024-02-24 — End: ?
  Filled 2024-02-24: qty 120, 30d supply, fill #0

## 2023-12-23 MED ORDER — HYDROCODONE-ACETAMINOPHEN 10-325 MG PO TABS
1.0000 | ORAL_TABLET | Freq: Four times a day (QID) | ORAL | 0 refills | Status: AC | PRN
  Filled 2023-12-26: qty 120, 30d supply, fill #0

## 2023-12-25 ENCOUNTER — Other Ambulatory Visit: Payer: Self-pay

## 2023-12-26 ENCOUNTER — Other Ambulatory Visit: Payer: Self-pay

## 2023-12-31 ENCOUNTER — Other Ambulatory Visit: Payer: Self-pay

## 2023-12-31 MED ORDER — MELOXICAM 15 MG PO TABS
15.0000 mg | ORAL_TABLET | Freq: Every day | ORAL | 2 refills | Status: AC
  Filled 2023-12-31 – 2024-01-30 (×2): qty 30, 30d supply, fill #0
  Filled 2024-02-23: qty 30, 30d supply, fill #1
  Filled 2024-05-17: qty 30, 30d supply, fill #2

## 2024-01-22 ENCOUNTER — Other Ambulatory Visit: Payer: Self-pay

## 2024-01-23 ENCOUNTER — Other Ambulatory Visit: Payer: Self-pay

## 2024-01-25 ENCOUNTER — Other Ambulatory Visit: Payer: Self-pay

## 2024-01-26 ENCOUNTER — Other Ambulatory Visit: Payer: Self-pay

## 2024-01-30 ENCOUNTER — Other Ambulatory Visit: Payer: Self-pay

## 2024-02-05 NOTE — Progress Notes (Signed)
 Rheumatology at [facility] Date of Service: 02/05/2024    Patient Name: Phyllis Calhoun Patient DOB: June 27, 1976  Vital Signs BP (!) 149/93 (BP Location: Left arm, Patient Position: Sitting)   Pulse 80   Ht 1.651 m (5' 5)   Wt (!) 186 kg (410 lb 12.8 oz)   SpO2 98%   BMI 68.36 kg/m   Chief Complaint/HPI Phyllis Calhoun is here for new patient consultation for joint pain, referred by [employer], MD  Pt c/o diffuse arthralgia and myalgia for over 10 years. Elevated ESR=46, CRP 8. She takes hydrocodone  and Lyrica and medications are only taking an edge off her pain. Pt with severe obesity, today's weight 410 lbs. No hot, swollen joints. No skin rashes.  No mucosal ulcers.  Pt is also being treated for headaches History of trigeminal neuralgia and surgery for it. 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  Hx of cancers: no   Physical Exam, New Patient Pt is alert and oriented.  Answers questions appropriately  Eyes: pupils reactive to light. There is no conjunctivitis. Mouth: moist moth mucosa  Neck: no abnormal masses visible. Heart: normal S1, S2. No S3, no thrills. No JVD. Lungs: no dyspnea observed, normal breath sounds on auscultation.  Extremities: no cyanosis, no edema, no calves tenderness. Skin: warm, dry.  Musculoskeletal exam:  No hot, swollen joints. Severe obesity.  Assessment 1. Arthralgia, unspecified joint      Pt with symptoms of fibromyalgia/chronic pain disorder. She has osteoarthritis in weight carrying joints. She does not have symptoms worrisome of a connective tissue disorder. Mild elevation of ESR and CRP is caused by her weight. Pt advised to continue pain medications.  I have personally spent 30 minutes involved in face-to-face and non-face-to-face activities for this patient on the day of the visit.   Professional time spent includes face-to-face meeting with the patient, documentation of clinical  information in patient's records,    PMH Medical History[1]  PSH Surgical History[2]  Allergies Patient has no known allergies.  Medications Medications Ordered Prior to Encounter[3]  Family Hx Family History[4]  Social Hx  Social History[5]   International aid/development worker. I Devere Marcine Shams, RMA am acting as scribe for Dr. Aldona ziolkowska, 02/05/2024, 2:35 PM        [1] Past Medical History: Diagnosis Date  . Headache(784.0)   . Obesity   . Prior ectopic pregnancy in first trimester, antepartum (CMD)   . Trigeminal neuralgia pain   [2] Past Surgical History: Procedure Laterality Date  . CHOLECYSTECTOMY     Procedure: CHOLECYSTECTOMY  . HYSTERECTOMY      Procedure: HYSTERECTOMY  . REDUCTION MAMMAPLASTY     Procedure: REDUCTION MAMMAPLASTY  . TUBAL LIGATION     Procedure: TUBAL LIGATION  [3] Current Outpatient Medications on File Prior to Visit  Medication Sig Dispense Refill  . acetaminophen  (TYLENOL ) 500 mg tablet Take 500 mg by mouth as needed (pain).    SABRA buPROPion (WELLBUTRIN SR) 150 mg 12 hr tablet     . gabapentin  (NEURONTIN ) 300 mg capsule     . HYDROcodone -acetaminophen  (NORCO) 10-325 mg per tablet     . meloxicam  (MOBIC ) 15 mg tablet     . ondansetron  (ZOFRAN -ODT) 4 mg disintegrating tablet     . pregabalin (LYRICA) 100 mg capsule Take 100 mg by mouth.    . PriLOSEC OTC 20 mg EC tablet Take 20 mg by mouth Once Daily.    . promethazine  (PHENERGAN ) 12.5 mg tablet  Take 1-2 every 6-8 hours for severe nausea/headache; will make sleepy 30 tablet 6  . SUMAtriptan  (IMITREX ) 100 mg tablet Take one at onset of severe headache, may repeat once in 2 hours but no more than 2 doses in 24 hours or 4/week 12 tablet 6  . topiramate  (TOPAMAX ) 100 mg tablet     . traZODone  (DESYREL ) 100 mg tablet      No current facility-administered medications on file prior to visit.  [4] Family History Problem Relation Name Age of Onset  . Diabetes Father    [5] Social  History Tobacco Use  . Smoking status: Never  . Smokeless tobacco: Never  Substance Use Topics  . Alcohol use: No  . Drug use: No

## 2024-02-18 ENCOUNTER — Emergency Department (HOSPITAL_BASED_OUTPATIENT_CLINIC_OR_DEPARTMENT_OTHER)

## 2024-02-18 ENCOUNTER — Emergency Department (HOSPITAL_BASED_OUTPATIENT_CLINIC_OR_DEPARTMENT_OTHER)
Admission: EM | Admit: 2024-02-18 | Discharge: 2024-02-18 | Disposition: A | Discharge status: Home / Self Care | Source: Skilled Nursing Facility

## 2024-02-18 ENCOUNTER — Encounter (HOSPITAL_BASED_OUTPATIENT_CLINIC_OR_DEPARTMENT_OTHER): Payer: Self-pay

## 2024-02-18 ENCOUNTER — Other Ambulatory Visit: Payer: Self-pay

## 2024-02-18 DIAGNOSIS — L03115 Cellulitis of right lower limb: Secondary | ICD-10-CM | POA: Diagnosis not present

## 2024-02-18 DIAGNOSIS — M7989 Other specified soft tissue disorders: Secondary | ICD-10-CM | POA: Diagnosis present

## 2024-02-18 LAB — CBC WITH DIFFERENTIAL/PLATELET
Abs Immature Granulocytes: 0.04 K/uL (ref 0.00–0.07)
Basophils Absolute: 0 K/uL (ref 0.0–0.1)
Basophils Relative: 1 %
Eosinophils Absolute: 0.2 K/uL (ref 0.0–0.5)
Eosinophils Relative: 2 %
HCT: 40.3 % (ref 36.0–46.0)
Hemoglobin: 12.5 g/dL (ref 12.0–15.0)
Immature Granulocytes: 1 %
Lymphocytes Relative: 28 %
Lymphs Abs: 2.3 K/uL (ref 0.7–4.0)
MCH: 27.7 pg (ref 26.0–34.0)
MCHC: 31 g/dL (ref 30.0–36.0)
MCV: 89.2 fL (ref 80.0–100.0)
Monocytes Absolute: 0.6 K/uL (ref 0.1–1.0)
Monocytes Relative: 7 %
Neutro Abs: 5.1 K/uL (ref 1.7–7.7)
Neutrophils Relative %: 61 %
Platelets: 256 K/uL (ref 150–400)
RBC: 4.52 MIL/uL (ref 3.87–5.11)
RDW: 15 % (ref 11.5–15.5)
WBC: 8.3 K/uL (ref 4.0–10.5)
nRBC: 0 % (ref 0.0–0.2)

## 2024-02-18 LAB — BASIC METABOLIC PANEL WITH GFR
Anion gap: 10 (ref 5–15)
BUN: 17 mg/dL (ref 6–20)
CO2: 25 mmol/L (ref 22–32)
Calcium: 9.6 mg/dL (ref 8.9–10.3)
Chloride: 109 mmol/L (ref 98–111)
Creatinine, Ser: 0.89 mg/dL (ref 0.44–1.00)
GFR, Estimated: >60 mL/min (ref >60)
Glucose, Bld: 83 mg/dL (ref 70–99)
Potassium: 4.6 mmol/L (ref 3.5–5.1)
Sodium: 143 mmol/L (ref 135–145)

## 2024-02-18 LAB — TROPONIN T, HIGH SENSITIVITY: Troponin T High Sensitivity: 9 ng/L (ref <19)

## 2024-02-18 MED ORDER — KETOROLAC TROMETHAMINE 15 MG/ML IJ SOLN
15.0000 mg | Freq: Once | INTRAMUSCULAR | Status: DC
  Filled 2024-02-18: qty 1

## 2024-02-18 MED ORDER — KETOROLAC TROMETHAMINE 15 MG/ML IJ SOLN
15.0000 mg | Freq: Once | INTRAMUSCULAR | Status: AC
  Administered 2024-02-18: 15 mg via INTRAVENOUS

## 2024-02-18 MED ORDER — CEPHALEXIN 500 MG PO CAPS: 500.0000 mg | ORAL_CAPSULE | Freq: Four times a day (QID) | ORAL | 0 refills | Status: AC

## 2024-02-18 NOTE — ED Notes (Signed)
 Pt advised she was told to come in for an US  of her RLL. Uses rolling walker to get around typically, no issues bearing weight but pain with ROM. Also complained of chronic R knee pain.

## 2024-02-18 NOTE — Discharge Instructions (Addendum)
 Take your antibiotics as prescribed.  You can use Tylenol  and Motrin  as needed for pain.  Call and follow-up with your primary care doctor.  Return to the ER if your symptoms worsen.

## 2024-02-18 NOTE — ED Provider Notes (Signed)
 [facility] Provider Note   CSN: [id number] Arrival date & time: 02/18/24  1121     Patient presents with: Chest Pain   Phyllis Calhoun is a 48 y.o. female.   48 year old female presents for evaluation of lower extremity swelling.  States she was sent here by her primary care doctor for ultrasound to rule out a blood clot in her right lower extremity.  Noticed has been more swollen and she has had some pain in the right calf region.  Does have a history of blood clots.  States she had a DVT after previous surgery for which she was on blood thinners for but she is no longer on them.  She admits to some chest pain shortness of breath that she states comes and goes.  Denies any other symptoms or concerns at this time.   Chest Pain Associated symptoms: no abdominal pain, no back pain, no cough, no fever, no palpitations, no shortness of breath and no vomiting        Prior to Admission medications   Medication Sig Start Date End Date Taking? Authorizing Provider  cephALEXin  (KEFLEX ) 500 MG capsule Take 1 capsule (500 mg total) by mouth 4 (four) times daily for 7 days. 02/18/24 02/25/24 Yes Alvia Jablonski L, DO  acetaminophen  (TYLENOL ) 500 MG tablet Take 1,000 mg by mouth every 6 (six) hours as needed for pain. Pain.    [provider]  amoxicillin -clavulanate (AUGMENTIN ) 875-125 MG tablet Take 1 tablet by mouth every 12 (twelve) hours. 03/23/23   Rancour, Garnette, MD  diclofenac sodium (VOLTAREN) 1 % GEL APPLY 4 GRAMS 2 TO 3 TIMES DAILY AS NEEDED 10/28/18   [provider]  Fremanezumab -vfrm (AJOVY ) 225 MG/1.5ML SOSY Inject 225 mg into the skin every 30 (thirty) days. 11/04/18   Ines Onetha NOVAK, MD  gabapentin  (NEURONTIN ) 100 MG capsule Take 1 capsule (100 mg total) by mouth 3 (three) times daily. 06/02/19   Lomax, Amy, NP  guaiFENesin  (MUCINEX ) 600 MG 12 hr tablet Take 1 tablet (600 mg total) by mouth 2 (two) times daily.  03/23/23   Rancour, Garnette, MD  HYDROcodone -acetaminophen  (NORCO) 10-325 MG tablet Take 1 tablet by mouth every 6 (six) hours as needed for moderate pain.    [provider]  HYDROcodone -acetaminophen  (NORCO) 10-325 MG tablet Take one tablet by mouth every 6 (six) hours as needed for Pain for up to 30 days. Max Daily Amount: 4 tablets 05/16/22     HYDROcodone -acetaminophen  (NORCO) 10-325 MG tablet Take one tablet by mouth every 6 (six) hours as needed for pain for up to 30 days. (max 4 tablets per day) 08/16/22     HYDROcodone -acetaminophen  (NORCO) 10-325 MG tablet Take one tablet by mouth every 6 (six) hours as needed for Pain for up to 30 days. DNF 07/17/22 Max Daily Amount: 4 tablets 07/17/22     HYDROcodone -acetaminophen  (NORCO) 10-325 MG tablet Take one tablet by mouth every 6 (six) hours as needed for Pain for up to 30 days. Max Daily Amount: 4 tablets 10/16/22     HYDROcodone -acetaminophen  (NORCO) 10-325 MG tablet Take 1 tablet by mouth every 6 (six) hours as needed for pain for up to 30 days. Max Daily Amount: 4 tablets 03/11/23     HYDROcodone -acetaminophen  (NORCO) 10-325 MG tablet Take 1 tablet by mouth every 6 (six) hours as needed for pain for up to 30 days. Max Daily Amount: 4 tablets 02/09/23     HYDROcodone -acetaminophen  (NORCO) 10-325 MG  tablet Take one tablet by mouth every 6 (six) hours as needed for Pain for up to 30 days. DNF 02/05/23 Max Daily Amount: 4 tablets 02/05/23     HYDROcodone -acetaminophen  (NORCO) 10-325 MG tablet Take one tablet by mouth every 6 (six) hours as needed for Pain for up to 30 days. DNF 06/30/23 Max Daily Amount: 4 tablets 06/30/23     HYDROcodone -acetaminophen  (NORCO) 10-325 MG tablet Take 1 tablet by mouth every 6 (six) hours as needed for pain for up to 30 days. Max Daily Amount: 4 tablets 05/02/23     HYDROcodone -acetaminophen  (NORCO) 10-325 MG tablet Take one tablet by mouth every 6 (six) hours as needed for pain for up to 30 days. 05/31/23      HYDROcodone -acetaminophen  (NORCO) 10-325 MG tablet Take 1 tablet by mouth every 6 (six) hours as needed for pain for up to 30 days. Max Daily Amount: 4 tablets 08/03/23     HYDROcodone -acetaminophen  (NORCO) 10-325 MG tablet Take 1 tablet by mouth every 6 (six) hours as needed for pain for up to 30 days. Max of 4 tablets a day. 10/29/23     HYDROcodone -acetaminophen  (NORCO) 10-325 MG tablet Take one tablet by mouth every 6 (six) hours as needed for Pain for up to 30 days. FD 02/24/24 Max Daily Amount: 4 tablets 02/24/24     HYDROcodone -acetaminophen  (NORCO) 10-325 MG tablet Take one tablet by mouth every 6 (six) hours as needed for Pain for up to 30 days. FD 12/26/23 Max Daily Amount: 4 tablets 12/26/23     HYDROcodone -acetaminophen  (NORCO) 10-325 MG tablet Take one tablet by mouth every 6 (six) hours as needed for Pain for up to 30 days. FD 01/25/24 Max Daily Amount: 4 tablets 01/25/24     meloxicam  (MOBIC ) 15 MG tablet Take 1 tablet (15 mg total) by mouth daily. 10/13/21   Elnor Hila P, DO  meloxicam  (MOBIC ) 15 MG tablet Take 1 tablet (15 mg total) by mouth daily. 09/13/22     meloxicam  (MOBIC ) 15 MG tablet Take 1 tablet (15 mg total) by mouth daily. 04/28/23     meloxicam  (MOBIC ) 15 MG tablet Take 1 tablet (15 mg total) by mouth daily. 12/01/23     meloxicam  (MOBIC ) 15 MG tablet Take 1 tablet (15 mg total) by mouth daily. 12/31/23     methocarbamol  (ROBAXIN ) 750 MG tablet Take 750 mg by mouth every 8 (eight) hours as needed for muscle spasms. 10/16/16   [provider]  ondansetron  (ZOFRAN  ODT) 4 MG disintegrating tablet Take 1 tablet (4 mg total) by mouth every 8 (eight) hours as needed for nausea or vomiting. 01/18/20   Adah Corning A, FNP  phentermine (ADIPEX-P) 37.5 MG tablet Take 37.5 mg by mouth daily. 10/25/18   [provider]  Semaglutide -Weight Management (WEGOVY ) 0.25 MG/0.5ML SOAJ Inject 0.5 mLs (0.25 mg dose) into the skin once a week for 28 days. 10/16/22     Semaglutide -Weight  Management (WEGOVY ) 0.5 MG/0.5ML SOAJ Inject 0.5 mg into the skin once a week for 28 days. 10/16/22     SUMAtriptan  (IMITREX ) 100 MG tablet Take 100 mg by mouth every 2 (two) hours as needed for migraine or headache. May repeat in 2 hours if headache persists or recurs.    [provider]  tiZANidine  (ZANAFLEX ) 4 MG tablet Take 1 tablet (4 mg total) by mouth every 8 (eight) hours as needed. 06/24/20   Christopher Savannah, PA-C  topiramate  (TOPAMAX ) 100 MG tablet Take 1 tablet (100 mg total) by  mouth 2 (two) times daily. 01/19/20   Ines Onetha NOVAK, MD  Vitamin D, Ergocalciferol, (DRISDOL) 1.25 MG (50000 UT) CAPS capsule Take 50,000 Units by mouth once a week. 09/14/18   [provider]    Allergies: Patient has no known allergies.    Review of Systems  Constitutional:  Negative for chills and fever.  HENT:  Negative for ear pain and sore throat.   Eyes:  Negative for pain and visual disturbance.  Respiratory:  Negative for cough and shortness of breath.   Cardiovascular:  Positive for chest pain and leg swelling. Negative for palpitations.  Gastrointestinal:  Negative for abdominal pain and vomiting.  Genitourinary:  Negative for dysuria and hematuria.  Musculoskeletal:  Negative for arthralgias and back pain.  Skin:  Negative for color change and rash.  Neurological:  Negative for seizures and syncope.  All other systems reviewed and are negative.   Updated Vital Signs BP (!) 164/77 (BP Location: Left Arm)   Pulse 88   Temp 97.6 F (36.4 C) (Oral)   Resp 18   Ht 5' 7 (1.702 m)   Wt (!) 181 kg   SpO2 97%   BMI 62.49 kg/m   Physical Exam Vitals and nursing note reviewed.  Constitutional:      General: She is not in acute distress.    Appearance: She is well-developed. She is not ill-appearing.  HENT:     Head: Normocephalic and atraumatic.  Eyes:     Conjunctiva/sclera: Conjunctivae normal.  Cardiovascular:     Rate and Rhythm: Normal rate and regular rhythm.      Heart sounds: No murmur heard. Pulmonary:     Effort: Pulmonary effort is normal. No respiratory distress.     Breath sounds: Normal breath sounds.  Abdominal:     Palpations: Abdomen is soft.     Tenderness: There is no abdominal tenderness.  Musculoskeletal:        General: No swelling.     Cervical back: Neck supple.     Comments: Mild swelling of right lower extremity with some erythema over the popliteal fossa, mild tenderness to palpation  Skin:    General: Skin is warm and dry.     Capillary Refill: Capillary refill takes less than 2 seconds.  Neurological:     Mental Status: She is alert.  Psychiatric:        Mood and Affect: Mood normal.     (all labs ordered are listed, but only abnormal results are displayed) Labs Reviewed  BASIC METABOLIC PANEL WITH GFR  CBC WITH DIFFERENTIAL/PLATELET  TROPONIN T, HIGH SENSITIVITY  TROPONIN T, HIGH SENSITIVITY    EKG: EKG Interpretation Date/Time:  Wednesday February 18 2024 11:41:35 EDT Ventricular Rate:  83 PR Interval:  157 QRS Duration:  88 QT Interval:  370 QTC Calculation: 435 R Axis:   31  Text Interpretation: Sinus rhythm Low voltage, precordial leads Borderline T abnormalities, diffuse leads Borderline ST elevation, lateral leads Confirmed by Towana Sharper [phone]) on 02/18/2024 11:43:03 AM  Radiology: US  Venous Img Lower Unilateral Right Result Date: 02/18/2024 CLINICAL DATA:  Pain, redness, swelling EXAM: RIGHT LOWER EXTREMITY VENOUS DOPPLER ULTRASOUND TECHNIQUE: Gray-scale sonography with compression, as well as color and duplex ultrasound, were performed to evaluate the deep venous system(s) from the level of the common femoral vein through the popliteal and proximal calf veins. COMPARISON:  None FINDINGS: VENOUS Normal compressibility of the common femoral, visualized portions of superficial femoral vein, as well as the visualized calf  veins. Peroneal vein not visualized. Limited visualization of the mid and distal  femoral vein, and distal popliteal vein. Visualized portions of profunda femoral vein and great saphenous vein unremarkable. No filling defects to suggest DVT on grayscale or color Doppler imaging. Doppler waveforms show normal direction of venous flow, normal respiratory plasticity and response to augmentation. Limited views of the contralateral common femoral vein are unremarkable. OTHER None. Limitations: Technologist describes technically difficult study, presumably secondary to body habitus. IMPRESSION: Negative, with limitations as above. Electronically Signed   By: JONETTA Faes M.D.   On: 02/18/2024 12:59   DG Chest Port 1 View Result Date: 02/18/2024 CLINICAL DATA:  355200 Chest pain 355200 EXAM: PORTABLE CHEST - 1 VIEW COMPARISON:  March 23, 2023 FINDINGS: Elevation of the right hemidiaphragm. No focal airspace consolidation, pleural effusion, or pneumothorax. No cardiomegaly. No acute fracture or destructive lesion. IMPRESSION: No acute cardiopulmonary abnormality. Electronically Signed   By: Rogelia Myers M.D.   On: 02/18/2024 12:37     Procedures   Medications Ordered in the ED  ketorolac  (TORADOL ) 15 MG/ML injection 15 mg (15 mg Intravenous Given 02/18/24 1344)                                    Medical Decision Making Medical Decision Making Nursing notes are reviewed. Differential diagnosis for this patient would include but not limited to: DVT, cellulitis, lymphedema, contusion, other  Emergency Department Course:  Vital signs and pulse oximetry are reviewed, evaluated by myself and found to be within normal limits prior to final disposition. Findings of laboratory testing and medical imaging are discussed with patient and family that is available. Patient agrees with the medical care plan as follows:  Patient with no evidence of DVT, she does have some erythema and swelling of the popliteal fossa of her leg and I will treat her for cellulitis with Keflex .  Was given  Toradol  here and advised Tylenol  Motrin  as needed for pain.  Vies close follow-up with her primary care doctor and otherwise return to the ER for any worsening symptoms.  She feels comfortable being discharged home.  All results and plan were discussed with patient and family at bedside.  Problems Addressed: Cellulitis of right lower extremity: acute illness or injury Leg swelling: acute illness or injury  Amount and/or Complexity of Data Reviewed External Data Reviewed: notes.    Details: Outpatient records reviewed and patient was sent here to rule out DVT with ultrasound Labs: ordered. Decision-making details documented in ED Course.    Details: Ordered and reviewed by me and are fairly unremarkable Radiology: ordered and independent interpretation performed. Decision-making details documented in ED Course.    Details: Ordered and interpreted by me independently of radiology Chest x-ray: Shows no acute abnormality in the chest  Right lower extremity ultrasound read by radiology and reviewed by me and shows no evidence of DVT ECG/medicine tests: ordered and independent interpretation performed. Decision-making details documented in ED Course.    Details: Ordered and interpreted independently of cardiology and shows no acute abnormality or acute change when compared to prior, no STEMI  Risk OTC drugs. Prescription drug management.     Final diagnoses:  Cellulitis of right lower extremity  Leg swelling    ED Discharge Orders          Ordered    cephALEXin  (KEFLEX ) 500 MG capsule  4 times daily  02/18/24 1332               Phyllis Calhoun, Duwaine L, DO 02/18/24 1355

## 2024-02-18 NOTE — ED Triage Notes (Signed)
 Pt presents with complaints of right leg pain, redness & swelling X 3 days . Seen by rheumatologist who had concerns  for a blood clot. Also endorses SOB & chest pain.

## 2024-02-18 NOTE — ED Notes (Signed)

## 2024-02-23 ENCOUNTER — Other Ambulatory Visit: Payer: Self-pay

## 2024-02-24 ENCOUNTER — Other Ambulatory Visit: Payer: Self-pay

## 2024-03-24 ENCOUNTER — Other Ambulatory Visit: Payer: Self-pay

## 2024-03-24 MED ORDER — HYDROCODONE-ACETAMINOPHEN 10-325 MG PO TABS
1.0000 | ORAL_TABLET | Freq: Four times a day (QID) | ORAL | 0 refills | Status: AC | PRN
  Filled 2024-03-25: qty 120, 30d supply, fill #0

## 2024-03-24 MED ORDER — HYDROCODONE-ACETAMINOPHEN 10-325 MG PO TABS: 1.0000 | ORAL_TABLET | Freq: Four times a day (QID) | ORAL | 0 refills | Status: AC | PRN

## 2024-03-24 MED ORDER — MELOXICAM 15 MG PO TABS
15.0000 mg | ORAL_TABLET | Freq: Every day | ORAL | 2 refills | Status: AC
  Filled 2024-03-24: qty 30, 30d supply, fill #0
  Filled 2024-08-14 – 2024-08-16 (×2): qty 30, 30d supply, fill #1
  Filled 2024-09-08: qty 30, 30d supply, fill #2

## 2024-03-24 MED ORDER — METHOCARBAMOL 750 MG PO TABS
750.0000 mg | ORAL_TABLET | Freq: Three times a day (TID) | ORAL | 2 refills | Status: AC
  Filled 2024-03-24 – 2024-07-14 (×2): qty 90, 30d supply, fill #0
  Filled 2024-08-14 – 2024-08-16 (×2): qty 90, 30d supply, fill #1
  Filled 2024-09-08: qty 90, 30d supply, fill #2

## 2024-03-25 ENCOUNTER — Other Ambulatory Visit: Payer: Self-pay

## 2024-04-21 ENCOUNTER — Other Ambulatory Visit: Payer: Self-pay

## 2024-04-21 MED ORDER — METHOCARBAMOL 750 MG PO TABS
750.0000 mg | ORAL_TABLET | Freq: Three times a day (TID) | ORAL | 2 refills | Status: AC
  Filled 2024-04-21: qty 90, 30d supply, fill #0
  Filled 2024-05-17: qty 90, 30d supply, fill #1
  Filled 2024-06-15: qty 90, 30d supply, fill #2

## 2024-04-21 MED ORDER — MELOXICAM 15 MG PO TABS
15.0000 mg | ORAL_TABLET | Freq: Every day | ORAL | 2 refills | Status: AC
  Filled 2024-04-21: qty 30, 30d supply, fill #0
  Filled 2024-06-15: qty 30, 30d supply, fill #1
  Filled 2024-07-14: qty 30, 30d supply, fill #2

## 2024-04-21 MED ORDER — HYDROCODONE-ACETAMINOPHEN 10-325 MG PO TABS: 1.0000 | ORAL_TABLET | Freq: Four times a day (QID) | ORAL | 0 refills | Status: AC | PRN

## 2024-04-23 ENCOUNTER — Other Ambulatory Visit: Payer: Self-pay

## 2024-04-23 MED ORDER — HYDROCODONE-ACETAMINOPHEN 10-325 MG PO TABS
1.0000 | ORAL_TABLET | Freq: Four times a day (QID) | ORAL | 0 refills | Status: AC | PRN
  Filled 2024-04-23: qty 120, 30d supply, fill #0

## 2024-04-30 ENCOUNTER — Other Ambulatory Visit: Payer: Self-pay

## 2024-05-17 ENCOUNTER — Other Ambulatory Visit: Payer: Self-pay

## 2024-05-18 ENCOUNTER — Other Ambulatory Visit: Payer: Self-pay

## 2024-05-18 MED ORDER — HYDROCODONE-ACETAMINOPHEN 10-325 MG PO TABS
1.0000 | ORAL_TABLET | Freq: Four times a day (QID) | ORAL | 0 refills | Status: AC | PRN
  Filled 2024-05-24: qty 120, 30d supply, fill #0

## 2024-05-21 ENCOUNTER — Other Ambulatory Visit: Payer: Self-pay

## 2024-05-24 ENCOUNTER — Other Ambulatory Visit: Payer: Self-pay

## 2024-06-04 ENCOUNTER — Other Ambulatory Visit: Payer: Self-pay

## 2024-06-15 ENCOUNTER — Other Ambulatory Visit: Payer: Self-pay

## 2024-06-15 MED ORDER — HYDROCODONE-ACETAMINOPHEN 10-325 MG PO TABS
1.0000 | ORAL_TABLET | Freq: Four times a day (QID) | ORAL | 0 refills | Status: AC | PRN
  Filled 2024-07-23: qty 120, 30d supply, fill #0
  Filled ????-??-??: fill #0

## 2024-06-15 MED ORDER — HYDROCODONE-ACETAMINOPHEN 10-325 MG PO TABS: 1.0000 | ORAL_TABLET | Freq: Four times a day (QID) | ORAL | 0 refills | Status: AC | PRN

## 2024-06-15 MED ORDER — HYDROCODONE-ACETAMINOPHEN 10-325 MG PO TABS
1.0000 | ORAL_TABLET | Freq: Four times a day (QID) | ORAL | 0 refills | Status: DC | PRN
  Filled 2024-06-23: qty 120, 30d supply, fill #0

## 2024-06-16 ENCOUNTER — Other Ambulatory Visit: Payer: Self-pay

## 2024-06-23 ENCOUNTER — Other Ambulatory Visit: Payer: Self-pay

## 2024-07-15 ENCOUNTER — Other Ambulatory Visit: Payer: Self-pay

## 2024-07-21 ENCOUNTER — Other Ambulatory Visit: Payer: Self-pay

## 2024-07-22 ENCOUNTER — Other Ambulatory Visit: Payer: Self-pay

## 2024-07-23 ENCOUNTER — Other Ambulatory Visit: Payer: Self-pay

## 2024-07-23 ENCOUNTER — Other Ambulatory Visit (HOSPITAL_COMMUNITY): Payer: Self-pay

## 2024-07-23 ENCOUNTER — Other Ambulatory Visit (HOSPITAL_BASED_OUTPATIENT_CLINIC_OR_DEPARTMENT_OTHER): Payer: Self-pay

## 2024-07-23 MED ORDER — HYDROCODONE-ACETAMINOPHEN 10-325 MG PO TABS
1.0000 | ORAL_TABLET | Freq: Four times a day (QID) | ORAL | 0 refills | Status: AC | PRN
  Filled 2024-08-23: qty 120, 30d supply, fill #0

## 2024-07-30 ENCOUNTER — Encounter: Payer: Self-pay | Admitting: Student

## 2024-08-16 ENCOUNTER — Other Ambulatory Visit (HOSPITAL_COMMUNITY): Payer: Self-pay

## 2024-08-16 ENCOUNTER — Other Ambulatory Visit: Payer: Self-pay

## 2024-08-17 ENCOUNTER — Ambulatory Visit: Admitting: Student

## 2024-08-23 ENCOUNTER — Other Ambulatory Visit: Payer: Self-pay

## 2024-08-23 ENCOUNTER — Other Ambulatory Visit (HOSPITAL_COMMUNITY): Payer: Self-pay

## 2024-08-24 ENCOUNTER — Ambulatory Visit: Admitting: Cardiology

## 2024-08-25 NOTE — Progress Notes (Unsigned)
 " [facility] Office Note:    Date:  08/27/2024   ID:  Phyllis Calhoun, DOB [birth year], MRN [id number]  PCP:  Kip Righter, MD   [facility] Providers Cardiologist:  Darryle ONEIDA Decent, MD     Referring MD: Dartha Geralds, DO   Chief Complaint  Patient presents with   New Patient (Initial Visit)    Chest pain    History of Present Illness:    Phyllis Calhoun is a 49 y.o. female with a hx of OSA, OHS, hx of gastric sleeve surgery in 2024, trigeminal neuralgia that resulted in MVD in 2022, and polymyalgia rheumatica.   She was seen in the ER 07/22/24 for chest pain found to have an elevated D-dimer. CTA negative for PE. She ruled out wit negative CE. She was seen in follow up with PCP and reported progressive chest pain. She reported recently starting Kevzara 200 mg injections, first injection was 07/16/24.  She presents today to establish with cardiology. She recounts that in 07/23/2024 she was seen in UC. This was after prednisone course dose escalation due to polymalgia rheumatica flare. She stopped prednisone and started kevzara.   She recounts chest pain was intermittent but kept her from sleeping and activities. The CP was intermittent in waves in the right side of her chest that began to radiate to her right neck. CP x 3 weeks prior to UC visit. She ruled out with negative CE, elevated D-dimer but CTA negative for PE.   She continues to have ongoing intermittent chest pain worse with stress and anxiety attacks. CP lasts for a few minutes and spontaneously resolves with rest, she has to lay down.   She is in physical therapy for bone-on-bone in bilateral knees. She does not currently work. She uses a rollator and cane. She has a motorized scooter for long distances. Her most strenuous activity is PT. She is having CP during PT on the bike and treadmill.   She is a never smoker, no alcohol, no THC containing products, no illicit drugs.  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 She now does not work.  She lives at home with husband and her parents.   Family history: Father - HTN, prostate cancer Mother - HTN Brother - no heart issues 2 children - son has Afib, daughter is healthy   Past Medical History:  Diagnosis Date   Arthritis    both legs   Gallstones    HX OF SEVERAL GALLBLADDER ATTACKS   Headache(784.0)    PT STATES MIGRAINES FROM TRIGEMINAL NEURALGIA   Sleep apnea    sleep study confirmed   Trigeminal neuralgia    2012-PT CONTINUES TO HAVE PAIN RIGHT SIDE OF FACE    Past Surgical History:  Procedure Laterality Date   ABDOMINAL HYSTERECTOMY     BRAIN SURGERY     BREAST REDUCTION SURGERY     CHOLECYSTECTOMY N/A 05/25/2013   Procedure: LAPAROSCOPIC CHOLECYSTECTOMY WITH INTRAOPERATIVE CHOLANGIOGRAM;  Surgeon: Lynda Leos, MD;  Location: [facility];  Service: General;  Laterality: N/A;   KNEE ARTHROSCOPY Left 12/29/2015   Procedure: ARTHROSCOPY KNEE;  Surgeon: Norleen Gavel, MD;  Location: [facility] OR;  Service: Orthopedics;  Laterality: Left;   TONSILLECTOMY      Current Medications: Active Medications[1]   Allergies:   Lactose intolerance (gi)   Social History   Socioeconomic History   Marital status: Married    Spouse name: Not on file  Number of children: 2   Years of education: Not on file   Highest education level: Some college, no degree  Occupational History   Not on file  Tobacco Use   Smoking status: Never   Smokeless tobacco: Never  Vaping Use   Vaping status: Never Used  Substance and Sexual Activity   Alcohol use: No   Drug use: No   Sexual activity: Not on file  Other Topics Concern   Not on file  Social History Narrative   Lives at home with her husband, parents, and her son   Right handed   Caffeine: mostly water, tea here and there   Social Drivers of Health   Tobacco Use: Low Risk (08/27/2024)   Patient History    Smoking  Tobacco Use: Never    Smokeless Tobacco Use: Never    Passive Exposure: Not on file  Financial Resource Strain: Low Risk (08/17/2024)   Received from [facility]   Overall Financial Resource Strain (CARDIA)    How hard is it for you to pay for the very basics like food, housing, medical care, and heating?: Not hard at all  Food Insecurity: No Food Insecurity (08/17/2024)   Received [facility]   Epic    Within the past 12 months, you worried that your food would run out before you got the money to buy more.: Never true    Within the past 12 months, the food you bought just didn't last and you didn't have money to get more.: Never true  Transportation Needs: No Transportation Needs (08/17/2024)   Received from [facility]    In the past 12 months, has lack of transportation kept you from medical appointments or from getting medications?: No    In the past 12 months, has lack of transportation kept you from meetings, work, or from getting things needed for daily living?: No  Physical Activity: Insufficiently Active (12/21/2023)   Received from [facility]   Exercise Vital Sign    On average, how many days per week do you engage in moderate to strenuous exercise (like a brisk walk)?: 3 days    On average, how many minutes do you engage in exercise at this level?: 10 min  Stress: Stress Concern Present (12/21/2023)   Received from [facility] - Occupational Stress Questionnaire    Feeling of Stress : To some extent  Social Connections: Somewhat Isolated (12/21/2023)   Received from [facility]   Social Network    How would you rate your social network (family, work, friends)?: Restricted participation with some degree of social isolation  Depression (PHQ2-9): Not on file  Alcohol Screen: Not on file  Housing: Low Risk (08/17/2024)   Received from [facility]    In the last 12 months, was there a time when you were not  able to pay the mortgage or rent on time?: No    In the past 12 months, how many times have you moved where you were living?: 0    At any time in the past 12 months, were you homeless or living in a shelter (including now)?: No  Utilities: Not At Risk (08/17/2024)   [facility] from [facility]    In the past 12 months has the electric, gas, oil, or water company threatened to shut off services in your home?: No  Health Literacy: Not on file  Family History: The patient's family history is negative for Migraines.  ROS:   Please see the history of present illness.     All other systems reviewed and are negative.  EKGs/Labs/Other Studies Reviewed:    The following studies were reviewed today:  EKG Interpretation Date/Time:  Friday August 27 2024 13:59:16 EST Ventricular Rate:  79 PR Interval:  158 QRS Duration:  86 QT Interval:  382 QTC Calculation: 438 R Axis:   8  Text Interpretation: Normal sinus rhythm Normal ECG When compared with ECG of 18-Feb-2024 11:41, PREVIOUS ECG IS PRESENT Confirmed by Madie Slough ([id number]) on 08/27/2024 2:03:49 PM    Recent Labs: 02/18/2024: BUN 17; Creatinine, Ser 0.89; Hemoglobin 12.5; Platelets 256; Potassium 4.6; Sodium 143  Recent Lipid Panel No results found for: CHOL, TRIG, HDL, CHOLHDL, VLDL, LDLCALC, LDLDIRECT   Risk Assessment/Calculations:                Physical Exam:    VS:  BP 116/72   Pulse 72   Wt (!) 427 lb (193.7 kg)   BMI 66.88 kg/m     Wt Readings from Last 3 Encounters:  08/27/24 (!) 427 lb (193.7 kg)  02/18/24 (!) 399 lb (181 kg)  03/23/23 (!) 430 lb (195 kg)     GEN:  obese female in no acute distress HEENT: Normal NECK: No JVD; No carotid bruits LYMPHATICS: No lymphadenopathy CARDIAC: RRR, no murmurs, rubs, gallops RESPIRATORY:  Clear to auscultation without rales, wheezing or rhonchi  ABDOMEN: Soft, non-tender, non-distended MUSCULOSKELETAL:  No edema; No deformity  SKIN: Warm  and dry NEUROLOGIC:  Alert and oriented x 3 PSYCHIATRIC:  Normal affect   ASSESSMENT:    1. Precordial pain   2. OSA (obstructive sleep apnea)   3. Trigeminal neuralgia of right side of face   4. Super obesity   5. Hypoventilation associated with obesity syndrome (HCC)   6. Swelling of lower extremity    PLAN:    In order of problems listed above:  Chest pain - concerning for angina, worse during aquatic PT and improved with rest - however, her weight is 427 lbs with a BMI of 68.9 - she has  had a prior breast reduction, she may be a candidate for a CT coronary - will start with an echo - BP too low for most anti-anginals - will start 12.5 mg toprol at night - EKG reviewed with Dr. Barbaraann, likely no ischemic workup, but will await echo   Obesity - current weight is 427 lbs with a BMI of 69 - history of gastric sleeve - regained weight when with prednisone for polymyalgia rheumatica   Will have her follow up with Dr. Barbaraann in 5-6 weeks after echo.       Medication Adjustments/Labs and Tests Ordered: Current medicines are reviewed at length with the patient today.  Concerns regarding medicines are outlined above.  Orders Placed This Encounter  Procedures   EKG 12-Lead   ECHOCARDIOGRAM COMPLETE   Meds ordered this encounter  Medications   metoprolol succinate (TOPROL-XL) 25 MG 24 hr tablet    Sig: Take 0.5 tablets (12.5 mg total) by mouth at bedtime.    Dispense:  45 tablet    Refill:  1    Patient Instructions  Medication Instructions:  Your physician has recommended you make the following change in your medication:   START Toprol XL 25 mg taking 1/2 tablet nightly  *If you need a refill on your cardiac medications before your  next appointment, please call your pharmacy*  Lab Work: None ordered  If you have labs (blood work) drawn today and your tests are completely normal, you will receive your results only by: MyChart Message (if you have MyChart) OR A  paper copy in the mail If you have any lab test that is abnormal or we need to change your treatment, we will call you to review the results.  Testing/Procedures: Your physician has requested that you have an echocardiogram. Echocardiography is a painless test that uses sound waves to create images of your heart. It provides your doctor with information about the size and shape of your heart and how well your hearts chambers and valves are working. This procedure takes approximately one hour. There are no restrictions for this procedure. Please do NOT wear cologne, perfume, aftershave, or lotions (deodorant is allowed). Please arrive 15 minutes prior to your appointment time.  Please note: We ask at that you not bring children with you during ultrasound (echo/ vascular) testing. Due to room size and safety concerns, children are not allowed in the ultrasound rooms during exams. Our front office staff cannot provide observation of children in our lobby area while testing is being conducted. An adult accompanying a patient to their appointment will only be allowed in the ultrasound room at the discretion of the ultrasound technician under special circumstances. We apologize for any inconvenience.   Follow-Up: At [facility], you and your health needs are our priority.  As part of our continuing mission to provide you with exceptional heart care, our providers are all part of one team.  This team includes your primary Cardiologist (physician) and Advanced Practice Providers or APPs (Physician Assistants and Nurse Practitioners) who all work together to provide you with the care you need, when you need it.  Your next appointment:   6 week(s)  Provider:   Darryle ONEIDA Decent, MD    We recommend signing up for the patient portal called MyChart.  Sign up information is provided on this After Visit Summary.  MyChart is used to connect with patients for Virtual Visits (Telemedicine).  Patients  are able to view lab/test results, encounter notes, upcoming appointments, etc.  Non-urgent messages can be sent to your provider as well.   To learn more about what you can do with MyChart, go to [email or web address].   Other Instructions              Signed, Jon Nat Hails, [place]  08/27/2024 3:42 PM    [facility]     [1]  Current Meds  Medication Sig   metoprolol succinate (TOPROL-XL) 25 MG 24 hr tablet Take 0.5 tablets (12.5 mg total) by mouth at bedtime.   "

## 2024-08-27 ENCOUNTER — Encounter: Payer: Self-pay | Admitting: Physician Assistant

## 2024-08-27 ENCOUNTER — Ambulatory Visit: Attending: Physician Assistant | Admitting: Physician Assistant

## 2024-08-27 VITALS — BP 116/72 | HR 72 | Ht 66.0 in | Wt >= 6400 oz

## 2024-08-27 DIAGNOSIS — M7989 Other specified soft tissue disorders: Secondary | ICD-10-CM

## 2024-08-27 DIAGNOSIS — G4733 Obstructive sleep apnea (adult) (pediatric): Secondary | ICD-10-CM

## 2024-08-27 DIAGNOSIS — R072 Precordial pain: Secondary | ICD-10-CM

## 2024-08-27 DIAGNOSIS — E662 Morbid (severe) obesity with alveolar hypoventilation: Secondary | ICD-10-CM

## 2024-08-27 DIAGNOSIS — E669 Obesity, unspecified: Secondary | ICD-10-CM | POA: Diagnosis not present

## 2024-08-27 DIAGNOSIS — G500 Trigeminal neuralgia: Secondary | ICD-10-CM

## 2024-08-27 MED ORDER — METOPROLOL SUCCINATE ER 25 MG PO TB24: 12.5000 mg | ORAL_TABLET | Freq: Every evening | ORAL | 1 refills | Status: AC

## 2024-08-27 NOTE — Patient Instructions (Addendum)
 Medication Instructions:  Your physician has recommended you make the following change in your medication:   START Toprol  XL 25 mg taking 1/2 tablet nightly  *If you need a refill on your cardiac medications before your next appointment, please call your pharmacy*  Lab Work: None ordered  If you have labs (blood work) drawn today and your tests are completely normal, you will receive your results only by: MyChart Message (if you have MyChart) OR A paper copy in the mail If you have any lab test that is abnormal or we need to change your treatment, we will call you to review the results.  Testing/Procedures: Your physician has requested that you have an echocardiogram. Echocardiography is a painless test that uses sound waves to create images of your heart. It provides your doctor with information about the size and shape of your heart and how well your hearts chambers and valves are working. This procedure takes approximately one hour. There are no restrictions for this procedure. Please do NOT wear cologne, perfume, aftershave, or lotions (deodorant is allowed). Please arrive 15 minutes prior to your appointment time.  Please note: We ask at that you not bring children with you during ultrasound (echo/ vascular) testing. Due to room size and safety concerns, children are not allowed in the ultrasound rooms during exams. Our front office staff cannot provide observation of children in our lobby area while testing is being conducted. An adult accompanying a patient to their appointment will only be allowed in the ultrasound room at the discretion of the ultrasound technician under special circumstances. We apologize for any inconvenience.   Follow-Up: At [facility], you and your health needs are our priority.  As part of our continuing mission to provide you with exceptional heart care, our providers are all part of one team.  This team includes your primary Cardiologist (physician)  and Advanced Practice Providers or APPs (Physician Assistants and Nurse Practitioners) who all work together to provide you with the care you need, when you need it.  Your next appointment:   6 week(s)  Provider:   Darryle ONEIDA Decent, MD    We recommend signing up for the patient portal called MyChart.  Sign up information is provided on this After Visit Summary.  MyChart is used to connect with patients for Virtual Visits (Telemedicine).  Patients are able to view lab/test results, encounter notes, upcoming appointments, etc.  Non-urgent messages can be sent to your provider as well.   To learn more about what you can do with MyChart, go to [email or web address].   Other Instructions

## 2024-09-09 ENCOUNTER — Other Ambulatory Visit: Payer: Self-pay

## 2024-09-21 ENCOUNTER — Ambulatory Visit (HOSPITAL_BASED_OUTPATIENT_CLINIC_OR_DEPARTMENT_OTHER)

## 2024-10-27 ENCOUNTER — Ambulatory Visit: Admitting: Cardiovascular Disease
